# Patient Record
Sex: Male | Born: 1950 | Race: White | Hispanic: No | Smoking: Former smoker
Health system: Southern US, Community
[De-identification: ages and names within clinical notes are randomized; demographics above are authoritative.]

## PROBLEM LIST (undated history)

## (undated) DIAGNOSIS — K219 Gastro-esophageal reflux disease without esophagitis: Secondary | ICD-10-CM

## (undated) DIAGNOSIS — I700 Atherosclerosis of aorta: Secondary | ICD-10-CM

## (undated) DIAGNOSIS — K449 Diaphragmatic hernia without obstruction or gangrene: Secondary | ICD-10-CM

## (undated) DIAGNOSIS — K259 Gastric ulcer, unspecified as acute or chronic, without hemorrhage or perforation: Secondary | ICD-10-CM

## (undated) DIAGNOSIS — J90 Pleural effusion, not elsewhere classified: Secondary | ICD-10-CM

## (undated) DIAGNOSIS — J439 Emphysema, unspecified: Secondary | ICD-10-CM

## (undated) DIAGNOSIS — J45909 Unspecified asthma, uncomplicated: Secondary | ICD-10-CM

## (undated) DIAGNOSIS — K269 Duodenal ulcer, unspecified as acute or chronic, without hemorrhage or perforation: Secondary | ICD-10-CM

## (undated) DIAGNOSIS — K220 Achalasia of cardia: Secondary | ICD-10-CM

## (undated) DIAGNOSIS — K635 Polyp of colon: Secondary | ICD-10-CM

## (undated) HISTORY — DX: Pleural effusion, not elsewhere classified: J90

## (undated) HISTORY — DX: Diaphragmatic hernia without obstruction or gangrene: K44.9

## (undated) HISTORY — DX: Duodenal ulcer, unspecified as acute or chronic, without hemorrhage or perforation: K26.9

## (undated) HISTORY — DX: Polyp of colon: K63.5

## (undated) HISTORY — DX: Emphysema, unspecified: J43.9

## (undated) HISTORY — DX: Achalasia of cardia: K22.0

## (undated) HISTORY — DX: Atherosclerosis of aorta: I70.0

---

## 2012-07-13 ENCOUNTER — Emergency Department (HOSPITAL_COMMUNITY): Payer: PRIVATE HEALTH INSURANCE

## 2012-07-13 ENCOUNTER — Emergency Department (HOSPITAL_COMMUNITY)
Admission: EM | Admit: 2012-07-13 | Discharge: 2012-07-13 | Disposition: A | Discharge status: Home / Self Care | Payer: PRIVATE HEALTH INSURANCE | Attending: Emergency Medicine | Admitting: Emergency Medicine

## 2012-07-13 ENCOUNTER — Encounter (HOSPITAL_COMMUNITY): Payer: Self-pay

## 2012-07-13 ENCOUNTER — Emergency Department (INDEPENDENT_AMBULATORY_CARE_PROVIDER_SITE_OTHER)
Admission: EM | Admit: 2012-07-13 | Discharge: 2012-07-13 | Disposition: A | Discharge status: Nursing Home (Includes ICF) | Payer: PRIVATE HEALTH INSURANCE | Source: Home / Self Care | Attending: Emergency Medicine | Admitting: Emergency Medicine

## 2012-07-13 ENCOUNTER — Encounter (HOSPITAL_COMMUNITY): Payer: Self-pay | Admitting: Emergency Medicine

## 2012-07-13 DIAGNOSIS — K625 Hemorrhage of anus and rectum: Secondary | ICD-10-CM

## 2012-07-13 DIAGNOSIS — K220 Achalasia of cardia: Secondary | ICD-10-CM | POA: Insufficient documentation

## 2012-07-13 DIAGNOSIS — K2970 Gastritis, unspecified, without bleeding: Secondary | ICD-10-CM | POA: Insufficient documentation

## 2012-07-13 DIAGNOSIS — R109 Unspecified abdominal pain: Secondary | ICD-10-CM

## 2012-07-13 DIAGNOSIS — R160 Hepatomegaly, not elsewhere classified: Secondary | ICD-10-CM

## 2012-07-13 DIAGNOSIS — K6289 Other specified diseases of anus and rectum: Secondary | ICD-10-CM

## 2012-07-13 HISTORY — DX: Gastric ulcer, unspecified as acute or chronic, without hemorrhage or perforation: K25.9

## 2012-07-13 LAB — APTT: aPTT: 33 seconds (ref 24–37)

## 2012-07-13 LAB — URINALYSIS, ROUTINE W REFLEX MICROSCOPIC
Hgb urine dipstick: NEGATIVE
Ketones, ur: 40 mg/dL — AB
Protein, ur: 30 mg/dL — AB
Urobilinogen, UA: 1 mg/dL (ref 0.0–1.0)

## 2012-07-13 LAB — URINE MICROSCOPIC-ADD ON

## 2012-07-13 LAB — COMPREHENSIVE METABOLIC PANEL
Alkaline Phosphatase: 59 U/L (ref 39–117)
BUN: 17 mg/dL (ref 6–23)
Chloride: 88 mEq/L — ABNORMAL LOW (ref 96–112)
GFR calc Af Amer: >90 mL/min (ref >90)
Glucose, Bld: 105 mg/dL — ABNORMAL HIGH (ref 70–99)
Potassium: 3.6 mEq/L (ref 3.5–5.1)
Total Bilirubin: 0.9 mg/dL (ref 0.3–1.2)

## 2012-07-13 LAB — CBC WITH DIFFERENTIAL/PLATELET
Eosinophils Absolute: 0.2 10*3/uL (ref 0.0–0.7)
HCT: 38.5 % — ABNORMAL LOW (ref 39.0–52.0)
Hemoglobin: 13.9 g/dL (ref 13.0–17.0)
Lymphs Abs: 2.2 10*3/uL (ref 0.7–4.0)
MCH: 29.7 pg (ref 26.0–34.0)
Monocytes Absolute: 1.6 10*3/uL — ABNORMAL HIGH (ref 0.1–1.0)
Monocytes Relative: 9 % (ref 3–12)
Neutro Abs: 15.1 10*3/uL — ABNORMAL HIGH (ref 1.7–7.7)
Neutrophils Relative %: 79 % — ABNORMAL HIGH (ref 43–77)
RBC: 4.68 MIL/uL (ref 4.22–5.81)

## 2012-07-13 LAB — LIPASE, BLOOD: Lipase: 15 U/L (ref 11–59)

## 2012-07-13 MED ORDER — FAMOTIDINE 20 MG PO TABS: 20.0000 mg | ORAL_TABLET | Freq: Two times a day (BID) | ORAL | Status: DC

## 2012-07-13 MED ORDER — OMEPRAZOLE 20 MG PO CPDR: 20.0000 mg | DELAYED_RELEASE_CAPSULE | Freq: Every day | ORAL | Status: DC

## 2012-07-13 MED ORDER — ONDANSETRON HCL 4 MG/2ML IJ SOLN
4.0000 mg | Freq: Once | INTRAMUSCULAR | Status: AC
  Administered 2012-07-13: 4 mg via INTRAVENOUS
  Filled 2012-07-13: qty 2

## 2012-07-13 MED ORDER — SODIUM CHLORIDE 0.9 % IV BOLUS (SEPSIS)
1000.0000 mL | Freq: Once | INTRAVENOUS | Status: AC
  Administered 2012-07-13: 1000 mL via INTRAVENOUS

## 2012-07-13 MED ORDER — HYDROCODONE-ACETAMINOPHEN 5-500 MG PO TABS: 1.0000 | ORAL_TABLET | Freq: Four times a day (QID) | ORAL | Status: DC | PRN

## 2012-07-13 MED ORDER — MORPHINE SULFATE 4 MG/ML IJ SOLN
4.0000 mg | Freq: Once | INTRAMUSCULAR | Status: DC
  Filled 2012-07-13: qty 1

## 2012-07-13 MED ORDER — PANTOPRAZOLE SODIUM 40 MG IV SOLR
40.0000 mg | Freq: Once | INTRAVENOUS | Status: AC
  Administered 2012-07-13: 40 mg via INTRAVENOUS
  Filled 2012-07-13: qty 40

## 2012-07-13 NOTE — ED Provider Notes (Addendum)
A will and is a 1 and a is an old or a History     CSN: [id number]  Arrival date & time 07/13/12  1219   First MD Initiated Contact with Patient 07/13/12 1240      Chief Complaint  Patient presents with  . Abdominal Pain    (Consider location/radiation/quality/duration/timing/severity/associated sxs/prior treatment) HPI Comments: Patient presents urgent care today complaining of moderate to severe abdominal pain for about 8 days. He has been noticing blood in his stools when he finishes for several weeks is not all the time comes and goes. Patient describes he smokes daily and drinks vodka and beer daily. Have been trying to tolerate this discomfort present within the last 2-3 days this has become worse and now has a constant dull pain on his upper abdomen (points to right upper quadrant and epigastric region). Denies any fevers but does feel nauseous. Have not been eating for several days. 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 Was attributing the pain to his standing position. Patient does relate that in the 70s he was diagnosed with a large gastric ulcer through an endoscopic study.  Patient describes that he feels tired fatigued no appetite and had lost about 5 pounds within the last week.  The history is provided by the patient.    Past Medical History  Diagnosis Date  . Stomach ulcer     History reviewed. No pertinent past surgical history.  No family history on file.  History  Substance Use Topics  . Smoking status: Current Every Day Smoker -- 1.0 packs/day    Types: Cigarettes  . Smokeless tobacco: Not on file  . Alcohol Use: Yes      Review of Systems  Constitutional: Positive for activity change, appetite change, fatigue and unexpected weight change. Negative for fever and diaphoresis.  Respiratory: Negative for shortness of breath.   Gastrointestinal: Positive for nausea, abdominal pain, blood in stool and anal bleeding.  Negative for vomiting, diarrhea, constipation and rectal pain.  Neurological: Negative for dizziness.    Allergies  Review of patient's allergies indicates no known allergies.  Home Medications   Current Outpatient Rx  Name Route Sig Dispense Refill  . IBUPROFEN PO Oral Take by mouth.      BP 130/79  Pulse 64  Temp 98.1 F (36.7 C) (Oral)  Resp 18  SpO2 100%  Physical Exam  Nursing note and vitals reviewed. Constitutional: Vital signs are normal. He appears well-developed and well-nourished.  Non-toxic appearance. He does not have a sickly appearance. He does not appear ill. No distress.  HENT:  Head: Normocephalic.  Eyes: Right eye exhibits discharge. Left eye exhibits no discharge.  Neck: Neck supple.  Cardiovascular: Normal rate.  Exam reveals no gallop and no friction rub.   No murmur heard. Pulmonary/Chest: Effort normal and breath sounds normal.  Abdominal: Normal appearance. He exhibits no shifting dullness, no pulsatile liver, no abdominal bruit and no ascites. There is hepatomegaly. There is tenderness in the right upper quadrant and epigastric area. There is no rigidity, no rebound, no guarding and negative Murphy's sign. No hernia. Hernia confirmed negative in the ventral area.    Neurological: He is alert.  Skin: Skin is warm.    ED Course  Procedures (including critical care time)  Labs Reviewed - No data to display No results found.   No diagnosis found. Patient had a positive Hemoccult test at [facility] Patient had an EKG showing  ventricular rate of 55 beats per minute sinus bradycardia with a right axis deviation. No signs of acute or chronic ischemia and no with normal PR interval or QT.   MDM  Patient has been transfer stable to the emergency department possibly alcohol induced hepatobiliary disorder to be further evaluated in emergency department. To be considered for imaging studies and comprehensive labs. Patient does not have a PCP and has concerning  hepatomegaly and tenderness. Differential includes cirrhosis, pancreatitis, alcohol induced hepatitis, etc.        Jimmie Molly, MD 07/13/12 1403  Jimmie Molly, MD 07/13/12 1407

## 2012-07-13 NOTE — ED Notes (Signed)
Pt transferred from and Report received from [facility].  Pt having intermittent abdominal pain and tenderness. Pt has Hx. of peptic ulcer.

## 2012-07-13 NOTE — ED Notes (Signed)
Patient drinks one beer in the am three shots of vodka day and one beer at night for a long time.

## 2012-07-13 NOTE — ED Notes (Signed)
Patient states past week has not ate normally due to lack of appetite and pain. Lost approximately 5-10 pounds this week.

## 2012-07-13 NOTE — ED Provider Notes (Signed)
History     CSN: [id number]  Arrival date & time 07/13/12  1422   First MD Initiated Contact with Patient 07/13/12 1702      Chief Complaint  Patient presents with  . Chest Pain    (Consider location/radiation/quality/duration/timing/severity/associated sxs/prior treatment) HPI Comments: Otho Rodela is a 61 y.o. Male who presents with complaint of upper abdominal pain for about a week now. Stats pain comes and goes but most recently constant. Associated with nausea, loss of appetite. Pt denies emesis, no blood in stool, stools are brown in color. States has history of ulcers years ago. States he is an alcohol drinker, drinks every evening and one drink every morning. States has been doing that "for years." Denies any other medical problems. Does not have a PCP. Denies fever, chills, cough, chest pain, swellng in legs.   The history is provided by the patient.    Past Medical History  Diagnosis Date  . Stomach ulcer     No past surgical history on file.  No family history on file.  History  Substance Use Topics  . Smoking status: Current Every Day Smoker -- 1.0 packs/day    Types: Cigarettes  . Smokeless tobacco: Not on file  . Alcohol Use: Yes      Review of Systems  Constitutional: Negative for fever and chills.  HENT: Negative for neck pain and neck stiffness.   Respiratory: Negative for cough, chest tightness, shortness of breath and wheezing.   Cardiovascular: Positive for chest pain. Negative for palpitations and leg swelling.  Gastrointestinal: Positive for nausea and abdominal pain. Negative for vomiting, diarrhea, constipation and blood in stool.  Genitourinary: Negative for dysuria, urgency, frequency and flank pain.  Musculoskeletal: Negative.   Skin: Negative.   Neurological: Positive for weakness. Negative for dizziness and headaches.  Hematological: Negative for adenopathy. Does not bruise/bleed easily.    Allergies  Review of patient's allergies  indicates no known allergies.  Home Medications   Current Outpatient Rx  Name Route Sig Dispense Refill  . VITAMIN B 12 PO Oral Take 1 tablet by mouth daily.    . IBUPROFEN 200 MG PO TABS Oral Take 600 mg by mouth 2 (two) times daily.    Marland Kitchen FISH OIL PO Oral Take 1 capsule by mouth daily.      BP 130/82  Pulse 75  Temp 98 F (36.7 C) (Oral)  Resp 16  SpO2 100%  Physical Exam  Nursing note and vitals reviewed. Constitutional: He is oriented to person, place, and time. He appears well-developed.       Thin appearing  HENT:  Head: Normocephalic and atraumatic.  Eyes: Conjunctivae normal are normal.  Neck: Normal range of motion. Neck supple.  Cardiovascular: Normal rate, regular rhythm and normal heart sounds.   Pulmonary/Chest: Effort normal. No respiratory distress. He has no wheezes. He has no rales.  Abdominal: Soft. Bowel sounds are normal. He exhibits no distension. There is tenderness. There is no rebound and no guarding.       Epigastric tenderness.   Genitourinary:       Normal rectal exam except for an internal and non thrombosed not bleeding external hemorrhoid. Stool is brown in color  Musculoskeletal: He exhibits no edema.  Neurological: He is alert and oriented to person, place, and time.  Skin: Skin is warm and dry.  Psychiatric: He has a normal mood and affect.    ED Course  Procedures (including critical care time)      Results  for orders placed during the hospital encounter of 07/13/12  CBC WITH DIFFERENTIAL      Component Value Range   WBC 19.1 (*) 4.0 - 10.5 K/uL   RBC 4.68  4.22 - 5.81 MIL/uL   Hemoglobin 13.9  13.0 - 17.0 g/dL   HCT 16.1 (*) 09.6 - 04.5 %   MCV 82.3  78.0 - 100.0 fL   MCH 29.7  26.0 - 34.0 pg   MCHC 36.1 (*) 30.0 - 36.0 g/dL   RDW 40.9  81.1 - 91.4 %   Platelets 346  150 - 400 K/uL   Neutrophils Relative 79 (*) 43 - 77 %   Neutro Abs 15.1 (*) 1.7 - 7.7 K/uL   Lymphocytes Relative 11 (*) 12 - 46 %   Lymphs Abs 2.2  0.7 - 4.0  K/uL   Monocytes Relative 9  3 - 12 %   Monocytes Absolute 1.6 (*) 0.1 - 1.0 K/uL   Eosinophils Relative 1  0 - 5 %   Eosinophils Absolute 0.2  0.0 - 0.7 K/uL   Basophils Relative 0  0 - 1 %   Basophils Absolute 0.1  0.0 - 0.1 K/uL  COMPREHENSIVE METABOLIC PANEL      Component Value Range   Sodium 126 (*) 135 - 145 mEq/L   Potassium 3.6  3.5 - 5.1 mEq/L   Chloride 88 (*) 96 - 112 mEq/L   CO2 25  19 - 32 mEq/L   Glucose, Bld 105 (*) 70 - 99 mg/dL   BUN 17  6 - 23 mg/dL   Creatinine, Ser 7.82  0.50 - 1.35 mg/dL   Calcium 9.8  8.4 - 95.6 mg/dL   Total Protein 7.4  6.0 - 8.3 g/dL   Albumin 3.8  3.5 - 5.2 g/dL   AST 18  0 - 37 U/L   ALT 15  0 - 53 U/L   Alkaline Phosphatase 59  39 - 117 U/L   Total Bilirubin 0.9  0.3 - 1.2 mg/dL   GFR calc non Af Amer >90  >90 mL/min   GFR calc Af Amer >90  >90 mL/min  LIPASE, BLOOD      Component Value Range   Lipase 15  11 - 59 U/L  TROPONIN I      Component Value Range   Troponin I <0.30  <0.30 ng/mL  URINALYSIS, ROUTINE W REFLEX MICROSCOPIC      Component Value Range   Color, Urine YELLOW  YELLOW   APPearance CLEAR  CLEAR   Specific Gravity, Urine 1.031 (*) 1.005 - 1.030   pH 6.0  5.0 - 8.0   Glucose, UA NEGATIVE  NEGATIVE mg/dL   Hgb urine dipstick NEGATIVE  NEGATIVE   Bilirubin Urine SMALL (*) NEGATIVE   Ketones, ur 40 (*) NEGATIVE mg/dL   Protein, ur 30 (*) NEGATIVE mg/dL   Urobilinogen, UA 1.0  0.0 - 1.0 mg/dL   Nitrite NEGATIVE  NEGATIVE   Leukocytes, UA NEGATIVE  NEGATIVE  OCCULT BLOOD, POC DEVICE      Component Value Range   Fecal Occult Bld POSITIVE    URINE MICROSCOPIC-ADD ON      Component Value Range   WBC, UA 0-2  <3 WBC/hpf   RBC / HPF 0-2  <3 RBC/hpf   Casts HYALINE CASTS (*) NEGATIVE   Urine-Other MUCOUS PRESENT     Dg Chest 2 View  07/13/2012  *RADIOLOGY REPORT*  Clinical Data: Chest pain.  Upper abdominal pain.  CHEST -  2 VIEW  Comparison: 07/13/2012 abdomen radiograph  Findings: Markedly dilated esophagus with  air-fluid level noted.  I do not observe clips in the chest to suggest that the patient has alternatively had a gastric pull-through procedure.  Cardiac and mediastinal contours appear unremarkable.  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  IMPRESSION:  1. Large middle mediastinal structure with air fluid level likely representing a prominently dilated esophagus.  Appearance suspicious for achalasia. 2.  Suspected emphysema.   Original Report Authenticated By: Dellia Cloud, M.D.    Dg Abd 2 Views  07/13/2012  *RADIOLOGY REPORT*  Clinical Data: Upper abdominal pain.  ABDOMEN - 2 VIEW  Comparison: None.  Findings: Large gas and fluid filled structure in the middle mediastinum is partially included, suspicious for markedly dilated esophagus in the setting of achalasia.  Mild dextroconvex lumbar scoliosis is observed.  Equivocal hepatomegaly.  Unremarkable bowel gas pattern.  IMPRESSION:  1.  Dilated esophagus filled with gas and fluid, suspicious for achalasia. 2.  Dextroconvex lumbar scoliosis. 3.  Probable mild hepatomegaly.   Original Report Authenticated By: Dellia Cloud, M.D.      1. Gastritis   2. Achalasia       MDM  Pt with possible achalasia on x-rays, and i suspect some aspect of alcoholic gastritis. His LFTs and lipase are negative. Stool normal in color. Hemoglobin normal, hemoccult positive stool, however, Just a tiny blue spec on the development paper. Pt in NAD. VS normal. He is non toxic. Abdomen soft, no acute abdomen. No signs of perforated ulcer on acute abdomen. Will start on PPI, pepcid, follow up with GI for further evaluation. Instructed to return if worsening.         Lottie Mussel, PA 07/14/12 0104

## 2012-07-13 NOTE — ED Notes (Signed)
Pt sent here from [facility] for rib pain and abd pain, pt denies hx of pancreatitis but sts has been consuming etoh.

## 2012-07-13 NOTE — ED Notes (Signed)
Pt c/o of abd pain x8 days.... Believes it may be due to his stomach ulcers... Pain starts at the stomach radiating towards ribs and back... Not eating well or sleeping well due to the pain... Only able to keep fluids down and milk helps to relief some discomfort... Feeling nauseas and vomits when he does manage to eat... Denies: fever and diarrhea... 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

## 2012-07-14 NOTE — ED Provider Notes (Signed)
Medical screening examination/treatment/procedure(s) were performed by non-physician practitioner and as supervising physician I was immediately available for consultation/collaboration.  Flint Melter, MD 07/14/12 1312

## 2012-07-20 ENCOUNTER — Encounter: Payer: Self-pay | Admitting: Internal Medicine

## 2012-08-13 ENCOUNTER — Encounter: Payer: Self-pay | Admitting: Internal Medicine

## 2012-08-15 ENCOUNTER — Ambulatory Visit (INDEPENDENT_AMBULATORY_CARE_PROVIDER_SITE_OTHER): Payer: PRIVATE HEALTH INSURANCE | Admitting: Internal Medicine

## 2012-08-15 ENCOUNTER — Other Ambulatory Visit: Payer: Self-pay | Admitting: Gastroenterology

## 2012-08-15 ENCOUNTER — Encounter: Payer: Self-pay | Admitting: Internal Medicine

## 2012-08-15 VITALS — BP 104/66 | HR 84 | Ht 64.0 in | Wt 120.2 lb

## 2012-08-15 DIAGNOSIS — R933 Abnormal findings on diagnostic imaging of other parts of digestive tract: Secondary | ICD-10-CM

## 2012-08-15 DIAGNOSIS — R1013 Epigastric pain: Secondary | ICD-10-CM

## 2012-08-15 DIAGNOSIS — K279 Peptic ulcer, site unspecified, unspecified as acute or chronic, without hemorrhage or perforation: Secondary | ICD-10-CM

## 2012-08-15 DIAGNOSIS — Z1211 Encounter for screening for malignant neoplasm of colon: Secondary | ICD-10-CM

## 2012-08-15 DIAGNOSIS — R1319 Other dysphagia: Secondary | ICD-10-CM

## 2012-08-15 DIAGNOSIS — R1314 Dysphagia, pharyngoesophageal phase: Secondary | ICD-10-CM

## 2012-08-15 DIAGNOSIS — R1310 Dysphagia, unspecified: Secondary | ICD-10-CM

## 2012-08-15 MED ORDER — PEG-KCL-NACL-NASULF-NA ASC-C 100 G PO SOLR: 1.0000 | Freq: Once | ORAL | Status: DC

## 2012-08-15 MED ORDER — FAMOTIDINE 20 MG PO TABS: 20.0000 mg | ORAL_TABLET | Freq: Two times a day (BID) | ORAL | Status: DC

## 2012-08-15 NOTE — Patient Instructions (Addendum)
You have been given a separate informational sheet regarding your tobacco use, the importance of quitting and local resources to help you quit. You have been scheduled for a Endoscopy/colonoscopy with propofol. Please follow written instructions given to you at your visit today.  Please pick up your prep kit at the pharmacy within the next 1-3 days. If you use inhalers (even only as needed), please bring them with you on the day of your procedure. We have sent the following medications to your pharmacy for you to pick up at your convenience: moviprep, pepcid

## 2012-08-15 NOTE — Progress Notes (Signed)
Patient ID: Raymond Jacobs, male   DOB: [birth year], 61 y.o.   MRN: [id number]  SUBJECTIVE: HPI Raymond Jacobs is a 61 yo male with PMH of PUD who is seen in consultation at the request of Dr. Effie Shy (Urgent Care MD) for evaluation of epigastric abdominal pain, reflux and abnormal imaging.  The patient reports being seen in urgent care around 07/13/2012 for evaluation of bilateral rib and epigastric abdominal pain. This had been going on for several weeks but worsened and he sought treatment. At that time he was having some trouble swallowing both solid and liquid, but perhaps worse for liquids. Issues with dysphagia had been present on and off for a few "years".  He also is having some nausea but no vomiting. 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  He was given a prescription for famotidine 20 mg twice daily at that appointment and he has continue this as prescribed. He has significantly improved his epigastric abdominal pain as well as his rib pain. His nausea has resolved and he feels that his swallowing has improved. He still occasionally has epigastric pain but it is not as severe. He also is cut back on coffee and caffeine consumption which he thinks has helped. His bowel movements have been regular without diarrhea or constipation. He has seen scant red blood on the toilet tissue but he associated this with ibuprofen use, discontinued ibuprofen, and it resolved.  He's never had an endoscopic procedure, but did have what sounds like a barium swallow when he was 61 years old a diagnosed ulcer disease.  His brother has a history of colorectal cancer and had a partial colectomy.  Review of Systems  As per history of present illness, otherwise negative   Past Medical History  Diagnosis Date  . Stomach ulcer     Current Outpatient Prescriptions  Medication Sig Dispense Refill  . acetaminophen (TYLENOL) 500 MG tablet Take 500 mg by mouth every 6 (six) hours  as needed.      . Cyanocobalamin (VITAMIN B 12 PO) Take 1 tablet by mouth daily as needed.       . famotidine (PEPCID) 20 MG tablet Take 1 tablet (20 mg total) by mouth 2 (two) times daily.  30 tablet  0  . Omega-3 Fatty Acids (FISH OIL PO) Take 1 capsule by mouth daily as needed.       Marland Kitchen DISCONTD: famotidine (PEPCID) 20 MG tablet Take 1 tablet (20 mg total) by mouth 2 (two) times daily.  30 tablet  0  . peg 3350 powder (MOVIPREP) 100 G SOLR Take 1 kit (100 g total) by mouth once.  1 kit  0    No Known Allergies  Family History  Problem Relation Age of Onset  . Colon cancer Brother   . Diabetes Brother   . Diabetes Maternal Aunt     History  Substance Use Topics  . Smoking status: Current Every Day Smoker -- 1.0 packs/day    Types: Cigarettes  . Smokeless tobacco: Never Used   Comment: form given 08-15-12  . Alcohol Use: Yes     1 beer and 3 shots vodka daily    OBJECTIVE: BP 104/66  Pulse 84  Ht 5\' 4"  (1.626 m)  Wt 120 lb 3.2 oz (54.522 kg)  BMI 20.63 kg/m2 Constitutional: Well-developed and well-nourished. No distress. HEENT: Normocephalic and atraumatic. Oropharynx is clear and moist. No oropharyngeal exudate. Conjunctivae are normal. No scleral icterus. Neck: Neck  supple. Trachea midline. Cardiovascular: Normal rate, regular rhythm and intact distal pulses. No M/R/G Pulmonary/chest: Effort normal and breath sounds somewhat distant. No wheezing, rales or rhonchi. Abdominal: Soft, nontender, nondistended. Bowel sounds active throughout. There are no masses palpable. No hepatosplenomegaly. Extremities: no clubbing, cyanosis, or edema Lymphadenopathy: No cervical adenopathy noted. Neurological: Alert and oriented to person place and time. Skin: Skin is warm and dry. No rashes noted. Psychiatric: Normal mood and affect. Behavior is normal.  Labs and Imaging -- CBC    Component Value Date/Time   WBC 19.1* 07/13/2012 1502   RBC 4.68 07/13/2012 1502   HGB 13.9 07/13/2012  1502   HCT 38.5* 07/13/2012 1502   PLT 346 07/13/2012 1502   MCV 82.3 07/13/2012 1502   MCH 29.7 07/13/2012 1502   MCHC 36.1* 07/13/2012 1502   RDW 13.9 07/13/2012 1502   LYMPHSABS 2.2 07/13/2012 1502   MONOABS 1.6* 07/13/2012 1502   EOSABS 0.2 07/13/2012 1502   BASOSABS 0.1 07/13/2012 1502    CMP     Component Value Date/Time   NA 126* 07/13/2012 1502   K 3.6 07/13/2012 1502   CL 88* 07/13/2012 1502   CO2 25 07/13/2012 1502   GLUCOSE 105* 07/13/2012 1502   BUN 17 07/13/2012 1502   CREATININE 0.60 07/13/2012 1502   CALCIUM 9.8 07/13/2012 1502   PROT 7.4 07/13/2012 1502   ALBUMIN 3.8 07/13/2012 1502   AST 18 07/13/2012 1502   ALT 15 07/13/2012 1502   ALKPHOS 59 07/13/2012 1502   BILITOT 0.9 07/13/2012 1502   GFRNONAA >90 07/13/2012 1502   GFRAA >90 07/13/2012 1502   Imaging from 07/13/2012 --  Clinical Data: Chest pain.  Upper abdominal pain.   CHEST - 2 VIEW   Comparison: 07/13/2012 abdomen radiograph   Findings: Markedly dilated esophagus with air-fluid level noted.  I do not observe clips in the chest to suggest that the patient has alternatively had a gastric pull-through procedure.   Cardiac and mediastinal contours appear unremarkable.   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   IMPRESSION:   1. Large middle mediastinal structure with air fluid level likely representing a prominently dilated esophagus.  Appearance suspicious for achalasia. 2.  Suspected emphysema. Clinical Data: Upper abdominal pain.   ABDOMEN - 2 VIEW   Comparison: None.   Findings: Large gas and fluid filled structure in the middle mediastinum is partially included, suspicious for markedly dilated esophagus in the setting of achalasia.   Mild dextroconvex lumbar scoliosis is observed.  Equivocal hepatomegaly.  Unremarkable bowel gas pattern.   IMPRESSION:   1.  Dilated esophagus filled with gas and fluid, suspicious for achalasia. 2.  Dextroconvex lumbar  scoliosis. 3.  Probable mild hepatomegaly.  ASSESSMENT AND PLAN: 61 yo male with PMH of PUD who is seen in consultation at the request of Dr. Effie Shy (Urgent Care MD) for evaluation of epigastric abdominal pain, reflux and abnormal imaging  1.  GERD/epigastric pain/abnl imaging -- patient has had a favorable response to the famotidine 20 mg twice a day. However given his long-term symptoms of dysphagia and abnormal imaging, I recommended direct visualization with upper endoscopy. The chest x-ray showed a dilated esophagus which is suspicious for achalasia.  I will determine the need for manometry after her EGD. Her now he'll continue famotidine 20 mg twice daily. I've asked that he continue to avoid NSAIDs.  2.  CRC screening/scant rectal bleeding (resolved) -- given the patient's age, the fact that he has never had  colonoscopy, and his brothers history recommended screening colonoscopy today. We discussed the risk and benefits of both procedures and he is agreeable to proceed. We will be performed on the same day.  Further recommendations after procedure. He did have a hyponatremia and leukocytosis on September 20 at his urgent care visit and we will recheck these labs.

## 2012-08-21 ENCOUNTER — Ambulatory Visit (AMBULATORY_SURGERY_CENTER): Payer: PRIVATE HEALTH INSURANCE | Admitting: Internal Medicine

## 2012-08-21 ENCOUNTER — Encounter: Payer: Self-pay | Admitting: Internal Medicine

## 2012-08-21 VITALS — BP 122/83 | HR 68 | Temp 97.9°F | Resp 12 | Ht 64.0 in | Wt 120.0 lb

## 2012-08-21 DIAGNOSIS — K209 Esophagitis, unspecified: Secondary | ICD-10-CM

## 2012-08-21 DIAGNOSIS — D126 Benign neoplasm of colon, unspecified: Secondary | ICD-10-CM

## 2012-08-21 DIAGNOSIS — R1314 Dysphagia, pharyngoesophageal phase: Secondary | ICD-10-CM

## 2012-08-21 DIAGNOSIS — K2980 Duodenitis without bleeding: Secondary | ICD-10-CM

## 2012-08-21 DIAGNOSIS — K2970 Gastritis, unspecified, without bleeding: Secondary | ICD-10-CM

## 2012-08-21 DIAGNOSIS — R933 Abnormal findings on diagnostic imaging of other parts of digestive tract: Secondary | ICD-10-CM

## 2012-08-21 DIAGNOSIS — K635 Polyp of colon: Secondary | ICD-10-CM

## 2012-08-21 DIAGNOSIS — Z1211 Encounter for screening for malignant neoplasm of colon: Secondary | ICD-10-CM

## 2012-08-21 DIAGNOSIS — R1310 Dysphagia, unspecified: Secondary | ICD-10-CM

## 2012-08-21 MED ORDER — ESOMEPRAZOLE MAGNESIUM 40 MG PO PACK: 40.0000 mg | PACK | Freq: Two times a day (BID) | ORAL | Status: DC

## 2012-08-21 MED ORDER — SODIUM CHLORIDE 0.9 % IV SOLN: 500.0000 mL | INTRAVENOUS | Status: DC

## 2012-08-21 NOTE — Op Note (Signed)
[facility] [address] [place], [address]   ENDOSCOPY PROCEDURE REPORT  PATIENT: Raymond Jacobs, Raymond Jacobs  MR#: [id number] BIRTHDATE: [birth year] , 61  yrs. old GENDER: Male ENDOSCOPIST: Beverley Fiedler, MD REFERRED BY: PROCEDURE DATE:  08/21/2012 PROCEDURE:  EGD w/ biopsy ASA CLASS:     Class II INDICATIONS:  epigastric pain.   heartburn.   dysphagia.   abnormal chest X-ray. MEDICATIONS: MAC sedation, administered by CRNA and Propofol (Diprivan) 260 mg IV TOPICAL ANESTHETIC: Cetacaine Spray  DESCRIPTION OF PROCEDURE: After the risks benefits and alternatives of the procedure were thoroughly explained, informed consent was obtained.  The LB-GIF Q180 [id number] endoscope was introduced through the mouth and advanced to the second portion of the duodenum. Without limitations.  The instrument was slowly withdrawn as the mucosa was fully examined.    ESOPHAGUS:   Moderate to severe dilation throughout the entire course of the esophagus. The esophagus was fluid-filled and time was taken to aspirate all fluid.  The esophagus had mild and inflammatory changes and mucosal sloughing , likely as a consequence of stasis. Biopsies were performed with cold forceps. The  Z line was regular and located 40 cm from the incisors. Moderate resistance was encountered with passage of the scope across the GE junction/LES.  STOMACH: Moderate gastritis (inflammation) with hemorrhage was found in the gastric body and gastric antrum.  Biopsies were taken in the gastric body, antrum and angularis.  DUODENUM: Moderate duodenal inflammation was found in the duodenal bulb.   The duodenal mucosa showed no abnormalities in the 2nd part of the duodenum.  Retroflexed views revealed no abnormalities. The scope was then withdrawn from the patient and the procedure completed.  COMPLICATIONS: There were no complications.  ENDOSCOPIC IMPRESSION:  1.    Dilated and fluid-filled esophagus with  increased tone at the GE junction consistent with achalasia 2.   Gastritis (inflammation) with hemorrhage was found in the gastric body and gastric antrum; biopsies were taken in the antrum and angularis 2.   Duodenal inflammation was found in the duodenal bulb 3.   The duodenal mucosa showed no abnormalities in the 2nd part of the duodenum  RECOMMENDATIONS: 1.  Await pathology results 2.  Begin Nexium packets 40 mg twice daily for acid suppression 3.  Referral to [facility], Dr.  Abbey Chatters for evaluation of achalasia and consideration of Heller myotomy  eSigned:  Beverley Fiedler, MD 08/21/2012 3:58 PM   CC:  PATIENT NAME:  Raymond Jacobs, Raymond Jacobs MR#: [id number]

## 2012-08-21 NOTE — Progress Notes (Signed)
Patient did not experience any of the following events: a burn prior to discharge; a fall within the facility; wrong site/side/patient/procedure/implant event; or a hospital transfer or hospital admission upon discharge from the facility. (G8907) Patient did not have preoperative order for IV antibiotic SSI prophylaxis. (G8918)  

## 2012-08-21 NOTE — Patient Instructions (Addendum)

## 2012-08-21 NOTE — Op Note (Signed)
[facility] [address] [place], [address]   COLONOSCOPY PROCEDURE REPORT  PATIENT: Raymond, Jacobs  MR#: [id number] BIRTHDATE: 1951-03-20 , 61  yrs. old GENDER: Male ENDOSCOPIST: Beverley Fiedler, MD PROCEDURE DATE:  08/21/2012 PROCEDURE:   Colonoscopy with snare polypectomy and Colonoscopy with cold biopsy polypectomy ASA CLASS:   Class II INDICATIONS:elevated risk screening and patient's immediate family history of colon cancer. MEDICATIONS: MAC sedation, administered by CRNA, Propofol (Diprivan), and Propofol (Diprivan) 120 mg IV  DESCRIPTION OF PROCEDURE:   After the risks benefits and alternatives of the procedure were thoroughly explained, informed consent was obtained.  A digital rectal exam revealed no rectal mass.   The LB CF-[id number]  endoscope was introduced through the anus and advanced to the cecum, which was identified by both the appendix and ileocecal valve. No adverse events experienced. The quality of the prep was good, using MoviPrep  The instrument was then slowly withdrawn as the colon was fully examined.    COLON FINDINGS: Two sessile polyps ranging between 3-81mm in size were found in the sigmoid colon.  Polypectomy was performed using cold snare and with cold forceps.  All resections were complete and all polyp tissue was completely retrieved.   Mild diverticulosis was noted in the sigmoid colon.  Retroflexed views revealed no abnormalities. The time to cecum=6 minutes 56 seconds.  Withdrawal time=10 minutes 33 seconds.  The scope was withdrawn and the procedure completed. COMPLICATIONS: There were no complications.  ENDOSCOPIC IMPRESSION: 1.   Two sessile polyps ranging between 3-42mm in size were found in the sigmoid colon; Polypectomy was performed using cold snare and with cold forceps 2.   Mild diverticulosis was noted in the sigmoid colon  RECOMMENDATIONS: 1.  Await pathology results 2.  High fiber diet 3.  If the  polyps removed today are proven to be adenomatous (pre-cancerous) polyps, you will need a repeat colonoscopy in 5 years.  Otherwise you should continue to follow colorectal cancer screening guidelines for "routine risk" patients with colonoscopy in 10 years.  You will receive a letter within 1-2 weeks with the results of your biopsy as well as final recommendations.  Please call my office if you have not received a letter after 3 weeks.   eSigned:  Beverley Fiedler, MD 08/21/2012 4:00 PM  cc: The Patient   PATIENT NAME:  Raymond, Jacobs MR#: [id number]

## 2012-08-21 NOTE — Progress Notes (Signed)
VSS, A&O, report to RN 

## 2012-08-22 ENCOUNTER — Telehealth: Payer: Self-pay | Admitting: *Deleted

## 2012-08-22 NOTE — Telephone Encounter (Signed)
  Follow up Call-  Call back number 08/21/2012  Post procedure Call Back phone  # [phone]  Permission to leave phone message No    Voicemail not set up yet; not able to leave message

## 2012-08-28 ENCOUNTER — Encounter: Payer: Self-pay | Admitting: Internal Medicine

## 2012-08-28 ENCOUNTER — Telehealth: Payer: Self-pay | Admitting: *Deleted

## 2012-08-28 DIAGNOSIS — K220 Achalasia of cardia: Secondary | ICD-10-CM

## 2012-08-28 NOTE — Telephone Encounter (Signed)
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 Sent note to [place] at [facility] for an appt.

## 2012-08-29 NOTE — Telephone Encounter (Signed)
Patient scheduled to see Dr. Avel Peace on 09/26/12 @ 9:20am, arrive @ 8:50am. If you have any questions please call [phone].   Spoke with pt to inform him I have his appt with a surgeon and he stated he wanted to wait on the surgeon's consultation and the surgery. Pt states he was starting to feel when he came to see Dr Rhea Belton, but he thought he would continue with the evaluation. He states he's improving every day; he's moving his ladders,working and riding his bicycle. He also reports he did not purchase Nexium because it's too expensive and he feels he doesn't need it. He uses Pepcid or Prilosec about QOD.  Cancelled appt with Dr Abbey Chatters.

## 2012-08-30 DIAGNOSIS — K220 Achalasia of cardia: Secondary | ICD-10-CM | POA: Insufficient documentation

## 2012-08-30 NOTE — Telephone Encounter (Signed)
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 I'm not surprised that acid suppression helps his symptoms. While I feel that he needs to see surgery about this issue, we cannot force him to do so. He can followup with [place] if he has symptoms of dysphagia, odynophagia, or dyspeptic symptoms

## 2012-09-26 ENCOUNTER — Ambulatory Visit (INDEPENDENT_AMBULATORY_CARE_PROVIDER_SITE_OTHER): Payer: PRIVATE HEALTH INSURANCE | Admitting: General Surgery

## 2012-10-22 ENCOUNTER — Ambulatory Visit (INDEPENDENT_AMBULATORY_CARE_PROVIDER_SITE_OTHER): Payer: Self-pay | Admitting: General Surgery

## 2013-05-16 ENCOUNTER — Emergency Department (INDEPENDENT_AMBULATORY_CARE_PROVIDER_SITE_OTHER)
Admission: EM | Admit: 2013-05-16 | Discharge: 2013-05-16 | Disposition: A | Discharge status: Home / Self Care | Payer: PRIVATE HEALTH INSURANCE | Source: Home / Self Care

## 2013-05-16 ENCOUNTER — Encounter (HOSPITAL_COMMUNITY): Payer: Self-pay | Admitting: Emergency Medicine

## 2013-05-16 DIAGNOSIS — L253 Unspecified contact dermatitis due to other chemical products: Secondary | ICD-10-CM

## 2013-05-16 MED ORDER — CLOBETASOL PROPIONATE 0.05 % EX OINT: TOPICAL_OINTMENT | Freq: Two times a day (BID) | CUTANEOUS | Status: DC

## 2013-05-16 MED ORDER — LANOLIN EX OINT: TOPICAL_OINTMENT | CUTANEOUS | Status: DC | PRN

## 2013-05-16 NOTE — ED Notes (Addendum)
Pt's feet are soaking in sterile water at this time, to aid in the removal of his bandages.

## 2013-05-16 NOTE — ED Notes (Signed)
Patient reports he was pressure washing a house with clorox solution and feet and hands were not protected.  Since then he has had issues.  Hands /wrists are red.  Bilateral feet are swollen, red, and splotchy white tissue.  Bilateral feet weeping.  Odor noted.  Patient denies pain.  Patient has been using neosporin on wounds.  Bilateral pedal pulses present.

## 2013-05-16 NOTE — ED Provider Notes (Signed)
Medical screening examination/treatment/procedure(s) were performed by resident physician or non-physician practitioner and as supervising physician I was immediately available for consultation/collaboration.   Barkley Bruns MD.   Linna Hoff, MD 05/16/13 [phone]

## 2013-05-16 NOTE — ED Provider Notes (Signed)
CSN: [id number]     Arrival date & time 05/16/13  1511 History     None    Chief Complaint  Patient presents with  . 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 He has since developed erythema and pitting edema to see as well as a dermatitis. Hands are also affected.   Past Medical History  Diagnosis Date  . Stomach ulcer    History reviewed. No pertinent past surgical history. Family History  Problem Relation Age of Onset  . Colon cancer Brother   . Diabetes Brother   . Diabetes Maternal Aunt    History  Substance Use Topics  . Smoking status: Current Every Day Smoker -- 1.00 packs/day    Types: Cigarettes  . Smokeless tobacco: Never Used     Comment: form given 08-15-12  . Alcohol Use: Yes     Comment: 1 beer and 3 shots vodka daily    Review of Systems  Skin: Positive for rash.       As per history of present illness  All other systems reviewed and are negative.    Allergies  Review of patient's allergies indicates no known allergies.  Home Medications   Current Outpatient Rx  Name  Route  Sig  Dispense  Refill  . ibuprofen (ADVIL,MOTRIN) 200 MG tablet   Oral   Take 200 mg by mouth every 6 (six) hours as needed for pain.         Marland Kitchen acetaminophen (TYLENOL) 500 MG tablet   Oral   Take 500 mg by mouth every 6 (six) hours as needed.         . clobetasol ointment (TEMOVATE) 0.05 %   Topical   Apply topically 2 (two) times daily.   30 g   1   . Cyanocobalamin (VITAMIN B 12 PO)   Oral   Take 1 tablet by mouth daily as needed.          Marland Kitchen esomeprazole (NEXIUM) 40 MG packet   Oral   Take 40 mg by mouth 2 (two) times daily.   30 each   2   . famotidine (PEPCID) 20 MG tablet   Oral   Take 1 tablet (20  mg total) by mouth 2 (two) times daily.   30 tablet   0   . lanolin ointment   Topical   Apply topically as needed for dry skin.   454 g   0   . Omega-3 Fatty Acids (FISH OIL PO)   Oral   Take 1 capsule by mouth daily as needed.           BP 102/66  Pulse 82  Temp(Src) 97.5 F (36.4 C) (Oral)  Resp 18  SpO2 98% Physical Exam  Nursing note and vitals reviewed. Constitutional: He is oriented to person, place, and time. He appears well-developed and well-nourished. No distress.  Pulmonary/Chest: Effort normal.  Neurological: He is alert and oriented to person, place, and time.  Skin:  Erythema, lichenification, skin fissures and mild swelling to both hands palmar and dorsal aspect.  There is mild erythema to the dorsum of the feet with 1+ pitting edema to just above the ankles. The  affected area stops at the level of the socks. the toes and plantar aspect of the feet are edematous and erythematous.  There is also lichenification and fissures in these areas as well. Does not appear to have areas of infection. No lymphangitis. Erythema appears to be due to the dermatitis affect.   Psychiatric: He has a normal mood and affect.    ED Course   Procedures (including critical care time)  Labs Reviewed - No data to display No results found. 1. Dermatitis, chemical     MDM  Wash feet in running water daily to clear old tissue Apply the clobetasol oint Apply lanolin oint daily RTO 1 week Prior to going home with a wrap both feet and clean and he is to take going off when he gets home to help remove some of the tissue bandage and any desquamating skin as well. Initial treat as above.  Hayden Rasmussen, NP 05/16/13 [phone]

## 2018-03-30 ENCOUNTER — Emergency Department (HOSPITAL_COMMUNITY): Payer: Medicare Other

## 2018-03-30 ENCOUNTER — Other Ambulatory Visit: Payer: Self-pay

## 2018-03-30 ENCOUNTER — Encounter (HOSPITAL_COMMUNITY): Payer: Self-pay | Admitting: *Deleted

## 2018-03-30 ENCOUNTER — Inpatient Hospital Stay (HOSPITAL_COMMUNITY)
Admission: EM | Admit: 2018-03-30 | Discharge: 2018-04-13 | DRG: 380 | Disposition: A | Discharge status: Home Health | Payer: Medicare Other | Attending: Surgery | Admitting: Surgery

## 2018-03-30 DIAGNOSIS — R0603 Acute respiratory distress: Secondary | ICD-10-CM | POA: Diagnosis not present

## 2018-03-30 DIAGNOSIS — R443 Hallucinations, unspecified: Secondary | ICD-10-CM | POA: Diagnosis not present

## 2018-03-30 DIAGNOSIS — Z8711 Personal history of peptic ulcer disease: Secondary | ICD-10-CM | POA: Diagnosis not present

## 2018-03-30 DIAGNOSIS — E43 Unspecified severe protein-calorie malnutrition: Secondary | ICD-10-CM | POA: Diagnosis present

## 2018-03-30 DIAGNOSIS — K265 Chronic or unspecified duodenal ulcer with perforation: Secondary | ICD-10-CM | POA: Diagnosis present

## 2018-03-30 DIAGNOSIS — K0889 Other specified disorders of teeth and supporting structures: Secondary | ICD-10-CM | POA: Diagnosis present

## 2018-03-30 DIAGNOSIS — J939 Pneumothorax, unspecified: Secondary | ICD-10-CM | POA: Diagnosis not present

## 2018-03-30 DIAGNOSIS — R000 Tachycardia, unspecified: Secondary | ICD-10-CM | POA: Diagnosis present

## 2018-03-30 DIAGNOSIS — J9809 Other diseases of bronchus, not elsewhere classified: Secondary | ICD-10-CM | POA: Diagnosis present

## 2018-03-30 DIAGNOSIS — J80 Acute respiratory distress syndrome: Secondary | ICD-10-CM

## 2018-03-30 DIAGNOSIS — E876 Hypokalemia: Secondary | ICD-10-CM | POA: Diagnosis not present

## 2018-03-30 DIAGNOSIS — Z791 Long term (current) use of non-steroidal anti-inflammatories (NSAID): Secondary | ICD-10-CM

## 2018-03-30 DIAGNOSIS — J9601 Acute respiratory failure with hypoxia: Secondary | ICD-10-CM | POA: Diagnosis present

## 2018-03-30 DIAGNOSIS — F17210 Nicotine dependence, cigarettes, uncomplicated: Secondary | ICD-10-CM | POA: Diagnosis present

## 2018-03-30 DIAGNOSIS — E871 Hypo-osmolality and hyponatremia: Secondary | ICD-10-CM | POA: Diagnosis not present

## 2018-03-30 DIAGNOSIS — J9600 Acute respiratory failure, unspecified whether with hypoxia or hypercapnia: Secondary | ICD-10-CM

## 2018-03-30 DIAGNOSIS — K2970 Gastritis, unspecified, without bleeding: Secondary | ICD-10-CM | POA: Diagnosis present

## 2018-03-30 DIAGNOSIS — J181 Lobar pneumonia, unspecified organism: Secondary | ICD-10-CM | POA: Diagnosis present

## 2018-03-30 DIAGNOSIS — J432 Centrilobular emphysema: Secondary | ICD-10-CM | POA: Diagnosis present

## 2018-03-30 DIAGNOSIS — J189 Pneumonia, unspecified organism: Secondary | ICD-10-CM

## 2018-03-30 DIAGNOSIS — J90 Pleural effusion, not elsewhere classified: Secondary | ICD-10-CM | POA: Diagnosis present

## 2018-03-30 DIAGNOSIS — R1084 Generalized abdominal pain: Secondary | ICD-10-CM | POA: Diagnosis present

## 2018-03-30 DIAGNOSIS — J449 Chronic obstructive pulmonary disease, unspecified: Secondary | ICD-10-CM | POA: Diagnosis not present

## 2018-03-30 DIAGNOSIS — Z6822 Body mass index (BMI) 22.0-22.9, adult: Secondary | ICD-10-CM

## 2018-03-30 DIAGNOSIS — J869 Pyothorax without fistula: Secondary | ICD-10-CM | POA: Diagnosis present

## 2018-03-30 DIAGNOSIS — Z800 Family history of malignant neoplasm of digestive organs: Secondary | ICD-10-CM

## 2018-03-30 DIAGNOSIS — J9811 Atelectasis: Secondary | ICD-10-CM | POA: Diagnosis present

## 2018-03-30 DIAGNOSIS — J811 Chronic pulmonary edema: Secondary | ICD-10-CM | POA: Diagnosis present

## 2018-03-30 DIAGNOSIS — Z886 Allergy status to analgesic agent status: Secondary | ICD-10-CM | POA: Diagnosis not present

## 2018-03-30 DIAGNOSIS — R188 Other ascites: Secondary | ICD-10-CM | POA: Diagnosis present

## 2018-03-30 DIAGNOSIS — K651 Peritoneal abscess: Secondary | ICD-10-CM | POA: Diagnosis present

## 2018-03-30 DIAGNOSIS — Z833 Family history of diabetes mellitus: Secondary | ICD-10-CM

## 2018-03-30 DIAGNOSIS — K220 Achalasia of cardia: Secondary | ICD-10-CM | POA: Diagnosis not present

## 2018-03-30 DIAGNOSIS — Z7289 Other problems related to lifestyle: Secondary | ICD-10-CM

## 2018-03-30 DIAGNOSIS — Z79899 Other long term (current) drug therapy: Secondary | ICD-10-CM

## 2018-03-30 DIAGNOSIS — Z888 Allergy status to other drugs, medicaments and biological substances status: Secondary | ICD-10-CM

## 2018-03-30 DIAGNOSIS — R079 Chest pain, unspecified: Secondary | ICD-10-CM | POA: Diagnosis not present

## 2018-03-30 DIAGNOSIS — R109 Unspecified abdominal pain: Secondary | ICD-10-CM

## 2018-03-30 DIAGNOSIS — J9690 Respiratory failure, unspecified, unspecified whether with hypoxia or hypercapnia: Secondary | ICD-10-CM

## 2018-03-30 DIAGNOSIS — E878 Other disorders of electrolyte and fluid balance, not elsewhere classified: Secondary | ICD-10-CM | POA: Diagnosis not present

## 2018-03-30 LAB — COMPREHENSIVE METABOLIC PANEL
ALK PHOS: 59 U/L (ref 38–126)
ALT: 17 U/L (ref 17–63)
AST: 24 U/L (ref 15–41)
Albumin: 4 g/dL (ref 3.5–5.0)
Anion gap: 11 (ref 5–15)
BILIRUBIN TOTAL: 1.7 mg/dL — AB (ref 0.3–1.2)
BUN: 10 mg/dL (ref 6–20)
CALCIUM: 10 mg/dL (ref 8.9–10.3)
CO2: 25 mmol/L (ref 22–32)
Chloride: 95 mmol/L — ABNORMAL LOW (ref 101–111)
Creatinine, Ser: 1.09 mg/dL (ref 0.61–1.24)
GFR calc Af Amer: >60 mL/min (ref >60)
GLUCOSE: 168 mg/dL — AB (ref 65–99)
POTASSIUM: 4.5 mmol/L (ref 3.5–5.1)
Sodium: 131 mmol/L — ABNORMAL LOW (ref 135–145)
TOTAL PROTEIN: 7.2 g/dL (ref 6.5–8.1)

## 2018-03-30 LAB — URINALYSIS, ROUTINE W REFLEX MICROSCOPIC
GLUCOSE, UA: NEGATIVE mg/dL
HGB URINE DIPSTICK: NEGATIVE
Ketones, ur: 5 mg/dL — AB
LEUKOCYTES UA: NEGATIVE
NITRITE: NEGATIVE
PH: 6 (ref 5.0–8.0)
Protein, ur: 30 mg/dL — AB
SPECIFIC GRAVITY, URINE: 1.018 (ref 1.005–1.030)

## 2018-03-30 LAB — LACTIC ACID, PLASMA
Lactic Acid, Venous: 2.3 mmol/L (ref 0.5–1.9)
Lactic Acid, Venous: 1.6 mmol/L (ref 0.5–1.9)

## 2018-03-30 LAB — CBC
HEMATOCRIT: 47.3 % (ref 39.0–52.0)
Hemoglobin: 16.2 g/dL (ref 13.0–17.0)
MCH: 29.3 pg (ref 26.0–34.0)
MCHC: 34.2 g/dL (ref 30.0–36.0)
MCV: 85.5 fL (ref 78.0–100.0)
Platelets: 442 10*3/uL — ABNORMAL HIGH (ref 150–400)
RBC: 5.53 MIL/uL (ref 4.22–5.81)
RDW: 13.4 % (ref 11.5–15.5)
WBC: 28.8 10*3/uL — AB (ref 4.0–10.5)

## 2018-03-30 LAB — LIPASE, BLOOD: Lipase: 35 U/L (ref 11–51)

## 2018-03-30 LAB — PROTIME-INR
INR: 1.07
Prothrombin Time: 13.9 seconds (ref 11.4–15.2)

## 2018-03-30 LAB — I-STAT CG4 LACTIC ACID, ED: LACTIC ACID, VENOUS: 1.9 mmol/L (ref 0.5–1.9)

## 2018-03-30 MED ORDER — METRONIDAZOLE IN NACL 5-0.79 MG/ML-% IV SOLN
500.0000 mg | Freq: Once | INTRAVENOUS | Status: AC
  Administered 2018-03-30: 500 mg via INTRAVENOUS
  Filled 2018-03-30: qty 100

## 2018-03-30 MED ORDER — SODIUM CHLORIDE 0.9 % IV BOLUS
1000.0000 mL | Freq: Once | INTRAVENOUS | Status: AC
  Administered 2018-03-30: 1000 mL via INTRAVENOUS

## 2018-03-30 MED ORDER — SODIUM CHLORIDE 0.9 % IV BOLUS
500.0000 mL | Freq: Once | INTRAVENOUS | Status: AC
  Administered 2018-03-30: 500 mL via INTRAVENOUS

## 2018-03-30 MED ORDER — GI COCKTAIL ~~LOC~~
30.0000 mL | Freq: Once | ORAL | Status: AC
  Administered 2018-03-30: 30 mL via ORAL
  Filled 2018-03-30: qty 30

## 2018-03-30 MED ORDER — FAMOTIDINE IN NACL 20-0.9 MG/50ML-% IV SOLN
20.0000 mg | Freq: Once | INTRAVENOUS | Status: AC
  Administered 2018-03-30: 20 mg via INTRAVENOUS
  Filled 2018-03-30: qty 50

## 2018-03-30 MED ORDER — ONDANSETRON HCL 4 MG/2ML IJ SOLN
4.0000 mg | Freq: Once | INTRAMUSCULAR | Status: AC
  Administered 2018-03-30: 4 mg via INTRAVENOUS
  Filled 2018-03-30: qty 2

## 2018-03-30 MED ORDER — FENTANYL CITRATE (PF) 100 MCG/2ML IJ SOLN
50.0000 ug | Freq: Once | INTRAMUSCULAR | Status: AC
  Administered 2018-03-30: 50 ug via INTRAVENOUS
  Filled 2018-03-30: qty 2

## 2018-03-30 MED ORDER — ONDANSETRON 4 MG PO TBDP
8.0000 mg | ORAL_TABLET | Freq: Once | ORAL | Status: AC
  Administered 2018-03-30: 8 mg via ORAL
  Filled 2018-03-30: qty 2

## 2018-03-30 MED ORDER — SODIUM CHLORIDE 0.9 % IV SOLN
2.0000 g | INTRAVENOUS | Status: DC
  Administered 2018-03-30: 2 g via INTRAVENOUS
  Filled 2018-03-30: qty 20

## 2018-03-30 MED ORDER — SODIUM CHLORIDE 0.9 % IV SOLN
500.0000 mg | INTRAVENOUS | Status: DC
  Administered 2018-03-30: 500 mg via INTRAVENOUS
  Filled 2018-03-30: qty 500

## 2018-03-30 MED ORDER — MORPHINE SULFATE (PF) 4 MG/ML IV SOLN
4.0000 mg | INTRAVENOUS | Status: DC | PRN
Start: 2018-03-30 — End: 2018-03-31
  Administered 2018-03-30: 4 mg via INTRAVENOUS
  Filled 2018-03-30: qty 1

## 2018-03-30 MED ORDER — IOHEXOL 300 MG/ML  SOLN
100.0000 mL | Freq: Once | INTRAMUSCULAR | Status: AC | PRN
  Administered 2018-03-30: 100 mL via INTRAVENOUS

## 2018-03-30 NOTE — ED Provider Notes (Signed)
[facility] EMERGENCY DEPARTMENT Provider Note   CSN: [id number] Arrival date & time: 03/30/18  1300  History   Chief Complaint Chief Complaint  Patient presents with  . Abdominal Pain    HPI Raymond Jacobs is a 67 y.o. male.  H/o daily EtOH use, tobacco abuse, daily ibuprofen use with abd pain x 3 days. Also with dyspnea, nonproductive cough x 1 day.   The history is provided by the patient.  Abdominal Pain   This is a recurrent (similar pain 3 years ago) problem. The problem occurs constantly. The problem has been gradually worsening. The pain is associated with an unknown factor. The pain is located in the RUQ, epigastric region and LUQ. The quality of the pain is sharp. The pain is severe. Associated symptoms include anorexia and nausea. Pertinent negatives include fever, diarrhea, vomiting, dysuria, hematuria and arthralgias. Nothing aggravates the symptoms. Nothing relieves the symptoms. Past workup includes GI consult.    Past Medical History:  Diagnosis Date  . Stomach ulcer     Patient Active Problem List   Diagnosis Date Noted  . Abdominal pain 03/30/2018  . 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 PUD (peptic ulcer disease) 08/15/2012    History reviewed. No pertinent surgical history.      Home Medications    Prior to Admission medications   Medication Sig Start Date End Date Taking? Authorizing Provider  acetaminophen (TYLENOL) 500 MG tablet Take 500 mg by mouth every 6 (six) hours as needed (for pain).    Yes [provider]  B Complex-C-Folic Acid (SUPER B-COMPLEX/VIT C/FA) TABS Take 1 tablet by mouth 2 (two) times a week.   Yes [provider]  Cyanocobalamin (VITAMIN B 12 PO) Take 1 tablet by mouth 2 (two) times a week.    Yes [provider]  ECHINACEA PO Take 2 capsules by mouth 2 (two) times a week.   Yes [provider]  FAMOTIDINE PO Take 1 tablet by mouth See admin instructions. Take 1 tablet by mouth 5 times a  week   Yes [provider]  ibuprofen (ADVIL,MOTRIN) 200 MG tablet Take 400 mg by mouth See admin instructions. Take 400 mg by mouth two to three times a day as needed for pain   Yes [provider]  Omega-3 Fatty Acids (FISH OIL PO) Take 1 capsule by mouth 2 (two) times a week.    Yes [provider]  VITAMIN E PO Take 1 capsule by mouth 2 (two) times a week.   Yes [provider]  clobetasol ointment (TEMOVATE) 0.05 % Apply topically 2 (two) times daily. Patient not taking: Reported on 03/30/2018 05/16/13   Janne Napoleon, NP  esomeprazole (NEXIUM) 40 MG packet Take 40 mg by mouth 2 (two) times daily. Patient not taking: Reported on 03/30/2018 08/21/12   Jerene Bears, MD  famotidine (PEPCID) 20 MG tablet Take 1 tablet (20 mg total) by mouth 2 (two) times daily. Patient not taking: Reported on 03/30/2018 08/15/12   Jerene Bears, MD  lanolin ointment Apply topically as needed for dry skin. Patient not taking: Reported on 03/30/2018 05/16/13   Janne Napoleon, NP    Family History Family History  Problem Relation Age of Onset  . Colon cancer Brother   . Diabetes Brother   . Diabetes Maternal Aunt     Social History Social History   Tobacco Use  . Smoking status: Current Every Day Smoker    Packs/day: 1.00    Types:  Cigarettes  . Smokeless tobacco: Never Used  . Tobacco comment: form given 08-15-12  Substance Use Topics  . Alcohol use: Yes    Comment: 1 beer and 3 shots vodka daily  . Drug use: Yes    Types: Marijuana     Allergies   Asa [aspirin] and Other   Review of Systems Review of Systems  Constitutional: Positive for appetite change and chills. Negative for fever.  HENT: Negative for ear pain and sore throat.   Eyes: Negative for pain and visual disturbance.  Respiratory: Positive for cough and shortness of breath.   Cardiovascular: Negative for chest pain and palpitations.  Gastrointestinal: Positive for abdominal pain, anorexia and nausea.  Negative for diarrhea and vomiting.  Genitourinary: Negative for dysuria and hematuria.  Musculoskeletal: Negative for arthralgias and back pain.  Skin: Negative for color change and rash.  Neurological: Negative for seizures and syncope.  All other systems reviewed and are negative.    Physical Exam Updated Vital Signs BP 120/79   Pulse 94   Temp 98 F (36.7 C) (Oral)   Resp (!) 26   SpO2 92%   Physical Exam  Constitutional: He is oriented to person, place, and time. He appears well-developed.  Thin, appears uncomfortable  HENT:  Head: Normocephalic and atraumatic.  Mouth/Throat: Mucous membranes are dry.  Eyes: Conjunctivae and EOM are normal.  Neck: Neck supple.  Cardiovascular: Regular rhythm and intact distal pulses. Tachycardia present.  No murmur heard. Pulmonary/Chest: Effort normal. No stridor. Tachypnea noted. No respiratory distress. He has decreased breath sounds (R base).  Abdominal: Soft. He exhibits no distension. There is hepatomegaly. There is tenderness in the right upper quadrant, epigastric area, periumbilical area and left upper quadrant. There is no rebound and no guarding.  Musculoskeletal: He exhibits no edema.  Neurological: He is alert and oriented to person, place, and time.  Skin: Skin is warm and dry.  Psychiatric: He has a normal mood and affect.  Nursing note and vitals reviewed.    ED Treatments / Results  Labs (all labs ordered are listed, but only abnormal results are displayed) Labs Reviewed  COMPREHENSIVE METABOLIC PANEL - Abnormal; Notable for the following components:      Result Value   Sodium 131 (*)    Chloride 95 (*)    Glucose, Bld 168 (*)    Total Bilirubin 1.7 (*)    All other components within normal limits  CBC - Abnormal; Notable for the following components:   WBC 28.8 (*)    Platelets 442 (*)    All other components within normal limits  URINALYSIS, ROUTINE W REFLEX MICROSCOPIC - Abnormal; Notable for the following  components:   Color, Urine AMBER (*)    Bilirubin Urine SMALL (*)    Ketones, ur 5 (*)    Protein, ur 30 (*)    Bacteria, UA RARE (*)    All other components within normal limits  LACTIC ACID, PLASMA - Abnormal; Notable for the following components:   Lactic Acid, Venous 2.3 (*)    All other components within normal limits  CULTURE, BLOOD (ROUTINE X 2)  CULTURE, BLOOD (ROUTINE X 2)  URINE CULTURE  LIPASE, BLOOD  LACTIC ACID, PLASMA  PROTIME-INR  LACTIC ACID, PLASMA  LACTIC ACID, PLASMA  I-STAT CG4 LACTIC ACID, ED  I-STAT CG4 LACTIC ACID, ED    EKG None  EKG At 13:58 with sinus tachycardia, RAD, nl intervals, no STE/D  Radiology Dg Chest 1 View  Result Date:  03/30/2018 CLINICAL DATA:  Cough. EXAM: CHEST  1 VIEW COMPARISON:  Abdominal series with frontal chest x-ray from same day. Chest x-ray dated July 13, 2012. FINDINGS: Single lateral view demonstrates bibasilar atelectasis/scarring with patchy airspace disease in the right lower lobe. No pleural effusion. IMPRESSION: Bibasilar atelectasis/scarring with superimposed right lower lobe pneumonia. Electronically Signed   By: Titus Dubin M.D.   On: 03/30/2018 17:18   Ct Abdomen Pelvis W Contrast  Addendum Date: 03/30/2018   ADDENDUM REPORT: 03/30/2018 22:03 ADDENDUM: Critical Value/emergent results were called by telephone at the time of interpretation on 03/30/2018 at 10:00 pm to Dr. Nanda Quinton , who verbally acknowledged these results. Electronically Signed   By: Elon Alas M.D.   On: 03/30/2018 22:03   Result Date: 03/30/2018 CLINICAL DATA:  Generalized abdominal pain for 2 days. History of stomach ulcer. EXAM: CT ABDOMEN AND PELVIS WITH CONTRAST TECHNIQUE: Multidetector CT imaging of the abdomen and pelvis was performed using the standard protocol following bolus administration of intravenous contrast. CONTRAST:  144mL OMNIPAQUE IOHEXOL 300 MG/ML  SOLN COMPARISON:  None. FINDINGS: LOWER CHEST: Partially imaged 8.1 x 5.1  cm rim enhancing fluid collection RIGHT lung base with adjacent consolidation with air bronchograms. Mild RIGHT middle lobe and LEFT lower lobe atelectasis versus pneumonia. HEPATOBILIARY: Liver and gallbladder are normal. PANCREAS: Normal. SPLEEN: Normal. ADRENALS/URINARY TRACT: Kidneys are orthotopic, demonstrating symmetric enhancement. No nephrolithiasis, hydronephrosis or solid renal masses. The unopacified ureters are normal in course and caliber. Delayed imaging through the kidneys demonstrates symmetric prompt contrast excretion within the proximal urinary collecting system. Urinary bladder is partially distended with layering densities on delayed phase. Normal adrenal glands. STOMACH/BOWEL: Apparent dehiscent proximal duodenum concerning for ulcer tracking to through the serosal surface with potential punctate pneumoperitoneum (coronal image 26 and 27. Mild hyperemic small-bowel wall thickening. Mild colonic wall thickening, bowel is overall normal in course and caliber. Mildly edematous appendix similar to degree of breast:. VASCULAR/LYMPHATIC: Aortoiliac vessels are normal in course and caliber. Moderate calcific atherosclerosis. No lymphadenopathy by CT size criteria. REPRODUCTIVE: Normal. OTHER: Moderate amount of low-density free fluid without rim enhancing fluid collections. Mild mesenteric edema. MUSCULOSKELETAL: Nonacute.  Severe L4-5 and L5-S1 degenerative disc. IMPRESSION: 1. 8.1 x 5.1 cm RIGHT lung base fluid collection, this is likely pleural-based seen with empyema, atypical appearing hiatal hernia or patulous esophagus/achalasia. RIGHT potentially LEFT lung base pneumonia. Recommend CT chest with contrast and concurrent CT esophagram. 2. Probable perforated duodenal ulcer the punctate pneumoperitoneum. Moderate volume ascites. 3. Enterocolitis, presumably reactive. 4. Layering density in the urinary bladder concerning for calcification, less likely contrast. Electronically Signed: By: Elon Alas M.D. On: 03/30/2018 22:00   Dg Abd Acute W/chest  Result Date: 03/30/2018 CLINICAL DATA:  Shortness of breath and abdominal pain EXAM: DG ABDOMEN ACUTE W/ 1V CHEST COMPARISON:  Chest radiograph and abdominal radiograph July 13, 2012 FINDINGS: PA chest: There is atelectatic change in lung bases with patchy consolidation in the right base. The lungs elsewhere are clear. Heart size and pulmonary vascularity are normal. No adenopathy. There is aortic atherosclerosis. Supine and upright abdomen: There is moderate stool throughout the colon. There is no bowel dilatation or air-fluid level to suggest bowel obstruction. No free air. Stomach appears filled with fluid. There is degenerative change in the lumbar spine. IMPRESSION: Bibasilar atelectasis with probable early pneumonia right base. No bowel obstruction or free air.  Fluid-filled stomach evident. Electronically Signed   By: Lowella Grip III M.D.   On: 03/30/2018 13:56  Procedures Procedures (including critical care time)  Medications Ordered in ED Medications  morphine 4 MG/ML injection 4 mg (4 mg Intravenous Given 03/30/18 1820)  gi cocktail (Maalox,Lidocaine,Donnatal) (30 mLs Oral Given 03/30/18 1357)  ondansetron (ZOFRAN-ODT) disintegrating tablet 8 mg (8 mg Oral Given 03/30/18 1357)  sodium chloride 0.9 % bolus 1,000 mL (0 mLs Intravenous Stopped 03/30/18 1755)  ondansetron (ZOFRAN) injection 4 mg (4 mg Intravenous Given 03/30/18 1737)  famotidine (PEPCID) IVPB 20 mg premix (0 mg Intravenous Stopped 03/30/18 1806)  fentaNYL (SUBLIMAZE) injection 50 mcg (50 mcg Intravenous Given 03/30/18 1737)  metroNIDAZOLE (FLAGYL) IVPB 500 mg (0 mg Intravenous Stopped 03/30/18 1902)  sodium chloride 0.9 % bolus 500 mL (0 mLs Intravenous Stopped 03/30/18 2256)  iohexol (OMNIPAQUE) 300 MG/ML solution 100 mL (100 mLs Intravenous Contrast Given 03/30/18 2123)  sodium chloride 0.9 % bolus 1,000 mL (1,000 mLs Intravenous New Bag/Given 03/30/18 2345)      Initial Impression / Assessment and Plan / ED Course  I have reviewed the triage vital signs and the nursing notes.  Pertinent labs & imaging results that were available during my care of the patient were reviewed by me and considered in my medical decision making (see chart for details).     Raymond Jacobs is a 67 y.o. male with PMHx of daily EtOH use, alcoholic gastritis, tobacco abuse, daily NSAID use who p/w abd pain x 3 days, also with cough and dyspnea x 1 day. Reviewed and confirmed nursing documentation for past medical history, family history, social history. VS afebrile, HR 107, BP wnl. Exam remarkable for tachypnea with decreased breath sounds to R base, RUQ/epigoastric tenderness. Ddx includes pancreatitis, gastritis, cholelithiasis/cholecystitis. Less likely ACS. Also concern for possible PNA.   Minimal improvement with GI cocktail and PO zofran, improvement after IV pepcid, narcotics. CMP with hyponatremia 131, hyperbilirubinemia 1.7, nl AG. CBC with leukocytosis 28.8, thrombocytosis 442. UA with ketones, otherwise wnl. Lipase wnl. Lactic acid 2.3 ->1.6 after 1.5L IVF.INR wnl.   CXR with RLL PNA. IV ceftriazone, metronidazole given to cover for potential aspiration source given h/o EtOH use. CT abd/pelvis with R lung base fluid collection likely pleural-based possibily secondary to empyema. Probable perforated duodenal ulcer with punctate pneumoperitoneum. Blood cultures pending. Consulted surgery, who evaluated patient and will admit.   Old records reviewed. Labs reviewed by me and used in the medical decision making.  Imaging viewed and interpreted by me and used in the medical decision making (formal interpretation from radiologist). EKG reviewed by me and used in the medical decision making.   Final Clinical Impressions(s) / ED Diagnoses   Final diagnoses:  Generalized abdominal pain  Abdominal pain  Pneumonia of right lower lobe due to infectious organism [facility])   Perforated duodenal ulcer [facility])    ED Discharge Orders    None       Norm Salt, MD 03/31/18 5427    Margette Fast, MD 03/31/18 [phone]

## 2018-03-30 NOTE — ED Notes (Signed)
Patient transported to CT 

## 2018-03-30 NOTE — ED Triage Notes (Signed)
Pt in via EMS to triage, c/o abdominal pain that started a few days ago, also mild n/v, history of ulcers and is taking ibuprofen 4-6 tablets per day

## 2018-03-30 NOTE — ED Notes (Signed)
Patient transported to X-ray 
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
## 2018-03-30 NOTE — ED Provider Notes (Addendum)
Patient placed in Quick Look pathway, seen and evaluated   Chief Complaint: Abdominal pain  HPI:   67 year old male presents with severe generalized abdominal pain for the past couple days. He reports a similar episode several years ago when he came to the ED and was diagnosed with an "inflammed stomach". Pain is constant, severe, generalized. No prior abdominal surgeries. He has had a colonoscopy. He tried Famotidine, Ibuprofen, Tylenol without relief. He reports taking Ibuprofen regularly. He's had N/V. No hematemesis, hematochezia/melena.  He does drink quite a bit of alcohol.    ROS: +abdominal pain  Physical Exam:   Gen: Mild distres  Neuro: Awake and Alert  Skin: Warm    Focused Exam: Heart: Mild tachycardia    Lungs: Mildly tachypneic    Abdomen: Voluntary guarding with generalized tenderness   Initiation of care has begun. The patient has been counseled on the process, plan, and necessity for staying for the completion/evaluation, and the remainder of the medical screening examination      Recardo Evangelist, PA-C 03/30/18 [place], Kevin, MD 03/30/18 [phone]

## 2018-03-31 ENCOUNTER — Inpatient Hospital Stay (HOSPITAL_COMMUNITY): Payer: Medicare Other

## 2018-03-31 ENCOUNTER — Encounter (HOSPITAL_COMMUNITY): Payer: Self-pay | Admitting: Radiology

## 2018-03-31 DIAGNOSIS — J939 Pneumothorax, unspecified: Secondary | ICD-10-CM

## 2018-03-31 DIAGNOSIS — E43 Unspecified severe protein-calorie malnutrition: Secondary | ICD-10-CM

## 2018-03-31 LAB — COMPREHENSIVE METABOLIC PANEL
ALBUMIN: 2.9 g/dL — AB (ref 3.5–5.0)
ALT: 12 U/L — AB (ref 17–63)
AST: 18 U/L (ref 15–41)
Alkaline Phosphatase: 37 U/L — ABNORMAL LOW (ref 38–126)
Anion gap: 9 (ref 5–15)
BILIRUBIN TOTAL: 1.1 mg/dL (ref 0.3–1.2)
BUN: 14 mg/dL (ref 6–20)
CO2: 19 mmol/L — ABNORMAL LOW (ref 22–32)
Calcium: 8.3 mg/dL — ABNORMAL LOW (ref 8.9–10.3)
Chloride: 104 mmol/L (ref 101–111)
Creatinine, Ser: 0.89 mg/dL (ref 0.61–1.24)
GFR calc Af Amer: >60 mL/min (ref >60)
GLUCOSE: 117 mg/dL — AB (ref 65–99)
POTASSIUM: 4.4 mmol/L (ref 3.5–5.1)
Sodium: 132 mmol/L — ABNORMAL LOW (ref 135–145)
TOTAL PROTEIN: 5.3 g/dL — AB (ref 6.5–8.1)

## 2018-03-31 LAB — CBC
HEMATOCRIT: 41.9 % (ref 39.0–52.0)
Hemoglobin: 14.2 g/dL (ref 13.0–17.0)
MCH: 28.5 pg (ref 26.0–34.0)
MCHC: 33.9 g/dL (ref 30.0–36.0)
MCV: 84 fL (ref 78.0–100.0)
PLATELETS: 385 10*3/uL (ref 150–400)
RBC: 4.99 MIL/uL (ref 4.22–5.81)
RDW: 13.4 % (ref 11.5–15.5)
WBC: 17.3 10*3/uL — ABNORMAL HIGH (ref 4.0–10.5)

## 2018-03-31 LAB — LACTIC ACID, PLASMA: Lactic Acid, Venous: 2 mmol/L (ref 0.5–1.9)

## 2018-03-31 LAB — PROTIME-INR
INR: 1.39
PROTHROMBIN TIME: 17 s — AB (ref 11.4–15.2)

## 2018-03-31 LAB — APTT: aPTT: 32 seconds (ref 24–36)

## 2018-03-31 LAB — I-STAT CG4 LACTIC ACID, ED: Lactic Acid, Venous: 2.08 mmol/L (ref 0.5–1.9)

## 2018-03-31 LAB — MRSA PCR SCREENING: MRSA by PCR: NEGATIVE

## 2018-03-31 LAB — HIV ANTIBODY (ROUTINE TESTING W REFLEX): HIV Screen 4th Generation wRfx: NONREACTIVE

## 2018-03-31 LAB — PROCALCITONIN: Procalcitonin: 10.32 ng/mL

## 2018-03-31 MED ORDER — PANTOPRAZOLE SODIUM 40 MG IV SOLR
40.0000 mg | Freq: Two times a day (BID) | INTRAVENOUS | Status: DC
  Administered 2018-04-03 – 2018-04-09 (×14): 40 mg via INTRAVENOUS
  Filled 2018-03-31 (×17): qty 40

## 2018-03-31 MED ORDER — HYDROMORPHONE HCL 1 MG/ML IJ SOLN
1.0000 mg | INTRAMUSCULAR | Status: DC | PRN
  Administered 2018-04-01 – 2018-04-02 (×7): 1 mg via INTRAVENOUS
  Filled 2018-03-31 (×8): qty 1

## 2018-03-31 MED ORDER — SODIUM CHLORIDE 0.9 % IV SOLN
80.0000 mg | Freq: Once | INTRAVENOUS | Status: AC
  Administered 2018-03-31: 80 mg via INTRAVENOUS
  Filled 2018-03-31: qty 80

## 2018-03-31 MED ORDER — IOHEXOL 300 MG/ML  SOLN
100.0000 mL | Freq: Once | INTRAMUSCULAR | Status: AC | PRN
  Administered 2018-03-31: 100 mL via INTRAVENOUS

## 2018-03-31 MED ORDER — ENOXAPARIN SODIUM 40 MG/0.4ML ~~LOC~~ SOLN
40.0000 mg | Freq: Every day | SUBCUTANEOUS | Status: AC
  Administered 2018-03-31 – 2018-04-08 (×9): 40 mg via SUBCUTANEOUS
  Filled 2018-03-31 (×10): qty 0.4

## 2018-03-31 MED ORDER — ONDANSETRON HCL 4 MG/2ML IJ SOLN: 4.0000 mg | Freq: Four times a day (QID) | INTRAMUSCULAR | Status: DC | PRN

## 2018-03-31 MED ORDER — MORPHINE SULFATE (PF) 4 MG/ML IV SOLN
1.0000 mg | INTRAVENOUS | Status: DC | PRN
  Filled 2018-03-31: qty 1

## 2018-03-31 MED ORDER — ONDANSETRON 4 MG PO TBDP
4.0000 mg | ORAL_TABLET | Freq: Four times a day (QID) | ORAL | Status: DC | PRN
  Filled 2018-03-31: qty 1

## 2018-03-31 MED ORDER — HYDROMORPHONE HCL 2 MG/ML IJ SOLN
1.0000 mg | INTRAMUSCULAR | Status: DC | PRN
  Administered 2018-03-31: 1 mg via INTRAVENOUS
  Filled 2018-03-31: qty 1

## 2018-03-31 MED ORDER — POTASSIUM CHLORIDE IN NACL 20-0.9 MEQ/L-% IV SOLN
INTRAVENOUS | Status: DC
  Administered 2018-03-31 – 2018-04-03 (×7): via INTRAVENOUS
  Filled 2018-03-31 (×9): qty 1000

## 2018-03-31 MED ORDER — SODIUM CHLORIDE 0.9 % IV SOLN
8.0000 mg/h | INTRAVENOUS | Status: AC
  Administered 2018-03-31 – 2018-04-02 (×7): 8 mg/h via INTRAVENOUS
  Filled 2018-03-31 (×10): qty 80

## 2018-03-31 MED ORDER — PIPERACILLIN-TAZOBACTAM 3.375 G IVPB
3.3750 g | Freq: Three times a day (TID) | INTRAVENOUS | Status: AC
  Administered 2018-03-31 – 2018-04-04 (×15): 3.375 g via INTRAVENOUS
  Filled 2018-03-31 (×17): qty 50

## 2018-03-31 NOTE — Progress Notes (Addendum)
Initial Nutrition Assessment  DOCUMENTATION CODES:   Severe malnutrition in context of chronic illness  INTERVENTION:   RD will monitor for diet advancement vs the need for nutrition support  Recommend thiamine 110m IV daily  Recommend folic acid 149mIV daily   NUTRITION DIAGNOSIS:   Severe Malnutrition related to chronic illness(etoh abuse, GI ulcers) as evidenced by severe fat depletion, severe muscle depletion.  GOAL:   Patient will meet greater than or equal to 90% of their needs  MONITOR:   Diet advancement, Labs, Weight trends, Skin, I & O's  REASON FOR ASSESSMENT:   Malnutrition Screening Tool    ASSESSMENT:   [age]/o male with h/o etoh abuse, peptic ulcers, chronic NSAID use who was admitted on 6/7 for abdominal pain. CT scan of the abdomen showed a small amount of ascites and was suggestive of a duodenal ulcer but showed no free air.  He was suspected of having a sealed duodenal perforation and was admitted for antibiotics and observation.  The same CT scan suggested a fluid collection in the chest. Subsequently a CT of the chest was obtained which showed a hugely dilated esophagus and also suggested obstruction of the bronchus intermedius with collapse of the right middle and right lower lobes.   Met with pt in room today. Pt reports intermittent poor appetite and oral intake at baseline r/t etoh use. Pt reports having top and bottom dentures that sometimes move around and make it difficult for him to chew. Pt also reports difficulty swallowing meats and food getting stuck in his chest. Pt tries to avoid meats because of this. Recommend dysphagia 3 diet with diet advancement. Pt reports he does drink equate brand supplements at home, chocolate flavors. Pt also reports taking several different vitamins at home including B12, B6, vitamin E and centrum MVI. Pt reports his UBW ~119-120lbs. Per chart, pt with wt gain. Pt would like to have chocolate Ensure and Magic Cups when  diet advanced. Recommend daily MVI also. 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 Pt currently NPO. RD will monitor for diet advancement vs the need for nutrition support.    Medications reviewed and include: lovenox, protonix, NaCl w/ KCl @125ml /hr, zosyn, hydromorphone   Labs reviewed: Na 132(L), K 4.4 wnl, Ca 8.3(L) adj. 9.18 wnl, alb 2.9(L) Wbc- 17.3(H)  NUTRITION - FOCUSED PHYSICAL EXAM:    Most Recent Value  Orbital Region  Moderate depletion  Upper Arm Region  Severe depletion  Thoracic and Lumbar Region  Severe depletion  Buccal Region  Moderate depletion  Temple Region  Moderate depletion  Clavicle Bone Region  Severe depletion  Clavicle and Acromion Bone Region  Severe depletion  Scapular Bone Region  Severe depletion  Dorsal Hand  Severe depletion  Patellar Region  Severe depletion  Anterior Thigh Region  Severe depletion  Posterior Calf Region  Severe depletion  Edema (RD Assessment)  None  Hair  Reviewed  Eyes  Reviewed  Mouth  Reviewed  Skin  Reviewed  Nails  Reviewed     Diet Order:   Diet Order           Diet NPO time specified  Diet effective now         EDUCATION NEEDS:   Education needs have been addressed  Skin:  Skin Assessment: Reviewed RN Assessment  Last BM:  pta  Height:   Ht Readings from Last 1 Encounters:  03/31/18 5' 4"  (1.626 m)  Weight:   Wt Readings from Last 1 Encounters:  03/31/18 130 lb 8.2 oz (59.2 kg)    Ideal Body Weight:  59 kg  BMI:  Body mass index is 22.4 kg/m.  Estimated Nutritional Needs:   Kcal:  1600-1900kcal/day   Protein:  88-100g/day   Fluid:  >1.5L/day   Koleen Distance MS, RD, LDN Pager #- [phone] Office#- [phone] After Hours Pager: [phone]

## 2018-03-31 NOTE — Progress Notes (Signed)
Subjective/Chief Complaint: Still with abdominal pain, denies chest pain.    Objective: Vital signs in last 24 hours: Temp:  [98 F (36.7 C)-99 F (37.2 C)] 98.4 F (36.9 C) (06/08 0752) Pulse Rate:  [87-113] 98 (06/08 0752) Resp:  [15-57] 25 (06/08 0752) BP: (99-149)/(65-88) 110/72 (06/08 0752) SpO2:  [87 %-100 %] 91 % (06/08 0752) Weight:  [59.2 kg (130 lb 8.2 oz)] 59.2 kg (130 lb 8.2 oz) (06/08 0600)    Intake/Output from previous day: 06/07 0701 - 06/08 0700 In: 3572.9 [I.V.:422.9; IV Piggyback:3150] Out: -  Intake/Output this shift: Total I/O In: -  Out: 200 [Urine:200]  General appearance: alert and cooperative Resp: clear to auscultation bilaterally GI: soft, mildly distended, tender in epigastrium with voluntary guarding Neurologic: Grossly normal  Lab Results:  Recent Labs    03/30/18 1308 03/31/18 0331  WBC 28.8* 17.3*  HGB 16.2 14.2  HCT 47.3 41.9  PLT 442* 385   BMET Recent Labs    03/30/18 1308 03/31/18 0331  NA 131* 132*  K 4.5 4.4  CL 95* 104  CO2 25 19*  GLUCOSE 168* 117*  BUN 10 14  CREATININE 1.09 0.89  CALCIUM 10.0 8.3*   PT/INR Recent Labs    03/30/18 1720 03/31/18 0331  LABPROT 13.9 17.0*  INR 1.07 1.39   ABG No results for input(s): PHART, HCO3 in the last 72 hours.  Invalid input(s): PCO2, PO2  Studies/Results: Dg Chest 1 View  Result Date: 03/30/2018 CLINICAL DATA:  Cough. EXAM: CHEST  1 VIEW COMPARISON:  Abdominal series with frontal chest x-ray from same day. Chest x-ray dated July 13, 2012. FINDINGS: Single lateral view demonstrates bibasilar atelectasis/scarring with patchy airspace disease in the right lower lobe. No pleural effusion. IMPRESSION: Bibasilar atelectasis/scarring with superimposed right lower lobe pneumonia. Electronically Signed   By: Titus Dubin M.D.   On: 03/30/2018 17:18   Ct Abdomen Pelvis W Contrast  Addendum Date: 03/30/2018   ADDENDUM REPORT: 03/30/2018 22:03 ADDENDUM: Critical  Value/emergent results were called by telephone at the time of interpretation on 03/30/2018 at 10:00 pm to Dr. Nanda Quinton , who verbally acknowledged these results. Electronically Signed   By: Elon Alas M.D.   On: 03/30/2018 22:03   Result Date: 03/30/2018 CLINICAL DATA:  Generalized abdominal pain for 2 days. History of stomach ulcer. EXAM: CT ABDOMEN AND PELVIS WITH CONTRAST TECHNIQUE: Multidetector CT imaging of the abdomen and pelvis was performed using the standard protocol following bolus administration of intravenous contrast. CONTRAST:  121mL OMNIPAQUE IOHEXOL 300 MG/ML  SOLN COMPARISON:  None. FINDINGS: LOWER CHEST: Partially imaged 8.1 x 5.1 cm rim enhancing fluid collection RIGHT lung base with adjacent consolidation with air bronchograms. Mild RIGHT middle lobe and LEFT lower lobe atelectasis versus pneumonia. HEPATOBILIARY: Liver and gallbladder are normal. PANCREAS: Normal. SPLEEN: Normal. ADRENALS/URINARY TRACT: Kidneys are orthotopic, demonstrating symmetric enhancement. No nephrolithiasis, hydronephrosis or solid renal masses. The unopacified ureters are normal in course and caliber. Delayed imaging through the kidneys demonstrates symmetric prompt contrast excretion within the proximal urinary collecting system. Urinary bladder is partially distended with layering densities on delayed phase. Normal adrenal glands. STOMACH/BOWEL: Apparent dehiscent proximal duodenum concerning for ulcer tracking to through the serosal surface with potential punctate pneumoperitoneum (coronal image 26 and 27. Mild hyperemic small-bowel wall thickening. Mild colonic wall thickening, bowel is overall normal in course and caliber. Mildly edematous appendix similar to degree of breast:. VASCULAR/LYMPHATIC: Aortoiliac vessels are normal in course and caliber. Moderate calcific atherosclerosis. No lymphadenopathy  by CT size criteria. REPRODUCTIVE: Normal. OTHER: Moderate amount of low-density free fluid without rim  enhancing fluid collections. Mild mesenteric edema. MUSCULOSKELETAL: Nonacute.  Severe L4-5 and L5-S1 degenerative disc. IMPRESSION: 1. 8.1 x 5.1 cm RIGHT lung base fluid collection, this is likely pleural-based seen with empyema, atypical appearing hiatal hernia or patulous esophagus/achalasia. RIGHT potentially LEFT lung base pneumonia. Recommend CT chest with contrast and concurrent CT esophagram. 2. Probable perforated duodenal ulcer the punctate pneumoperitoneum. Moderate volume ascites. 3. Enterocolitis, presumably reactive. 4. Layering density in the urinary bladder concerning for calcification, less likely contrast. Electronically Signed: By: Elon Alas M.D. On: 03/30/2018 22:00   Dg Abd Acute W/chest  Result Date: 03/30/2018 CLINICAL DATA:  Shortness of breath and abdominal pain EXAM: DG ABDOMEN ACUTE W/ 1V CHEST COMPARISON:  Chest radiograph and abdominal radiograph July 13, 2012 FINDINGS: PA chest: There is atelectatic change in lung bases with patchy consolidation in the right base. The lungs elsewhere are clear. Heart size and pulmonary vascularity are normal. No adenopathy. There is aortic atherosclerosis. Supine and upright abdomen: There is moderate stool throughout the colon. There is no bowel dilatation or air-fluid level to suggest bowel obstruction. No free air. Stomach appears filled with fluid. There is degenerative change in the lumbar spine. IMPRESSION: Bibasilar atelectasis with probable early pneumonia right base. No bowel obstruction or free air.  Fluid-filled stomach evident. Electronically Signed   By: Lowella Grip III M.D.   On: 03/30/2018 13:56    Anti-infectives: Anti-infectives (From admission, onward)   Start     Dose/Rate Route Frequency Ordered Stop   03/31/18 0115  piperacillin-tazobactam (ZOSYN) IVPB 3.375 g     3.375 g 12.5 mL/hr over 240 Minutes Intravenous Every 8 hours 03/31/18 0106     03/30/18 1615  cefTRIAXone (ROCEPHIN) 2 g in sodium chloride  0.9 % 100 mL IVPB  Status:  Discontinued     2 g 200 mL/hr over 30 Minutes Intravenous Every 24 hours 03/30/18 1613 03/30/18 2321   03/30/18 1615  azithromycin (ZITHROMAX) 500 mg in sodium chloride 0.9 % 250 mL IVPB  Status:  Discontinued     500 mg 250 mL/hr over 60 Minutes Intravenous Every 24 hours 03/30/18 1613 03/30/18 2321   03/30/18 1615  metroNIDAZOLE (FLAGYL) IVPB 500 mg     500 mg 100 mL/hr over 60 Minutes Intravenous  Once 03/30/18 1613 03/30/18 1902      Assessment/Plan: s/p * No surgery found * Follow up Repeat Ct with oral contrast- read pending. 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  Will keep NPO IV antibiotics IV PPI   LOS: 1 day    Clovis Riley 03/31/2018
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
## 2018-04-01 ENCOUNTER — Inpatient Hospital Stay (HOSPITAL_COMMUNITY): Payer: Medicare Other

## 2018-04-01 LAB — COMPREHENSIVE METABOLIC PANEL
ALBUMIN: 2.3 g/dL — AB (ref 3.5–5.0)
ALT: 13 U/L — AB (ref 17–63)
ANION GAP: 8 (ref 5–15)
AST: 18 U/L (ref 15–41)
Alkaline Phosphatase: 45 U/L (ref 38–126)
BILIRUBIN TOTAL: 0.9 mg/dL (ref 0.3–1.2)
BUN: 14 mg/dL (ref 6–20)
CO2: 20 mmol/L — AB (ref 22–32)
Calcium: 7.9 mg/dL — ABNORMAL LOW (ref 8.9–10.3)
Chloride: 107 mmol/L (ref 101–111)
Creatinine, Ser: 0.91 mg/dL (ref 0.61–1.24)
GFR calc Af Amer: >60 mL/min (ref >60)
GFR calc non Af Amer: >60 mL/min (ref >60)
GLUCOSE: 89 mg/dL (ref 65–99)
Potassium: 4 mmol/L (ref 3.5–5.1)
SODIUM: 135 mmol/L (ref 135–145)
Total Protein: 5.5 g/dL — ABNORMAL LOW (ref 6.5–8.1)

## 2018-04-01 LAB — MAGNESIUM: Magnesium: 1.9 mg/dL (ref 1.7–2.4)

## 2018-04-01 LAB — URINE CULTURE: Culture: <10000 — AB

## 2018-04-01 LAB — CBC
HEMATOCRIT: 39.7 % (ref 39.0–52.0)
HEMOGLOBIN: 13.5 g/dL (ref 13.0–17.0)
MCH: 29.2 pg (ref 26.0–34.0)
MCHC: 34 g/dL (ref 30.0–36.0)
MCV: 85.9 fL (ref 78.0–100.0)
Platelets: 330 10*3/uL (ref 150–400)
RBC: 4.62 MIL/uL (ref 4.22–5.81)
RDW: 14.4 % (ref 11.5–15.5)
WBC: 21.4 10*3/uL — ABNORMAL HIGH (ref 4.0–10.5)

## 2018-04-01 LAB — PROCALCITONIN: Procalcitonin: 11.64 ng/mL

## 2018-04-01 MED ORDER — IOPAMIDOL (ISOVUE-300) INJECTION 61%
INTRAVENOUS | Status: AC
  Filled 2018-04-01: qty 50

## 2018-04-01 MED ORDER — LIDOCAINE VISCOUS HCL 2 % MT SOLN
OROMUCOSAL | Status: AC
  Filled 2018-04-01: qty 15

## 2018-04-01 NOTE — Progress Notes (Signed)
Subjective/Chief Complaint: Short of breath, stomach still hurts, right now more comfortable   Objective: Vital signs in last 24 hours: Temp:  [97.8 F (36.6 C)-100 F (37.8 C)] 97.8 F (36.6 C) (06/09 0913) Pulse Rate:  [96-104] 101 (06/09 0400) Resp:  [20-24] 20 (06/09 0400) BP: (112-129)/(70-78) 129/70 (06/09 0400) SpO2:  [91 %-95 %] 91 % (06/09 0400) Last BM Date: 03/30/18  Intake/Output from previous day: 06/08 0701 - 06/09 0700 In: 3025 [I.V.:2875; IV Piggyback:150] Out: 975 [Urine:975] Intake/Output this shift: No intake/output data recorded.   General appearance: alert and cooperative Resp: decreased breath sounds right base GI: soft, mildly distended, tender in epigastrium with voluntary guarding   Lab Results:  Recent Labs    03/31/18 0331 04/01/18 0332  WBC 17.3* 21.4*  HGB 14.2 13.5  HCT 41.9 39.7  PLT 385 330   BMET Recent Labs    03/31/18 0331 04/01/18 0332  NA 132* 135  K 4.4 4.0  CL 104 107  CO2 19* 20*  GLUCOSE 117* 89  BUN 14 14  CREATININE 0.89 0.91  CALCIUM 8.3* 7.9*   PT/INR Recent Labs    03/30/18 1720 03/31/18 0331  LABPROT 13.9 17.0*  INR 1.07 1.39   ABG No results for input(s): PHART, HCO3 in the last 72 hours.  Invalid input(s): PCO2, PO2  Studies/Results: Dg Chest 1 View  Result Date: 03/30/2018 CLINICAL DATA:  Cough. EXAM: CHEST  1 VIEW COMPARISON:  Abdominal series with frontal chest x-ray from same day. Chest x-ray dated July 13, 2012. FINDINGS: Single lateral view demonstrates bibasilar atelectasis/scarring with patchy airspace disease in the right lower lobe. No pleural effusion. IMPRESSION: Bibasilar atelectasis/scarring with superimposed right lower lobe pneumonia. Electronically Signed   By: Titus Dubin M.D.   On: 03/30/2018 17:18   Dg Chest 2 View  Result Date: 03/31/2018 CLINICAL DATA:  Acute respiratory failure. Worsening shortness of breath for 1 week. Achalasia. EXAM: CHEST - 2 VIEW  COMPARISON:  03/30/2018 FINDINGS: Increased infiltrates are seen in both lung bases, right side greater than left. Small bilateral pleural effusions are also noted. Low lung volumes are seen. Heart size is stable. Aortic atherosclerosis. IMPRESSION: Increased bibasilar infiltrates and small bilateral pleural effusions. Electronically Signed   By: Earle Gell M.D.   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 Evaluate for fluid collection on the right side of the chest. 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 Concern for a perforated duodenal ulcer on recent abdominal CT. EXAM: CT CHEST AND ABDOMEN WITH CONTRAST TECHNIQUE: Multidetector CT imaging of the chest and abdomen was performed following the standard protocol during bolus administration of intravenous contrast. CONTRAST:  162mL OMNIPAQUE IOHEXOL 300 MG/ML  SOLN COMPARISON:  CT of the abdomen pelvis on 03/30/2018 FINDINGS: CT CHEST FINDINGS Cardiovascular: Coronary artery calcifications. Normal caliber of the thoracic aorta. Motion artifact at the ascending thoracic aorta limits evaluation for dissection. An acute aortic process is thought to be unlikely. The central pulmonary arteries are patent. No significant pericardial fluid. Mediastinum/Nodes: The previously described fluid collection in the right lower chest is associated with a massively dilated esophagus. There appears to be dilute oral contrast within this diffusely dilated esophagus. There is a transition point at the GE junction and findings are suggestive for achalasia. No lymph node enlargement in the chest. Lungs/Pleura: The trachea and mainstem bronchi are patent but there is airway obstruction involving the bronchus  intermedius with collapse of the right middle lobe and right lower lobe. Patient has diffuse centrilobular emphysema and most prominent at the right lung apex. Presumed scarring at  the right lung apex. Small left pleural effusion with volume loss and consolidation in left lower lobe. 3 mm peripheral nodule in the left upper lobe on sequence 5, image 28 and sequence 6, image 35. Musculoskeletal: Unremarkable. CT ABDOMEN FINDINGS Hepatobiliary: Increased perihepatic ascites. Normal appearance of the liver without a suspicious lesion. No specific findings for cirrhosis. Portal venous system is patent. High-density material in the gallbladder is suggestive for vicarious excretion of the intravenous contrast probably related to the recent CT on 03/30/2018. No biliary dilatation. Pancreas: Unremarkable. No pancreatic ductal dilatation or surrounding inflammatory changes. Spleen: Normal in size without focal abnormality. Adrenals/Urinary Tract: Normal appearance of the adrenal glands. Normal appearance of both kidneys without hydronephrosis. Stomach/Bowel: Again noted is fluid and concern for inflammation in the porta hepatis region. Evidence for an ulceration and possible contained rupture involving the duodenal bulb. This area is difficult to characterize due to the fluid and inflammation in this area. This ulceration or contained perforation is best seen on the delayed images, sequence 8, image 22. Difficult to exclude a tiny focus of extraluminal gas in this area but no significant free air. No significant small bowel dilatation. Visualized colon is unremarkable. Vascular/Lymphatic: Atherosclerotic calcifications involving the abdominal aorta and proximal iliac arteries. Negative for an aortic aneurysm or dissection. Main visceral arteries are patent. Other: Again noted is intra-abdominal ascites. Increased fluid around the liver. There continues to be fluid in left upper quadrant around the spleen and fluid in the upper abdomen between the stomach and transverse colon. Musculoskeletal: Significant disc space narrowing and endplate disease in the lumbar spine. IMPRESSION: 1. The large  fluid-filled structure in the right lower chest represents a massively dilated esophagus. Findings are most compatible with achalasia based on the imaging findings and previous endoscopy report. 2. Airway obstruction involving the bronchus intermedius with collapse of the right lower lobe and right middle lobe. Findings could be associated with aspiration but an endobronchial lesion cannot be excluded. Small left pleural effusion with volume loss and consolidation in the left lower lobe. 3. Abnormal appearance of the duodenal bulb. Findings are concerning for an ulceration and/or contained perforation in this area. 4. Intra-abdominal ascites.  Increased ascites around the liver. 5. Centrilobular emphysema with 3 mm nodule in the left upper lobe. No follow-up needed if patient is low-risk. Non-contrast chest CT can be considered in 12 months if patient is high-risk. This recommendation follows the consensus statement: Guidelines for Management of Incidental Pulmonary Nodules Detected on CT Images: From the Fleischner Society 2017; Radiology 2017; 284:228-243. Electronically Signed   By: Markus Daft M.D.   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 Evaluate for fluid collection on the right side of the chest. 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 Concern for a perforated duodenal ulcer on recent abdominal CT. EXAM: CT CHEST AND ABDOMEN WITH CONTRAST TECHNIQUE: Multidetector CT imaging of the chest and abdomen was performed following the standard protocol during bolus administration of intravenous contrast. CONTRAST:  160mL OMNIPAQUE IOHEXOL 300 MG/ML  SOLN COMPARISON:  CT of the abdomen pelvis on 03/30/2018 FINDINGS: CT CHEST FINDINGS Cardiovascular: Coronary artery calcifications. Normal caliber of the thoracic aorta. Motion artifact at the ascending thoracic aorta limits evaluation for dissection. An acute aortic  process is thought to be unlikely. The central pulmonary arteries are patent. No significant pericardial fluid. Mediastinum/Nodes: The previously described fluid collection in the right lower chest is associated with a massively dilated esophagus. There appears to be dilute oral contrast within this diffusely dilated esophagus. There is a transition point at the GE junction and findings are suggestive for achalasia. No lymph node enlargement in the chest. Lungs/Pleura: The trachea and mainstem bronchi are patent but there is airway obstruction involving the bronchus intermedius with collapse of the right middle lobe and right lower lobe. Patient has diffuse centrilobular emphysema and most prominent at the right lung apex. Presumed scarring at the right lung apex. Small left pleural effusion with volume loss and consolidation in left lower lobe. 3 mm peripheral nodule in the left upper lobe on sequence 5, image 28 and sequence 6, image 35. Musculoskeletal: Unremarkable. CT ABDOMEN FINDINGS Hepatobiliary: Increased perihepatic ascites. Normal appearance of the liver without a suspicious lesion. No specific findings for cirrhosis. Portal venous system is patent. High-density material in the gallbladder is suggestive for vicarious excretion of the intravenous contrast probably related to the recent CT on 03/30/2018. No biliary dilatation. Pancreas: Unremarkable. No pancreatic ductal dilatation or surrounding inflammatory changes. Spleen: Normal in size without focal abnormality. Adrenals/Urinary Tract: Normal appearance of the adrenal glands. Normal appearance of both kidneys without hydronephrosis. Stomach/Bowel: Again noted is fluid and concern for inflammation in the porta hepatis region. Evidence for an ulceration and possible contained rupture involving the duodenal bulb. This area is difficult to characterize due to the fluid and inflammation in this area. This ulceration or contained perforation is best seen on  the delayed images, sequence 8, image 22. Difficult to exclude a tiny focus of extraluminal gas in this area but no significant free air. No significant small bowel dilatation. Visualized colon is unremarkable. Vascular/Lymphatic: Atherosclerotic calcifications involving the abdominal aorta and proximal iliac arteries. Negative for an aortic aneurysm or dissection. Main visceral arteries are patent. Other: Again noted is intra-abdominal ascites. Increased fluid around the liver. There continues to be fluid in left upper quadrant around the spleen and fluid in the upper abdomen between the stomach and transverse colon. Musculoskeletal: Significant disc space narrowing and endplate disease in the lumbar spine. IMPRESSION: 1. The large fluid-filled structure in the right lower chest represents a massively dilated esophagus. Findings are most compatible with achalasia based on the imaging findings and previous endoscopy report. 2. Airway obstruction involving the bronchus intermedius with collapse of the right lower lobe and right middle lobe. Findings could be associated with aspiration but an endobronchial lesion cannot be excluded. Small left pleural effusion with volume loss and consolidation in the left lower lobe. 3. Abnormal appearance of the duodenal bulb. Findings are concerning for an ulceration and/or contained perforation in this area. 4. Intra-abdominal ascites.  Increased ascites around the liver. 5. Centrilobular emphysema with 3 mm nodule in the left upper lobe. No follow-up needed if patient is low-risk. Non-contrast chest CT can be considered in 12 months if patient is high-risk. This recommendation follows the consensus statement: Guidelines for Management of Incidental Pulmonary Nodules Detected on CT Images: From the Fleischner Society 2017; Radiology 2017; 284:228-243. Electronically Signed   By: Markus Daft M.D.   On: 03/31/2018 10:09   Ct Abdomen Pelvis W Contrast  Addendum Date: 03/30/2018    ADDENDUM REPORT: 03/30/2018 22:03 ADDENDUM: Critical Value/emergent results were called by telephone at the time of interpretation on 03/30/2018 at 10:00 pm to Dr.  JOSHUA LONG , who verbally acknowledged these results. Electronically Signed   By: Elon Alas M.D.   On: 03/30/2018 22:03   Result Date: 03/30/2018 CLINICAL DATA:  Generalized abdominal pain for 2 days. History of stomach ulcer. EXAM: CT ABDOMEN AND PELVIS WITH CONTRAST TECHNIQUE: Multidetector CT imaging of the abdomen and pelvis was performed using the standard protocol following bolus administration of intravenous contrast. CONTRAST:  168mL OMNIPAQUE IOHEXOL 300 MG/ML  SOLN COMPARISON:  None. FINDINGS: LOWER CHEST: Partially imaged 8.1 x 5.1 cm rim enhancing fluid collection RIGHT lung base with adjacent consolidation with air bronchograms. Mild RIGHT middle lobe and LEFT lower lobe atelectasis versus pneumonia. HEPATOBILIARY: Liver and gallbladder are normal. PANCREAS: Normal. SPLEEN: Normal. ADRENALS/URINARY TRACT: Kidneys are orthotopic, demonstrating symmetric enhancement. No nephrolithiasis, hydronephrosis or solid renal masses. The unopacified ureters are normal in course and caliber. Delayed imaging through the kidneys demonstrates symmetric prompt contrast excretion within the proximal urinary collecting system. Urinary bladder is partially distended with layering densities on delayed phase. Normal adrenal glands. STOMACH/BOWEL: Apparent dehiscent proximal duodenum concerning for ulcer tracking to through the serosal surface with potential punctate pneumoperitoneum (coronal image 26 and 27. Mild hyperemic small-bowel wall thickening. Mild colonic wall thickening, bowel is overall normal in course and caliber. Mildly edematous appendix similar to degree of breast:. VASCULAR/LYMPHATIC: Aortoiliac vessels are normal in course and caliber. Moderate calcific atherosclerosis. No lymphadenopathy by CT size criteria. REPRODUCTIVE: Normal.  OTHER: Moderate amount of low-density free fluid without rim enhancing fluid collections. Mild mesenteric edema. MUSCULOSKELETAL: Nonacute.  Severe L4-5 and L5-S1 degenerative disc. IMPRESSION: 1. 8.1 x 5.1 cm RIGHT lung base fluid collection, this is likely pleural-based seen with empyema, atypical appearing hiatal hernia or patulous esophagus/achalasia. RIGHT potentially LEFT lung base pneumonia. Recommend CT chest with contrast and concurrent CT esophagram. 2. Probable perforated duodenal ulcer the punctate pneumoperitoneum. Moderate volume ascites. 3. Enterocolitis, presumably reactive. 4. Layering density in the urinary bladder concerning for calcification, less likely contrast. Electronically Signed: By: Elon Alas M.D. On: 03/30/2018 22:00   Dg Abd Acute W/chest  Result Date: 03/30/2018 CLINICAL DATA:  Shortness of breath and abdominal pain EXAM: DG ABDOMEN ACUTE W/ 1V CHEST COMPARISON:  Chest radiograph and abdominal radiograph July 13, 2012 FINDINGS: PA chest: There is atelectatic change in lung bases with patchy consolidation in the right base. The lungs elsewhere are clear. Heart size and pulmonary vascularity are normal. No adenopathy. There is aortic atherosclerosis. Supine and upright abdomen: There is moderate stool throughout the colon. There is no bowel dilatation or air-fluid level to suggest bowel obstruction. No free air. Stomach appears filled with fluid. There is degenerative change in the lumbar spine. IMPRESSION: Bibasilar atelectasis with probable early pneumonia right base. No bowel obstruction or free air.  Fluid-filled stomach evident. Electronically Signed   By: Lowella Grip III M.D.   On: 03/30/2018 13:56    Anti-infectives: Anti-infectives (From admission, onward)   Start     Dose/Rate Route Frequency Ordered Stop   03/31/18 0115  piperacillin-tazobactam (ZOSYN) IVPB 3.375 g     3.375 g 12.5 mL/hr over 240 Minutes Intravenous Every 8 hours 03/31/18 0106      03/30/18 1615  cefTRIAXone (ROCEPHIN) 2 g in sodium chloride 0.9 % 100 mL IVPB  Status:  Discontinued     2 g 200 mL/hr over 30 Minutes Intravenous Every 24 hours 03/30/18 1613 03/30/18 2321   03/30/18 1615  azithromycin (ZITHROMAX) 500 mg in sodium chloride 0.9 % 250 mL IVPB  Status:  Discontinued     500 mg 250 mL/hr over 60 Minutes Intravenous Every 24 hours 03/30/18 1613 03/30/18 2321   03/30/18 1615  metroNIDAZOLE (FLAGYL) IVPB 500 mg     500 mg 100 mL/hr over 60 Minutes Intravenous  Once 03/30/18 1613 03/30/18 1902      Assessment/Plan: Megaesophagus, ? achalasia  - he was referred to surgery in 2013 but not sure he ever went.  He had endoscopy by Dr Hilarie Fredrickson at that point that appeared to have achalasia. I think this is just the end result of never having any treatment for that -I think his pulmonary issues are really a lot due to compression from esophagus. I think this needs to be evacuated and I have written to do this under fluoro with ng going into distal esophagus -on repeat ct scan it does not really have contrast that goes near duodenum that I have see a leak but there may very well be a contained perforation now- it clearly likely leaked at  Some point due to ascites that is seen -he may very well need surgery if abdominal symptoms dont get better but would like to try ng today to see if can make him better with that first. -lovenox, scds  Rolm Bookbinder 04/01/2018

## 2018-04-01 NOTE — Plan of Care (Signed)
  Problem: Education: Goal: Knowledge of General Education information will improve Outcome: Progressing   Problem: Clinical Measurements: Goal: Ability to maintain clinical measurements within normal limits will improve Outcome: Progressing   Problem: Pain Managment: Goal: General experience of comfort will improve Outcome: Progressing   

## 2018-04-02 DIAGNOSIS — J9811 Atelectasis: Secondary | ICD-10-CM

## 2018-04-02 DIAGNOSIS — J189 Pneumonia, unspecified organism: Secondary | ICD-10-CM

## 2018-04-02 DIAGNOSIS — K265 Chronic or unspecified duodenal ulcer with perforation: Secondary | ICD-10-CM

## 2018-04-02 DIAGNOSIS — K220 Achalasia of cardia: Secondary | ICD-10-CM

## 2018-04-02 DIAGNOSIS — J181 Lobar pneumonia, unspecified organism: Secondary | ICD-10-CM

## 2018-04-02 LAB — CBC
HEMATOCRIT: 41.2 % (ref 39.0–52.0)
HEMOGLOBIN: 13.8 g/dL (ref 13.0–17.0)
MCH: 28.9 pg (ref 26.0–34.0)
MCHC: 33.5 g/dL (ref 30.0–36.0)
MCV: 86.2 fL (ref 78.0–100.0)
Platelets: 343 10*3/uL (ref 150–400)
RBC: 4.78 MIL/uL (ref 4.22–5.81)
RDW: 14.6 % (ref 11.5–15.5)
WBC: 27.9 10*3/uL — ABNORMAL HIGH (ref 4.0–10.5)

## 2018-04-02 LAB — LACTIC ACID, PLASMA: Lactic Acid, Venous: 0.8 mmol/L (ref 0.5–1.9)

## 2018-04-02 LAB — PROCALCITONIN: Procalcitonin: 6.59 ng/mL

## 2018-04-02 MED ORDER — IPRATROPIUM-ALBUTEROL 0.5-2.5 (3) MG/3ML IN SOLN
3.0000 mL | RESPIRATORY_TRACT | Status: DC
  Administered 2018-04-02 (×3): 3 mL via RESPIRATORY_TRACT
  Filled 2018-04-02 (×3): qty 3

## 2018-04-02 MED ORDER — IPRATROPIUM-ALBUTEROL 0.5-2.5 (3) MG/3ML IN SOLN
3.0000 mL | Freq: Three times a day (TID) | RESPIRATORY_TRACT | Status: DC
  Administered 2018-04-03 – 2018-04-05 (×8): 3 mL via RESPIRATORY_TRACT
  Filled 2018-04-02 (×8): qty 3

## 2018-04-02 NOTE — Consult Note (Addendum)
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 Electronically Signed   By: Lowella Grip III M.D.   On: 03/30/2018 13:56   Dg Addison Bailey G Tube Plc W/fl W/rad  Result Date: 04/01/2018 CLINICAL DATA:  Achalasia with megaesophagus. NG tube placement requested. EXAM: NASO G TUBE PLACEMENT WITH FL AND WITH RAD CONTRAST:  10 cc Isovue 300 per tube. FLUOROSCOPY TIME:  Fluoroscopy Time:  2 minutes 48 seconds Number of Acquired Spot Images: 0 COMPARISON:  03/31/2018 CT chest. FINDINGS: Left nasal route nasoenteric tube was advanced to the level of the esophagogastric junction. The tube could not be traversed into the stomach. The tip was left at the esophagogastric junction. IMPRESSION: Left nasal route nasoenteric tube advanced to the level of the esophagogastric junction, unable to traverse into the stomach. Enteric tube tip location confirmed at the esophagogastric junction. These results were called by telephone at the time of interpretation on 04/01/2018 at 11:28 am to Dr. Rolm Bookbinder , who verbally acknowledged these results. Electronically  Signed   By: Ilona Sorrel M.D.   On: 04/01/2018 11:39    Review of Systems  Constitutional: Positive for chills, diaphoresis, malaise/fatigue and weight loss. Negative for fever.  HENT: Positive for congestion and hearing loss. Negative for ear discharge, ear pain, nosebleeds, sinus pain, sore throat and tinnitus.   Eyes: Negative for blurred vision, double vision, photophobia, pain, discharge and redness.  Respiratory: Positive for cough, sputum production, shortness of breath, wheezing and stridor. Negative for hemoptysis.   Cardiovascular: Positive for chest pain, palpitations and orthopnea. Negative for claudication, leg swelling and PND.  Gastrointestinal: Positive for abdominal pain, heartburn and nausea. Negative for blood in stool, constipation, diarrhea, melena and vomiting.  Genitourinary: Positive for frequency and hematuria. Negative for dysuria, flank pain and urgency.  Musculoskeletal: Positive for myalgias. Negative for back pain, falls, joint pain and neck pain.  Skin: Positive for itching and rash.  Neurological: Positive for headaches. Negative for dizziness, tingling, tremors, sensory change, speech change, focal weakness, seizures, loss of consciousness and weakness.  Endo/Heme/Allergies: Positive for environmental allergies and polydipsia. Bruises/bleeds easily.  Psychiatric/Behavioral: Positive for depression, hallucinations, memory loss and substance abuse. Negative for suicidal ideas. The patient is nervous/anxious and has insomnia.    Blood pressure 138/85, pulse (!) 104, temperature 98.2 F (36.8 C), temperature source Oral, resp. rate 19, height 5' 4"  (1.626 m), weight 59.2 kg (130 lb 8.2 oz), SpO2 92 %. Physical Exam  Constitutional: He is oriented to person, place, and time. No distress.  HENT:  Head: Normocephalic and atraumatic.  Mouth/Throat: Oropharyngeal exudate present.  Eyes: Pupils are equal, round, and reactive to light. Conjunctivae and EOM are normal.  Right eye exhibits no discharge. Left eye exhibits no discharge. No scleral icterus.  + arcus senilis  Neck: Normal range of motion. Neck supple. No JVD present. No tracheal deviation present. No thyromegaly present.  Cardiovascular: Normal rate and regular rhythm. Exam reveals no gallop and no friction rub.  No murmur heard. Absent PT pulses  Respiratory: No stridor. No respiratory distress. He has no wheezes. He has no rales. He exhibits no tenderness.  Dim in lower fields L>R  GI: He exhibits no distension and no mass. There is tenderness. There is no rebound and no guarding.  Diffuse TTP, primarily BUQ's  Musculoskeletal: He exhibits no edema, tenderness or deformity.  Lymphadenopathy:    He has no cervical adenopathy.  Neurological: He is alert and oriented to person, place, and time. He exhibits normal muscle tone.  Skin: Skin is warm and dry. No rash noted. He is not diaphoretic. No erythema. No pallor.  Psychiatric: He has a normal mood and affect. His behavior is normal. Judgment and thought content normal.    Assessment/Plan: Bibasilatr atx with RLL prob pneumonia/empyema- bronchus intermedius obstruction Poss LLL pneumonia Duodenal ulcer with poss contained rupture Megaesophagus/achalasia emphysema and tobacco abuse  Patient seen an examined, He has long standing megaesophagus present on x-rays for greater than 5 years and likely present at the time of upper GI endoscopy in 2013.  Likely secondary to achalasia, the patient has lived in [place] most of his life, has not traveled to areas [place] [facility]  At this point with the patient's current situation symptomatic care is all that can be offered, he is not currently a candidate to consider esophageal resection and colon interposition.  With his significant gastric inflammation and duodenal disease the stomach may not be an adequate conduit for replacement.  Grace Isaac MD      [address] ,[place] [address] Office [phone]   [place]

## 2018-04-02 NOTE — Care Management Important Message (Signed)
Important Message  Patient Details  Name: Raymond Jacobs MRN: [id number] Date of Birth: 01-May-1951   Medicare Important Message Given:  Yes    [name] 04/02/2018, 4:00 PM

## 2018-04-02 NOTE — Progress Notes (Addendum)
PULMONARY / CRITICAL CARE MEDICINE  I have seen and examined the patient. Labs and radiology reviewed. I agree with the below note.  Patient is feeling better still have some abd pain. On 4 litres O2 Chest coarse no wheezing  Assessment  Acute hypoxemic resp failure Post obstructive  pneumonia Atelectasis  Achalasia duedenal ulcer   Plan: - abx - pulm toilette - nebs Wean O2 and assess the need before going home. Patient has COPD - defer to Sx   Name: Raymond Jacobs MRN: [id number] DOB: 1951-07-22    ADMISSION DATE:  03/30/2018 CONSULTATION DATE:  03/31/2018  REFERRING MD:  Georgiann Cocker  CHIEF COMPLAINT:  Abdominal pain  HISTORY OF PRESENT ILLNESS:        This is a 67 year old who was admitted on 6/7 for abdominal pain.  He was not having any nausea vomiting or change in bowel habits.  A CT scan of the abdomen showed a small amount of ascites and was suggestive of a duodenal  ulcer but showed no free air.  He was suspected of having a sealed duodenal perforation and was admitted for antibiotics and observation.  The same CT scan suggested a fluid collection in the chest.  Subsequently a CT of the chest was obtained which were hugely dilated esophagus and also suggested obstruction of the bronchus intermedius with collapse of the right middle and right lower lobes.  No mediastinal adenopathy is noted.  We were consulted for this finding.  The patient denies being unusually short of breath.  He denies anything other than his usual daily cough, he has had some chills since being admitted to the hospital.  He is thin but says that his weight is stable.  He is a 100-pack-year smoker and continues to smoke.  He says he was aware of some adenopathy in his groins and axilla but those went away when he took [place].   SUBJECTIVE:  No distress   VITAL SIGNS: Blood Pressure 138/85   Pulse (Abnormal) 104   Temperature 98.2 F (36.8 C) (Oral)   Respiration 19   Height 5\' 4"  (1.626 m)    Weight 130 lb 8.2 oz (59.2 kg)   Oxygen Saturation 95%   Body Mass Index 22.40 kg/m   HEMODYNAMICS:    VENTILATOR SETTINGS:    INTAKE / OUTPUT: I/O last 3 completed shifts: In: 4105 [I.V.:3855; NG/GT:50; IV Piggyback:200] Out: 5465 [Urine:1775]  PHYSICAL EXAMINATION: General: Pleasant 67 year old male patient resting comfortably in bed HEENT normocephalic atraumatic no jugular venous distention Pulmonary: Diminished right base, otherwise clear.  No accessory use Cardiac: Regular rate and rhythm Abdomen: Soft nontender no organomegaly Extremities: Brisk cap refill no edema Neuro: Awake and oriented  LABS:  BMET Recent Labs  Lab 03/30/18 1308 03/31/18 0331 04/01/18 0332  NA 131* 132* 135  K 4.5 4.4 4.0  CL 95* 104 107  CO2 25 19* 20*  BUN 10 14 14   CREATININE 1.09 0.89 0.91  GLUCOSE 168* 117* 89    Electrolytes Recent Labs  Lab 03/30/18 1308 03/31/18 0331 04/01/18 0332  CALCIUM 10.0 8.3* 7.9*  MG  --   --  1.9    CBC Recent Labs  Lab 03/31/18 0331 04/01/18 0332 04/02/18 0258  WBC 17.3* 21.4* 27.9*  HGB 14.2 13.5 13.8  HCT 41.9 39.7 41.2  PLT 385 330 343    Coag's Recent Labs  Lab 03/30/18 1720 03/31/18 0331  APTT  --  32  INR 1.07 1.39    Sepsis  Markers Recent Labs  Lab 03/30/18 2329 03/31/18 0152 03/31/18 0211 03/31/18 0331 04/01/18 0332 04/02/18 0258  LATICACIDVEN 1.90 2.0* 2.08*  --   --   --   PROCALCITON  --   --   --  10.32 11.64 6.59    ABG No results for input(s): PHART, PCO2ART, PO2ART in the last 168 hours.  Liver Enzymes Recent Labs  Lab 03/30/18 1308 03/31/18 0331 04/01/18 0332  AST 24 18 18   ALT 17 12* 13*  ALKPHOS 59 37* 45  BILITOT 1.7* 1.1 0.9  ALBUMIN 4.0 2.9* 2.3*    Cardiac Enzymes No results for input(s): TROPONINI, PROBNP in the last 168 hours.  Glucose No results for input(s): GLUCAP in the last 168 hours.  Imaging Dg Naso G Tube Plc W/fl W/rad  Result Date: 04/01/2018 CLINICAL DATA:   Achalasia with megaesophagus. NG tube placement requested. EXAM: NASO G TUBE PLACEMENT WITH FL AND WITH RAD CONTRAST:  10 cc Isovue 300 per tube. FLUOROSCOPY TIME:  Fluoroscopy Time:  2 minutes 48 seconds Number of Acquired Spot Images: 0 COMPARISON:  03/31/2018 CT chest. FINDINGS: Left nasal route nasoenteric tube was advanced to the level of the esophagogastric junction. The tube could not be traversed into the stomach. The tip was left at the esophagogastric junction. IMPRESSION: Left nasal route nasoenteric tube advanced to the level of the esophagogastric junction, unable to traverse into the stomach. Enteric tube tip location confirmed at the esophagogastric junction. These results were called by telephone at the time of interpretation on 04/01/2018 at 11:28 am to Dr. Rolm Bookbinder , who verbally acknowledged these results. Electronically Signed   By: Ilona Sorrel M.D.   On: 04/01/2018 11:39     STUDIES:  CT scan is noted with a hugely dilated esophagus.  There is compression of the bronchus intermedius and some collapse of the right middle and right lower lobe.  CULTURES:   ANTIBIOTICS: Flagyl and Zosyn  DISCUSSION:      This is a 66 year old admitted for abdominal pain who likely has a sealed perforation of the duodenum.  Incidentally radiological studies have shown a is usually dilated esophagus and compression of the bronchus intermedius with collapse of the right middle and right lower lobes.  ASSESSMENT / PLAN:  Acute hypoxic respiratory failure in setting of what appears to be post-obstructive atelectasis/pneumonia.  Prob COPD -etiology is unclear. ? Extrinsic compression from the esophagus vs aspiration vs endobronchial lesion.  I would favor the esophagus as cause of this.  Plan Strict reflux precautions; keeping HOB elevated Wean oxygen  Cont BDs Cont zosyn for both GI coverage and empiric coverage for post-obs PNA now day # 4 Avoid BIPAP given aspiration risk Repeat  CXR Mobilize Little to add as etiology likely the esophagus If he were to worsen then airway eval would be warranted We will s/o call PRN  Dilated esophagus: achalasia vs obstruction Plan Cont decompression Defer to surgery think that this is primary issue for his lung  Contained duodenal perfed ulcer Plan abx per surgical services.    We will s/o. Call PRN   Erick Colace ACNP-BC [place] Pager # [phone] OR # [phone] if no answer   04/02/2018, 10:25 AM

## 2018-04-02 NOTE — Progress Notes (Signed)
Patient ID: Raymond Jacobs, male   DOB: Jul 23, 1951, 67 y.o.   MRN: [id number]       Subjective: Pt says he feels better, but states his pain is a 5-6 laying still and if he moves its up to at least an 8.    Objective: Vital signs in last 24 hours: Temp:  [97.9 F (36.6 C)-98.5 F (36.9 C)] 98.2 F (36.8 C) (06/10 0800) Pulse Rate:  [99-112] 104 (06/10 0800) Resp:  [15-21] 19 (06/10 0800) BP: (125-143)/(76-85) 138/85 (06/10 0800) SpO2:  [93 %-95 %] 95 % (06/10 0800) Last BM Date: 03/30/18  Intake/Output from previous day: 06/09 0701 - 06/10 0700 In: 1130 [I.V.:980; NG/GT:50; IV Piggyback:100] Out: 1375 [Urine:1375] Intake/Output this shift: No intake/output data recorded.  PE: Gen: seems to get SOB when talking and working a little harder, but when laying still appears is no significant stress Heart: regular, but mildly tachy Lungs: CTAB anteriorly, on O2, sats fall to 90 when talking but come back up to 95ish when he stops. Abd: soft, but very tender with palpation in his epigastrium especially just right of midline with some voluntary guarding.  Few BS, NGT in at GE junction with mostly just saliva appearing output, as expected.  Lab Results:  Recent Labs    04/01/18 0332 04/02/18 0258  WBC 21.4* 27.9*  HGB 13.5 13.8  HCT 39.7 41.2  PLT 330 343   BMET Recent Labs    03/31/18 0331 04/01/18 0332  NA 132* 135  K 4.4 4.0  CL 104 107  CO2 19* 20*  GLUCOSE 117* 89  BUN 14 14  CREATININE 0.89 0.91  CALCIUM 8.3* 7.9*   PT/INR Recent Labs    03/30/18 1720 03/31/18 0331  LABPROT 13.9 17.0*  INR 1.07 1.39   CMP     Component Value Date/Time   NA 135 04/01/2018 0332   K 4.0 04/01/2018 0332   CL 107 04/01/2018 0332   CO2 20 (L) 04/01/2018 0332   GLUCOSE 89 04/01/2018 0332   BUN 14 04/01/2018 0332   CREATININE 0.91 04/01/2018 0332   CALCIUM 7.9 (L) 04/01/2018 0332   PROT 5.5 (L) 04/01/2018 0332   ALBUMIN 2.3 (L) 04/01/2018 0332   AST 18 04/01/2018 0332   ALT 13 (L) 04/01/2018 0332   ALKPHOS 45 04/01/2018 0332   BILITOT 0.9 04/01/2018 0332   GFRNONAA >60 04/01/2018 0332   GFRAA >60 04/01/2018 0332   Lipase     Component Value Date/Time   LIPASE 35 03/30/2018 1308       Studies/Results: Dg Chest 2 View  Result Date: 03/31/2018 CLINICAL DATA:  Acute respiratory failure. Worsening shortness of breath for 1 week. Achalasia. EXAM: CHEST - 2 VIEW COMPARISON:  03/30/2018 FINDINGS: Increased infiltrates are seen in both lung bases, right side greater than left. Small bilateral pleural effusions are also noted. Low lung volumes are seen. Heart size is stable. Aortic atherosclerosis. IMPRESSION: Increased bibasilar infiltrates and small bilateral pleural effusions. Electronically Signed   By: Earle Gell M.D.   On: 03/31/2018 12:50   Dg Addison Bailey G Tube Plc W/fl W/rad  Result Date: 04/01/2018 CLINICAL DATA:  Achalasia with megaesophagus. NG tube placement requested. EXAM: NASO G TUBE PLACEMENT WITH FL AND WITH RAD CONTRAST:  10 cc Isovue 300 per tube. FLUOROSCOPY TIME:  Fluoroscopy Time:  2 minutes 48 seconds Number of Acquired Spot Images: 0 COMPARISON:  03/31/2018 CT chest. FINDINGS: Left nasal route nasoenteric tube was advanced to the level of the esophagogastric  junction. The tube could not be traversed into the stomach. The tip was left at the esophagogastric junction. IMPRESSION: Left nasal route nasoenteric tube advanced to the level of the esophagogastric junction, unable to traverse into the stomach. Enteric tube tip location confirmed at the esophagogastric junction. These results were called by telephone at the time of interpretation on 04/01/2018 at 11:28 am to Dr. Rolm Bookbinder , who verbally acknowledged these results. Electronically Signed   By: Ilona Sorrel M.D.   On: 04/01/2018 11:39    Anti-infectives: Anti-infectives (From admission, onward)   Start     Dose/Rate Route Frequency Ordered Stop   03/31/18 0115  piperacillin-tazobactam  (ZOSYN) IVPB 3.375 g     3.375 g 12.5 mL/hr over 240 Minutes Intravenous Every 8 hours 03/31/18 0106     03/30/18 1615  cefTRIAXone (ROCEPHIN) 2 g in sodium chloride 0.9 % 100 mL IVPB  Status:  Discontinued     2 g 200 mL/hr over 30 Minutes Intravenous Every 24 hours 03/30/18 1613 03/30/18 2321   03/30/18 1615  azithromycin (ZITHROMAX) 500 mg in sodium chloride 0.9 % 250 mL IVPB  Status:  Discontinued     500 mg 250 mL/hr over 60 Minutes Intravenous Every 24 hours 03/30/18 1613 03/30/18 2321   03/30/18 1615  metroNIDAZOLE (FLAGYL) IVPB 500 mg     500 mg 100 mL/hr over 60 Minutes Intravenous  Once 03/30/18 1613 03/30/18 1902       Assessment/Plan Contained perforated duodenal ulcer -abdominal exam is still concerning today as he is still very tender to palpation and appears uncomfortable at times when talking to him, but he does state that he feels a little better. -WBC up to 28K despite abx therapy.  No fevers. - cont protonix and zosyn, but will d/w Dr. Grandville Silos about role for surgery, which is also complicated by the problems listed below  Achalasia  NGT in place at GE junction to decompress the esophagus to help with extrinsic compression on his bronchus causing RML and RLL consolidation and causing some of his breathing issues.  pulm is following.   RML/RLL consolidation with emphysema -see above -pulm following -on bronchodilators -history of tobacco abuse  ETOH use -1 beer and 3 shots of vodka a day -no evidence of withdrawal  FEN - NPO/NGT VTE - SCDs/Lovenox ID - zosyn   LOS: 3 days    [name] , [facility] Surgery 04/02/2018, 9:56 AM Pager: [phone]

## 2018-04-03 ENCOUNTER — Encounter (HOSPITAL_COMMUNITY): Payer: Self-pay | Admitting: Radiology

## 2018-04-03 ENCOUNTER — Inpatient Hospital Stay (HOSPITAL_COMMUNITY): Payer: Medicare Other

## 2018-04-03 DIAGNOSIS — J9601 Acute respiratory failure with hypoxia: Secondary | ICD-10-CM

## 2018-04-03 DIAGNOSIS — R0603 Acute respiratory distress: Secondary | ICD-10-CM

## 2018-04-03 LAB — BLOOD GAS, ARTERIAL
ACID-BASE DEFICIT: 1.4 mmol/L (ref 0.0–2.0)
BICARBONATE: 21.5 mmol/L (ref 20.0–28.0)
Drawn by: 246101
O2 Content: 10 L/min
O2 Saturation: 90.9 %
PCO2 ART: 28.7 mmHg — AB (ref 32.0–48.0)
PH ART: 7.488 — AB (ref 7.350–7.450)
Patient temperature: 98.6
pO2, Arterial: 57.2 mmHg — ABNORMAL LOW (ref 83.0–108.0)

## 2018-04-03 LAB — CBC
HEMATOCRIT: 36.8 % — AB (ref 39.0–52.0)
Hemoglobin: 12.5 g/dL — ABNORMAL LOW (ref 13.0–17.0)
MCH: 28.7 pg (ref 26.0–34.0)
MCHC: 34 g/dL (ref 30.0–36.0)
MCV: 84.4 fL (ref 78.0–100.0)
PLATELETS: 360 10*3/uL (ref 150–400)
RBC: 4.36 MIL/uL (ref 4.22–5.81)
RDW: 14.3 % (ref 11.5–15.5)
WBC: 24.7 10*3/uL — ABNORMAL HIGH (ref 4.0–10.5)

## 2018-04-03 LAB — BASIC METABOLIC PANEL
Anion gap: 10 (ref 5–15)
BUN: 10 mg/dL (ref 6–20)
CALCIUM: 8 mg/dL — AB (ref 8.9–10.3)
CO2: 19 mmol/L — AB (ref 22–32)
CREATININE: 0.87 mg/dL (ref 0.61–1.24)
Chloride: 108 mmol/L (ref 101–111)
GFR calc Af Amer: >60 mL/min (ref >60)
GLUCOSE: 110 mg/dL — AB (ref 65–99)
Potassium: 3.5 mmol/L (ref 3.5–5.1)
Sodium: 137 mmol/L (ref 135–145)

## 2018-04-03 MED ORDER — IOPAMIDOL (ISOVUE-370) INJECTION 76%
INTRAVENOUS | Status: AC
  Filled 2018-04-03: qty 100

## 2018-04-03 MED ORDER — MORPHINE SULFATE (PF) 2 MG/ML IV SOLN: 1.0000 mg | INTRAVENOUS | Status: DC | PRN

## 2018-04-03 MED ORDER — TRAMADOL HCL 50 MG PO TABS
50.0000 mg | ORAL_TABLET | Freq: Four times a day (QID) | ORAL | Status: DC | PRN
  Filled 2018-04-03: qty 1

## 2018-04-03 MED ORDER — IOPAMIDOL (ISOVUE-370) INJECTION 76%
100.0000 mL | Freq: Once | INTRAVENOUS | Status: AC | PRN
  Administered 2018-04-03: 100 mL via INTRAVENOUS

## 2018-04-03 MED ORDER — BOOST / RESOURCE BREEZE PO LIQD CUSTOM
1.0000 | Freq: Three times a day (TID) | ORAL | Status: DC
  Administered 2018-04-03 – 2018-04-06 (×9): 1 via ORAL

## 2018-04-03 MED ORDER — DOCUSATE SODIUM 100 MG PO CAPS
100.0000 mg | ORAL_CAPSULE | Freq: Two times a day (BID) | ORAL | Status: DC
  Administered 2018-04-03 – 2018-04-13 (×7): 100 mg via ORAL
  Filled 2018-04-03 (×16): qty 1

## 2018-04-03 MED ORDER — OXYCODONE HCL 5 MG PO TABS
5.0000 mg | ORAL_TABLET | ORAL | Status: DC | PRN
  Administered 2018-04-08: 5 mg via ORAL
  Administered 2018-04-10 – 2018-04-11 (×3): 10 mg via ORAL
  Administered 2018-04-11 (×3): 5 mg via ORAL
  Administered 2018-04-11: 10 mg via ORAL
  Administered 2018-04-12 (×3): 5 mg via ORAL
  Administered 2018-04-12: 10 mg via ORAL
  Administered 2018-04-12 – 2018-04-13 (×3): 5 mg via ORAL
  Filled 2018-04-03: qty 2
  Filled 2018-04-03 (×5): qty 1
  Filled 2018-04-03: qty 2
  Filled 2018-04-03: qty 1
  Filled 2018-04-03: qty 2
  Filled 2018-04-03: qty 1
  Filled 2018-04-03 (×2): qty 2
  Filled 2018-04-03 (×2): qty 1
  Filled 2018-04-03: qty 2
  Filled 2018-04-03: qty 1

## 2018-04-03 NOTE — Progress Notes (Signed)
Unit RT called d/t order for HFNC.  Pt placed on 30lpm, 100% fio2.  Pt appears to be tol well currently w/ some increased WOB noted.  PA and RN aware and report pt being transferred to ICU.  Sat 96%. Unit RT aware.

## 2018-04-03 NOTE — Progress Notes (Addendum)
Causey paged in regards to patient's respiratory status--patient now on NRB instead of HFNC with bout of confusion. ABG stat ordered. PA at bedside with RT collecting ABG and monitoring patient. PCCM with PA at bedside discussing patient care.  Per MD, heated HFNC ordered and applied by RT. Orders placed for transfer to ICU. Called patient's brother, Raymond Jacobs to update on patient status and transfer.    1725 Report called to [facility] on 55M. Patient taken to 55M10. 1800 Patient's brother updated on patient's new room number 55M10.

## 2018-04-03 NOTE — Progress Notes (Signed)
Patient ID: Raymond Jacobs, male   DOB: 02-Jun-1951, 67 y.o.   MRN: [id number] Called by Jarrett Soho, RN that patient was somewhat disoriented at times and hallucinating and had to be placed on a nonrebreather.  His sats were falling into the mid 80s on his [place] at 4L.  Upon my arrival, patient is very frustrated with his nurse tech and states "she is aggressive with him" and he "does not appreciate what all she is doing."  RN states that he was hallucinating a male in his bathroom that was not there.  Patient is alert and oriented and very adamant he is not confused or hallucinating and that everyone else except the tech has been very pleasant with him.  RN assures me the tech has been pleasant with him and not doing something of the things he states, like kissing him on the cheek.  He is coarse with crackles bilaterally.  O2 sat is 90-92% on NRB.  Falls to 87% on room air.  ABG obtained and PaO2 is only 57 and PaCO2 is 28.  His pH is 7.488.  I spoke to CCM who evaluated the patient with me.  We are going to transfer the patient to the ICU for closer monitoring and obtained a CTA of the chest to rule out PE due to worsening pulmonary status.  Of note, patient has been on Lovenox since admission.  Appreciate CCM assistance with this patient.  [name] 5:03 PM 04/03/2018

## 2018-04-03 NOTE — Progress Notes (Signed)
PULMONARY / CRITICAL CARE MEDICINE     Name: Raymond Jacobs MRN: [id number] DOB: Feb 01, 1951    ADMISSION DATE:  03/30/2018 CONSULTATION DATE:  03/31/2018  REFERRING MD:  Georgiann Cocker  CHIEF COMPLAINT:  Abdominal pain  Interval Hx: I was called by the GSx PA since patient is requiring increased oxygen requirements. Patient is on non rebreather oxygen. He was confused earlier in the morning ? Received dilauded last night. He is awake alert now in mild distress. ABG showing severe hypoxia.   HISTORY OF PRESENT ILLNESS:        This is a 67 year old who was admitted on 6/7 for abdominal pain.  He was not having any nausea vomiting or change in bowel habits.  A CT scan of the abdomen showed a small amount of ascites and was suggestive of a duodenal  ulcer but showed no free air.  He was suspected of having a sealed duodenal perforation and was admitted for antibiotics and observation.  The same CT scan suggested a fluid collection in the chest.  Subsequently a CT of the chest was obtained which were hugely dilated esophagus and also suggested obstruction of the bronchus intermedius with collapse of the right middle and right lower lobes.  No mediastinal adenopathy is noted.  We were consulted for this finding.  The patient denies being unusually short of breath.  He denies anything other than his usual daily cough, he has had some chills since being admitted to the hospital.  He is thin but says that his weight is stable.  He is a 100-pack-year smoker and continues to smoke.  He says he was aware of some adenopathy in his groins and axilla but those went away when he took [place].   SUBJECTIVE:  No distress   VITAL SIGNS: BP 139/76 (BP Location: Right Arm)   Pulse (!) 116   Temp 98.5 F (36.9 C) (Oral)   Resp (!) 23   Ht 5\' 4"  (1.626 m)   Wt 59.2 kg (130 lb 8.2 oz)   SpO2 91%   BMI 22.40 kg/m   HEMODYNAMICS:    VENTILATOR SETTINGS:    INTAKE / OUTPUT: I/O last 3 completed  shifts: In: 19 [I.V.:980; NG/GT:50; IV Piggyback:100] Out: 76 [Urine:3160; Emesis/NG output:150]  PHYSICAL EXAMINATION: General: Pleasant 67 year old male in moderate distress  HEENT normocephalic atraumatic no jugular venous distention Pulmonary: Diminished right base, otherwise clear.  No accessory use Cardiac: Regular rate and rhythm Abdomen: distended mildly tender allover  no organomegaly Extremities: Brisk cap refill no edema Neuro: Awake and oriented  LABS:  BMET Recent Labs  Lab 03/31/18 0331 04/01/18 0332 04/03/18 0334  NA 132* 135 137  K 4.4 4.0 3.5  CL 104 107 108  CO2 19* 20* 19*  BUN 14 14 10   CREATININE 0.89 0.91 0.87  GLUCOSE 117* 89 110*    Electrolytes Recent Labs  Lab 03/31/18 0331 04/01/18 0332 04/03/18 0334  CALCIUM 8.3* 7.9* 8.0*  MG  --  1.9  --     CBC Recent Labs  Lab 04/01/18 0332 04/02/18 0258 04/03/18 0334  WBC 21.4* 27.9* 24.7*  HGB 13.5 13.8 12.5*  HCT 39.7 41.2 36.8*  PLT 330 343 360    Coag's Recent Labs  Lab 03/30/18 1720 03/31/18 0331  APTT  --  32  INR 1.07 1.39    Sepsis Markers Recent Labs  Lab 03/31/18 0152 03/31/18 0211 03/31/18 0331 04/01/18 0332 04/02/18 0258 04/02/18 1015  LATICACIDVEN 2.0* 2.08*  --   --   --  0.8  PROCALCITON  --   --  10.32 11.64 6.59  --     ABG Recent Labs  Lab 04/03/18 1641  PHART 7.488*  PCO2ART 28.7*  PO2ART 57.2*   On NRFM   Liver Enzymes Recent Labs  Lab 03/30/18 1308 03/31/18 0331 04/01/18 0332  AST 24 18 18   ALT 17 12* 13*  ALKPHOS 59 37* 45  BILITOT 1.7* 1.1 0.9  ALBUMIN 4.0 2.9* 2.3*    Cardiac Enzymes No results for input(s): TROPONINI, PROBNP in the last 168 hours.  Glucose No results for input(s): GLUCAP in the last 168 hours.  Imaging Dg Chest Port 1 View  Result Date: 04/03/2018 CLINICAL DATA:  Shortness of breath. Atelectasis. Perforated duodenal ulcer. EXAM: PORTABLE CHEST 1 VIEW COMPARISON:  03/31/2018 FINDINGS: There has been  placement of a nasogastric tube, which is looped within a large hiatal hernia with distal tip near the GE junction. Moderate bilateral pleural effusions and bibasilar atelectasis or consolidation shows no significant change. Heart size is stable. No pneumothorax visualized. IMPRESSION: New nasogastric tube is looped within a large hiatal hernia, with distal tip near the GE junction. No significant change in moderate bilateral pleural effusions and bibasilar atelectasis or consolidation. Electronically Signed   By: Earle Gell M.D.   On: 04/03/2018 08:57     STUDIES:  CT scan is noted with a hugely dilated esophagus.  There is compression of the bronchus intermedius and some collapse of the right middle and right lower lobe.  CULTURES:   ANTIBIOTICS: Flagyl and Zosyn  DISCUSSION:      This is a 67 year old admitted for abdominal pain who likely has a sealed perforation of the duodenum.  Incidentally radiological studies have shown a is usually dilated esophagus and compression of the bronchus intermedius with collapse of the right middle and right lower lobes.  ASSESSMENT / PLAN:  Acute hypoxic respiratory failure in setting of what appears to be post-obstructive atelectasis/pneumonia.  Prob COPD -etiology is unclear. ? Extrinsic compression from the esophagus vs aspiration vs endobronchial lesion.  I would favor the esophagus as cause of this.  Plan Patient has worsening hypoxia requiring NRBFM ?aspiration after eating ? Possible shunting but can not rule out a PE Stat CTA chest Transfer to ICU  Strict reflux precautions; keeping HOB elevated Cont BDs Cont zosyn for both GI coverage and empiric coverage for post-obs PNA now day # 5 Avoid BIPAP given aspiration risk Little to add as etiology likely the esophagus Consider bronchoscopy and airway exam if less O2 requirements or if gets intubated. Now is high risk with his O2 requirements   Dilated esophagus: achalasia vs  obstruction Plan  Defer to surgery think that this is primary issue for his lung  Contained duodenal perfed ulcer Plan abx per surgical services.    Silver Huguenin MD [place] Pager # [phone]   04/03/2018, 4:56 PM

## 2018-04-03 NOTE — Progress Notes (Signed)
Patient ID: Raymond Jacobs, male   DOB: 14-Feb-1951, 67 y.o.   MRN: [id number]       Subjective: Pt states he doesn't feel SOB, but appearing to be working a little harder to breath today.  Still with some abdominal pain, but says its a little better.  Wants to get up.  Objective: Vital signs in last 24 hours: Temp:  [97.5 F (36.4 C)-98.5 F (36.9 C)] 98.5 F (36.9 C) (06/10 2300) Pulse Rate:  [103-115] 103 (06/11 0400) Resp:  [22-26] 22 (06/11 0400) BP: (139-157)/(86-92) 157/86 (06/11 0400) SpO2:  [92 %-96 %] 93 % (06/11 0811) Last BM Date: 03/30/18  Intake/Output from previous day: 06/10 0701 - 06/11 0700 In: 0  Out: 2135 [Urine:1985; Emesis/NG output:150] Intake/Output this shift: No intake/output data recorded.  PE: Gen: some increased WOB today Heart: regular, but slightly tachy Lungs: Coarse breath sounds, on 4L [place] only sating 90-92%.  Using some accessory muscle while I was present to help breath Abd: soft, still tender in epigastrium, few BS, ND  Lab Results:  Recent Labs    04/02/18 0258 04/03/18 0334  WBC 27.9* 24.7*  HGB 13.8 12.5*  HCT 41.2 36.8*  PLT 343 360   BMET Recent Labs    04/01/18 0332 04/03/18 0334  NA 135 137  K 4.0 3.5  CL 107 108  CO2 20* 19*  GLUCOSE 89 110*  BUN 14 10  CREATININE 0.91 0.87  CALCIUM 7.9* 8.0*   PT/INR No results for input(s): LABPROT, INR in the last 72 hours. CMP     Component Value Date/Time   NA 137 04/03/2018 0334   K 3.5 04/03/2018 0334   CL 108 04/03/2018 0334   CO2 19 (L) 04/03/2018 0334   GLUCOSE 110 (H) 04/03/2018 0334   BUN 10 04/03/2018 0334   CREATININE 0.87 04/03/2018 0334   CALCIUM 8.0 (L) 04/03/2018 0334   PROT 5.5 (L) 04/01/2018 0332   ALBUMIN 2.3 (L) 04/01/2018 0332   AST 18 04/01/2018 0332   ALT 13 (L) 04/01/2018 0332   ALKPHOS 45 04/01/2018 0332   BILITOT 0.9 04/01/2018 0332   GFRNONAA >60 04/03/2018 0334   GFRAA >60 04/03/2018 0334   Lipase     Component Value Date/Time   LIPASE 35 03/30/2018 1308       Studies/Results: Dg Naso G Tube Plc W/fl W/rad  Result Date: 04/01/2018 CLINICAL DATA:  Achalasia with megaesophagus. NG tube placement requested. EXAM: NASO G TUBE PLACEMENT WITH FL AND WITH RAD CONTRAST:  10 cc Isovue 300 per tube. FLUOROSCOPY TIME:  Fluoroscopy Time:  2 minutes 48 seconds Number of Acquired Spot Images: 0 COMPARISON:  03/31/2018 CT chest. FINDINGS: Left nasal route nasoenteric tube was advanced to the level of the esophagogastric junction. The tube could not be traversed into the stomach. The tip was left at the esophagogastric junction. IMPRESSION: Left nasal route nasoenteric tube advanced to the level of the esophagogastric junction, unable to traverse into the stomach. Enteric tube tip location confirmed at the esophagogastric junction. These results were called by telephone at the time of interpretation on 04/01/2018 at 11:28 am to Dr. Rolm Bookbinder , who verbally acknowledged these results. Electronically Signed   By: Ilona Sorrel M.D.   On: 04/01/2018 11:39    Anti-infectives: Anti-infectives (From admission, onward)   Start     Dose/Rate Route Frequency Ordered Stop   03/31/18 0115  piperacillin-tazobactam (ZOSYN) IVPB 3.375 g     3.375 g 12.5 mL/hr over 240 Minutes  Intravenous Every 8 hours 03/31/18 0106     03/30/18 1615  cefTRIAXone (ROCEPHIN) 2 g in sodium chloride 0.9 % 100 mL IVPB  Status:  Discontinued     2 g 200 mL/hr over 30 Minutes Intravenous Every 24 hours 03/30/18 1613 03/30/18 2321   03/30/18 1615  azithromycin (ZITHROMAX) 500 mg in sodium chloride 0.9 % 250 mL IVPB  Status:  Discontinued     500 mg 250 mL/hr over 60 Minutes Intravenous Every 24 hours 03/30/18 1613 03/30/18 2321   03/30/18 1615  metroNIDAZOLE (FLAGYL) IVPB 500 mg     500 mg 100 mL/hr over 60 Minutes Intravenous  Once 03/30/18 1613 03/30/18 1902       Assessment/Plan Contained perforated duodenal ulcer -abdominal exam is stable.  Will DC NGT  today and try clear liquids.  No evidence of contrast extrav or free air on both CT scans.   -WBC down some today to 23K.  follow - cont protonix and zosyn. -ambulate in halls -may shower  Achalasia  -appreciate TCTS evaluation.  Feel no acute intervention is warranted at this time due to its chronicity.  RML/RLL consolidation with emphysema -seems to be working a little more today.  Only sating 90-92% on 4L [place] today. -pulm following, appreciate their assistance with this patient. 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

## 2018-04-04 ENCOUNTER — Encounter (HOSPITAL_COMMUNITY): Payer: Self-pay

## 2018-04-04 ENCOUNTER — Inpatient Hospital Stay (HOSPITAL_COMMUNITY): Payer: Medicare Other

## 2018-04-04 DIAGNOSIS — R079 Chest pain, unspecified: Secondary | ICD-10-CM

## 2018-04-04 LAB — BASIC METABOLIC PANEL
ANION GAP: 8 (ref 5–15)
BUN: 6 mg/dL (ref 6–20)
CHLORIDE: 105 mmol/L (ref 101–111)
CO2: 23 mmol/L (ref 22–32)
Calcium: 7.8 mg/dL — ABNORMAL LOW (ref 8.9–10.3)
Creatinine, Ser: 0.79 mg/dL (ref 0.61–1.24)
Glucose, Bld: 127 mg/dL — ABNORMAL HIGH (ref 65–99)
POTASSIUM: 2.7 mmol/L — AB (ref 3.5–5.1)
SODIUM: 136 mmol/L (ref 135–145)

## 2018-04-04 LAB — GLUCOSE, CAPILLARY: GLUCOSE-CAPILLARY: 153 mg/dL — AB (ref 65–99)

## 2018-04-04 LAB — CBC
HCT: 32.4 % — ABNORMAL LOW (ref 39.0–52.0)
HEMOGLOBIN: 11.4 g/dL — AB (ref 13.0–17.0)
MCH: 29 pg (ref 26.0–34.0)
MCHC: 35.2 g/dL (ref 30.0–36.0)
MCV: 82.4 fL (ref 78.0–100.0)
PLATELETS: 325 10*3/uL (ref 150–400)
RBC: 3.93 MIL/uL — AB (ref 4.22–5.81)
RDW: 13.9 % (ref 11.5–15.5)
WBC: 19.8 10*3/uL — AB (ref 4.0–10.5)

## 2018-04-04 LAB — CULTURE, BLOOD (ROUTINE X 2)
CULTURE: NO GROWTH
Culture: NO GROWTH
Special Requests: ADEQUATE

## 2018-04-04 LAB — EXPECTORATED SPUTUM ASSESSMENT W GRAM STAIN, RFLX TO RESP C

## 2018-04-04 LAB — EXPECTORATED SPUTUM ASSESSMENT W REFEX TO RESP CULTURE

## 2018-04-04 LAB — BRAIN NATRIURETIC PEPTIDE: B NATRIURETIC PEPTIDE 5: 115.4 pg/mL — AB (ref 0.0–100.0)

## 2018-04-04 LAB — ECHOCARDIOGRAM COMPLETE
Height: 64 in
Weight: 2088.2 oz

## 2018-04-04 MED ORDER — METHYLPREDNISOLONE SODIUM SUCC 125 MG IJ SOLR
40.0000 mg | Freq: Four times a day (QID) | INTRAMUSCULAR | Status: AC
  Administered 2018-04-04 – 2018-04-05 (×3): 40 mg via INTRAVENOUS
  Filled 2018-04-04 (×3): qty 2

## 2018-04-04 MED ORDER — POTASSIUM CHLORIDE 10 MEQ/100ML IV SOLN
10.0000 meq | INTRAVENOUS | Status: AC
  Administered 2018-04-04 (×6): 10 meq via INTRAVENOUS
  Filled 2018-04-04 (×6): qty 100

## 2018-04-04 MED ORDER — FUROSEMIDE 10 MG/ML IJ SOLN
20.0000 mg | Freq: Four times a day (QID) | INTRAMUSCULAR | Status: AC
  Administered 2018-04-04 – 2018-04-05 (×3): 20 mg via INTRAVENOUS
  Filled 2018-04-04 (×3): qty 2

## 2018-04-04 MED ORDER — POTASSIUM CHLORIDE 10 MEQ/100ML IV SOLN
10.0000 meq | INTRAVENOUS | Status: AC
  Administered 2018-04-04 (×4): 10 meq via INTRAVENOUS
  Filled 2018-04-04 (×4): qty 100

## 2018-04-04 NOTE — Care Management Note (Addendum)
Case Management Note  Patient Details  Name: Raymond Jacobs MRN: [id number] Date of Birth: 15-Apr-1951  Subjective/Objective:  Pt admitted with perf duodenal ulcer                  Action/Plan:  Pt is independent from home.  Pt informed CM that he does not have PCP - interested in [place] PCP.  CM made appt with [facility] for 7/9 at 2:50pm.  CM informed pt of appt and the ability to use [facility] pharmacy at discharge.  CM will continue to follow for discharge needs   Expected Discharge Date:                  Expected Discharge Plan:     In-House Referral:  Clinical Social Work  Discharge planning Services  CM Consult  Post Acute Care Choice:    Choice offered to:     DME Arranged:    DME Agency:     HH Arranged:    [place] Agency:     Status of Service:     If discussed at [employer], dates discussed:    Additional Comments:  Maryclare Labrador, RN 04/04/2018, 4:45 PM

## 2018-04-04 NOTE — Progress Notes (Signed)
CRITICAL VALUE ALERT  Critical Value:  Potassium   Date & Time Notied: 04/04/18 04:26  Provider Notified: Dr. Oletta Darter.   Orders Received/Actions taken: Replace potassium

## 2018-04-04 NOTE — Progress Notes (Addendum)
Subjective/Chief Complaint: Reports less SOB, less abd pain, wants to eat more   Objective: Vital signs in last 24 hours: Temp:  [97.4 F (36.3 C)-98.6 F (37 C)] 98.6 F (37 C) (06/12 0745) Pulse Rate:  [86-116] 92 (06/12 0700) Resp:  [16-30] 22 (06/12 0700) BP: (125-164)/(68-90) 136/79 (06/12 0700) SpO2:  [90 %-100 %] 94 % (06/12 0815) FiO2 (%):  [100 %] 100 % (06/12 0815) Last BM Date: 03/30/18  Intake/Output from previous day: 06/11 0701 - 06/12 0700 In: 5129.2 [P.O.:600; I.V.:4232.1; IV Piggyback:297.1] Out: 1800 [Urine:1800] Intake/Output this shift: Total I/O In: -  Out: 200 [Urine:200]  General appearance: cooperative Resp: few wheezes, decreased R base Cardio: regular rate and rhythm GI: soft, minimal epig tenderness  Lab Results:  Recent Labs    04/03/18 0334 04/04/18 0317  WBC 24.7* 19.8*  HGB 12.5* 11.4*  HCT 36.8* 32.4*  PLT 360 325   BMET Recent Labs    04/03/18 0334 04/04/18 0317  NA 137 136  K 3.5 2.7*  CL 108 105  CO2 19* 23  GLUCOSE 110* 127*  BUN 10 6  CREATININE 0.87 0.79  CALCIUM 8.0* 7.8*   PT/INR No results for input(s): LABPROT, INR in the last 72 hours. ABG Recent Labs    04/03/18 1641  PHART 7.488*  HCO3 21.5    Studies/Results: Ct Angio Chest Pe W Or Wo Contrast  Result Date: 04/03/2018 CLINICAL DATA:  Shortness of breath, smoker EXAM: CT ANGIOGRAPHY CHEST WITH CONTRAST TECHNIQUE: Multidetector CT imaging of the chest was performed using the standard protocol during bolus administration of intravenous contrast. Multiplanar CT image reconstructions and MIPs were obtained to evaluate the vascular anatomy. CONTRAST:  158mL ISOVUE-370 IOPAMIDOL (ISOVUE-370) INJECTION 76% IV COMPARISON:  03/31/2018 FINDINGS: Cardiovascular: Aorta normal caliber with scattered atherosclerotic calcifications. Additional atherosclerotic calcifications at proximal great vessels and coronary arteries. No aortic aneurysm or dissection. No  pericardial effusion. Pulmonary arteries well opacified and patent. No evidence of pulmonary embolism. Mediastinum/Nodes: Massive dilatation of the esophagus by air in fluid with abrupt distal tapering favoring achalasia. No thoracic adenopathy. Base of cervical region normal appearance. Lungs/Pleura: Moderate-sized BILATERAL pleural effusions. Complete atelectasis of RIGHT middle and RIGHT lower lobes. Subtotal atelectasis of remaining lobes. Bullous emphysematous changes at lung apices greater on RIGHT. Scattered upper lobe infiltrates. No pneumothorax. Upper Abdomen: Ascites.  Otherwise unremarkable. Musculoskeletal: No acute osseous findings. Review of the MIP images confirms the above findings. IMPRESSION: No evidence pulmonary embolism. BILATERAL moderate pleural effusions with complete atelectasis of RIGHT middle and RIGHT lower lobes as well as significant atelectasis of LEFT lower lobe and to lesser degrees posterior upper lobes. Upper lobe infiltrates especially on LEFT consistent with pneumonia common new since previous exam. Underlying bullous emphysematous changes. Marked dilatation of the esophagus filled with air and fluid associated with abrupt distal tapering favoring achalasia. Aortic Atherosclerosis (ICD10-I70.0) and Emphysema (ICD10-J43.9). Electronically Signed   By: Lavonia Dana M.D.   On: 04/03/2018 21:15   Dg Chest Port 1 View  Result Date: 04/03/2018 CLINICAL DATA:  Shortness of breath. Atelectasis. Perforated duodenal ulcer. EXAM: PORTABLE CHEST 1 VIEW COMPARISON:  03/31/2018 FINDINGS: There has been placement of a nasogastric tube, which is looped within a large hiatal hernia with distal tip near the GE junction. Moderate bilateral pleural effusions and bibasilar atelectasis or consolidation shows no significant change. Heart size is stable. No pneumothorax visualized. IMPRESSION: New nasogastric tube is looped within a large hiatal hernia, with distal tip near the GE junction.  No  significant change in moderate bilateral pleural effusions and bibasilar atelectasis or consolidation. Electronically Signed   By: Earle Gell M.D.   On: 04/03/2018 08:57    Anti-infectives: Anti-infectives (From admission, onward)   Start     Dose/Rate Route Frequency Ordered Stop   03/31/18 0115  piperacillin-tazobactam (ZOSYN) IVPB 3.375 g     3.375 g 12.5 mL/hr over 240 Minutes Intravenous Every 8 hours 03/31/18 0106     03/30/18 1615  cefTRIAXone (ROCEPHIN) 2 g in sodium chloride 0.9 % 100 mL IVPB  Status:  Discontinued     2 g 200 mL/hr over 30 Minutes Intravenous Every 24 hours 03/30/18 1613 03/30/18 2321   03/30/18 1615  azithromycin (ZITHROMAX) 500 mg in sodium chloride 0.9 % 250 mL IVPB  Status:  Discontinued     500 mg 250 mL/hr over 60 Minutes Intravenous Every 24 hours 03/30/18 1613 03/30/18 2321   03/30/18 1615  metroNIDAZOLE (FLAGYL) IVPB 500 mg     500 mg 100 mL/hr over 60 Minutes Intravenous  Once 03/30/18 1613 03/30/18 1902      Assessment/Plan: Contained perforated duodenal ulcer -abdominal exam is better - advance to fulls -Zosyn d 5/5 - stop tomorrow unless needed for pulm  Achalasia  -appreciate TCTS evaluation.  Feel no acute intervention is warranted at this time due to its chronicity.  RML/RLL consolidation with emphysema Acute hypoxic resp failure -Appreciate Pulmonary management - resp CX  ETOH use -1 beer and 3 shots of vodka a day -no evidence of withdrawal  Hypokalemia - replete  Dispo - ICU for pulm issues  LOS: 5 days    Zenovia Jarred 04/04/2018

## 2018-04-04 NOTE — Progress Notes (Addendum)
PULMONARY / CRITICAL CARE MEDICINE     Name: Raymond Jacobs MRN: [id number] DOB: October 05, 1951    ADMISSION DATE:  03/30/2018 CONSULTATION DATE:  03/31/2018  REFERRING MD:  Georgiann Cocker  CHIEF COMPLAINT:  Abdominal pain  Interval Hx: I was called by the GSx PA since patient is requiring increased oxygen requirements. Patient is on non rebreather oxygen. He was confused earlier in the morning ? Received dilauded last night. He is awake alert now in mild distress. ABG showing severe hypoxia.   HISTORY OF PRESENT ILLNESS:        This is a 67 year old who was admitted on 6/7 for abdominal pain.  He was not having any nausea vomiting or change in bowel habits.  A CT scan of the abdomen showed a small amount of ascites and was suggestive of a duodenal  ulcer but showed no free air.  He was suspected of having a sealed duodenal perforation and was admitted for antibiotics and observation.  The same CT scan suggested a fluid collection in the chest.  Subsequently a CT of the chest was obtained which were hugely dilated esophagus and also suggested obstruction of the bronchus intermedius with collapse of the right middle and right lower lobes.  No mediastinal adenopathy is noted.  We were consulted for this finding.  The patient denies being unusually short of breath.  He denies anything other than his usual daily cough, he has had some chills since being admitted to the hospital.  He is thin but says that his weight is stable.  He is a 100-pack-year smoker and continues to smoke.  He says he was aware of some adenopathy in his groins and axilla but those went away when he took [place].   SUBJECTIVE:  On 100% HFNC, no events and no acute distress  VITAL SIGNS: BP 138/86   Pulse 98   Temp 98.7 F (37.1 C) (Oral)   Resp (!) 25   Ht 5\' 4"  (1.626 m)   Wt 130 lb 8.2 oz (59.2 kg)   SpO2 95%   BMI 22.40 kg/m   HEMODYNAMICS:    VENTILATOR SETTINGS: FiO2 (%):  [100 %] 100 %  INTAKE /  OUTPUT: I/O last 3 completed shifts: In: 5129.2 [P.O.:600; I.V.:4232.1; IV Piggyback:297.1] Out: 3025 [Urine:2875; Emesis/NG output:150]  PHYSICAL EXAMINATION: General: Pleasant elderly male, mild respiratory distress HEENT: [place]/AT, PERRL, EOM-I and MMM Pulmonary: Decreased BS diffusely, mild end exp wheezes Cardiac: RRR, Nl S1/S2 and -M/R/G Abdomen: Soft, NT, ND and +BS Extremities: Brisk cap refill no edema Neuro: Awake and oriented, moving all ext to command  LABS:  BMET Recent Labs  Lab 04/01/18 0332 04/03/18 0334 04/04/18 0317  NA 135 137 136  K 4.0 3.5 2.7*  CL 107 108 105  CO2 20* 19* 23  BUN 14 10 6   CREATININE 0.91 0.87 0.79  GLUCOSE 89 110* 127*    Electrolytes Recent Labs  Lab 04/01/18 0332 04/03/18 0334 04/04/18 0317  CALCIUM 7.9* 8.0* 7.8*  MG 1.9  --   --     CBC Recent Labs  Lab 04/02/18 0258 04/03/18 0334 04/04/18 0317  WBC 27.9* 24.7* 19.8*  HGB 13.8 12.5* 11.4*  HCT 41.2 36.8* 32.4*  PLT 343 360 325    Coag's Recent Labs  Lab 03/30/18 1720 03/31/18 0331  APTT  --  32  INR 1.07 1.39    Sepsis Markers Recent Labs  Lab 03/31/18 0152 03/31/18 0211 03/31/18 0331 04/01/18 0332 04/02/18 0258 04/02/18 1015  LATICACIDVEN 2.0* 2.08*  --   --   --  0.8  PROCALCITON  --   --  10.32 11.64 6.59  --     ABG Recent Labs  Lab 04/03/18 1641  PHART 7.488*  PCO2ART 28.7*  PO2ART 57.2*   On NRFM   Liver Enzymes Recent Labs  Lab 03/30/18 1308 03/31/18 0331 04/01/18 0332  AST 24 18 18   ALT 17 12* 13*  ALKPHOS 59 37* 45  BILITOT 1.7* 1.1 0.9  ALBUMIN 4.0 2.9* 2.3*    Cardiac Enzymes No results for input(s): TROPONINI, PROBNP in the last 168 hours.  Glucose Recent Labs  Lab 04/04/18 1113  GLUCAP 153*    Imaging Ct Angio Chest Pe W Or Wo Contrast  Result Date: 04/03/2018 CLINICAL DATA:  Shortness of breath, smoker EXAM: CT ANGIOGRAPHY CHEST WITH CONTRAST TECHNIQUE: Multidetector CT imaging of the chest was performed  using the standard protocol during bolus administration of intravenous contrast. Multiplanar CT image reconstructions and MIPs were obtained to evaluate the vascular anatomy. CONTRAST:  119mL ISOVUE-370 IOPAMIDOL (ISOVUE-370) INJECTION 76% IV COMPARISON:  03/31/2018 FINDINGS: Cardiovascular: Aorta normal caliber with scattered atherosclerotic calcifications. Additional atherosclerotic calcifications at proximal great vessels and coronary arteries. No aortic aneurysm or dissection. No pericardial effusion. Pulmonary arteries well opacified and patent. No evidence of pulmonary embolism. Mediastinum/Nodes: Massive dilatation of the esophagus by air in fluid with abrupt distal tapering favoring achalasia. No thoracic adenopathy. Base of cervical region normal appearance. Lungs/Pleura: Moderate-sized BILATERAL pleural effusions. Complete atelectasis of RIGHT middle and RIGHT lower lobes. Subtotal atelectasis of remaining lobes. Bullous emphysematous changes at lung apices greater on RIGHT. Scattered upper lobe infiltrates. No pneumothorax. Upper Abdomen: Ascites.  Otherwise unremarkable. Musculoskeletal: No acute osseous findings. Review of the MIP images confirms the above findings. IMPRESSION: No evidence pulmonary embolism. BILATERAL moderate pleural effusions with complete atelectasis of RIGHT middle and RIGHT lower lobes as well as significant atelectasis of LEFT lower lobe and to lesser degrees posterior upper lobes. Upper lobe infiltrates especially on LEFT consistent with pneumonia common new since previous exam. Underlying bullous emphysematous changes. Marked dilatation of the esophagus filled with air and fluid associated with abrupt distal tapering favoring achalasia. Aortic Atherosclerosis (ICD10-I70.0) and Emphysema (ICD10-J43.9). Electronically Signed   By: Lavonia Dana M.D.   On: 04/03/2018 21:15     STUDIES:  CT scan is noted with a hugely dilated esophagus.  There is compression of the bronchus  intermedius and some collapse of the right middle and right lower lobe.  CULTURES:   ANTIBIOTICS: Flagyl and Zosyn  I reviewed chest CT myself, diffuse emphysema, pleural effusions bilaterally and atelectasis  DISCUSSION:      This is a 67 year old admitted for abdominal pain who likely has a sealed perforation of the duodenum.  Incidentally radiological studies have shown a is usually dilated esophagus and compression of the bronchus intermedius with collapse of the right middle and right lower lobes.  Discussed with PCCM-NP.  ASSESSMENT / PLAN:  Emphysema:   - Solumedrol as ordered  - Duonebs  - Will need PFTs when more stable  Pleural effusion:  - Diureses, lasix 20 mg IV q6 x3 doses  Hypokalemia:  - Potassium IV to insure absorbance  - BMET in AM  Pulmonary edema:  - Lasix as above  - Strict I/O  - 2D echo  - KVO IVF  - Check BNP  Dilated esophagus:  - SLP  - ?need for NPO  - ?need for EGD, will defer  to surgery  Atelectasis:  - IS per RT protocol  - Flutter valve  - No bronchoscopy as will need intubation if to be bronched  Acute respiratory failure with hypoxemia: ARDS from aspiration  - Titrate O2 for sat of 88-92%  Hold in the ICU given how tenuous his ARDS  The patient is critically ill with multiple organ systems failure and requires high complexity decision making for assessment and support, frequent evaluation and titration of therapies, application of advanced monitoring technologies and extensive interpretation of multiple databases.   Critical Care Time devoted to patient care services described in this note is  32  Minutes. This time reflects time of care of this signee Dr Jennet Maduro. This critical care time does not reflect procedure time, or teaching time or supervisory time of PA/NP/Med student/Med Resident etc but could involve care discussion time.  Rush Farmer, M.D. [facility] Pulmonary/Critical Care Medicine. Pager: [phone]. After  hours pager: [phone].  04/04/2018, 11:23 AM

## 2018-04-04 NOTE — Progress Notes (Signed)
[facility] Progress Note Patient Name: Robel Wuertz DOB: 04-08-1951 MRN: [id number]   Date of Service  04/04/2018  HPI/Events of Note  K+ = 2.7 and Creatinine = 0.79.  eICU Interventions  Will  Replace K+.     Intervention Category Major Interventions: Electrolyte abnormality - evaluation and management  Woodrow Dulski Eugene 04/04/2018, 4:23 AM

## 2018-04-04 NOTE — Progress Notes (Signed)
  Echocardiogram 2D Echocardiogram has been performed.  Raymond Jacobs Raymond Jacobs 04/04/2018, 4:11 PM

## 2018-04-05 ENCOUNTER — Inpatient Hospital Stay (HOSPITAL_COMMUNITY): Payer: Medicare Other

## 2018-04-05 LAB — BASIC METABOLIC PANEL
ANION GAP: 14 (ref 5–15)
BUN: 9 mg/dL (ref 6–20)
CALCIUM: 8.5 mg/dL — AB (ref 8.9–10.3)
CO2: 29 mmol/L (ref 22–32)
Chloride: 90 mmol/L — ABNORMAL LOW (ref 101–111)
Creatinine, Ser: 0.8 mg/dL (ref 0.61–1.24)
GFR calc Af Amer: >60 mL/min (ref >60)
GFR calc non Af Amer: >60 mL/min (ref >60)
GLUCOSE: 206 mg/dL — AB (ref 65–99)
POTASSIUM: 3.3 mmol/L — AB (ref 3.5–5.1)
Sodium: 133 mmol/L — ABNORMAL LOW (ref 135–145)

## 2018-04-05 LAB — BLOOD GAS, ARTERIAL
Acid-Base Excess: 4.9 mmol/L — ABNORMAL HIGH (ref 0.0–2.0)
Bicarbonate: 27.7 mmol/L (ref 20.0–28.0)
DRAWN BY: 441371
FIO2: 80
O2 Content: 30 L/min
O2 SAT: 87.5 %
PCO2 ART: 32 mmHg (ref 32.0–48.0)
PO2 ART: 49.6 mmHg — AB (ref 83.0–108.0)
Patient temperature: 97.6
pH, Arterial: 7.544 — ABNORMAL HIGH (ref 7.350–7.450)

## 2018-04-05 LAB — CBC
HEMATOCRIT: 37.3 % — AB (ref 39.0–52.0)
Hemoglobin: 13 g/dL (ref 13.0–17.0)
MCH: 28.6 pg (ref 26.0–34.0)
MCHC: 34.9 g/dL (ref 30.0–36.0)
MCV: 82 fL (ref 78.0–100.0)
Platelets: 385 10*3/uL (ref 150–400)
RBC: 4.55 MIL/uL (ref 4.22–5.81)
RDW: 13.7 % (ref 11.5–15.5)
WBC: 17.3 10*3/uL — AB (ref 4.0–10.5)

## 2018-04-05 LAB — PHOSPHORUS: Phosphorus: 3.6 mg/dL (ref 2.5–4.6)

## 2018-04-05 LAB — MAGNESIUM: Magnesium: 1.7 mg/dL (ref 1.7–2.4)

## 2018-04-05 MED ORDER — IPRATROPIUM-ALBUTEROL 0.5-2.5 (3) MG/3ML IN SOLN
3.0000 mL | Freq: Four times a day (QID) | RESPIRATORY_TRACT | Status: DC
  Administered 2018-04-05 – 2018-04-07 (×8): 3 mL via RESPIRATORY_TRACT
  Filled 2018-04-05 (×9): qty 3

## 2018-04-05 MED ORDER — ENSURE ENLIVE PO LIQD
237.0000 mL | Freq: Two times a day (BID) | ORAL | Status: DC
  Administered 2018-04-05 – 2018-04-13 (×12): 237 mL via ORAL

## 2018-04-05 MED ORDER — POTASSIUM CHLORIDE CRYS ER 20 MEQ PO TBCR
40.0000 meq | EXTENDED_RELEASE_TABLET | Freq: Once | ORAL | Status: AC
  Administered 2018-04-05: 40 meq via ORAL
  Filled 2018-04-05: qty 2

## 2018-04-05 MED ORDER — FUROSEMIDE 10 MG/ML IJ SOLN
40.0000 mg | Freq: Once | INTRAMUSCULAR | Status: AC
  Administered 2018-04-05: 40 mg via INTRAVENOUS
  Filled 2018-04-05: qty 4

## 2018-04-05 NOTE — Progress Notes (Addendum)
Nutrition Follow-up  DOCUMENTATION CODES:   Severe malnutrition in context of chronic illness  INTERVENTION:    Chocolate Ensure Enlive po TID, each supplement provides 350 kcal and 20 grams of protein  Magic cup TID with meals, each supplement provides 290 kcal and 9 grams of protein  NUTRITION DIAGNOSIS:   Severe Malnutrition related to chronic illness(etoh abuse, GI ulcers) as evidenced by severe fat depletion, severe muscle depletion.  Ongoing  GOAL:   Patient will meet greater than or equal to 90% of their needs  Unmet  MONITOR:   Diet advancement, Labs, Weight trends, Skin, I & O's  ASSESSMENT:   67 y/o male with h/o etoh abuse, peptic ulcers, chronic NSAID use who was admitted on 6/7 for abdominal pain. CT scan of the abdomen showed a small amount of ascites and was suggestive of a duodenal ulcer but showed no free air.  He was suspected of having a sealed duodenal perforation and was admitted for antibiotics and observation.  The same CT scan suggested a fluid collection in the chest. Subsequently a CT of the chest was obtained which showed a hugely dilated esophagus and also suggested obstruction of the bronchus intermedius with collapse of the right middle and right lower lobes.  Discussed patient with RN today. Patient with worsening respiratory failure, transferred to the ICU on 6/11. Diet has been advanced to full liquids. Patient reports poor intake of meals, but intake is improving. No abdominal pain today. He has been drinking Boost Breeze supplements between meals, but doesn't complete the whole supplement. He likes chocolate Ensure and magic cups. RD to change supplement orders.  Labs and medications reviewed.  Sodium 133 (L), potassium 3.3 (L)  Diet Order:   Diet Order           Diet full liquid Room service appropriate? Yes; Fluid consistency: Thin  Diet effective now          EDUCATION NEEDS:   Education needs have been addressed  Skin:  Skin  Assessment: Reviewed RN Assessment  Last BM:  6/13 (type 7)  Height:   Ht Readings from Last 1 Encounters:  03/31/18 5\' 4"  (1.626 m)    Weight:   Wt Readings from Last 1 Encounters:  03/31/18 130 lb 8.2 oz (59.2 kg)    Ideal Body Weight:  59 kg  BMI:  Body mass index is 22.4 kg/m.  Estimated Nutritional Needs:   Kcal:  1600-1900kcal/day   Protein:  88-100g/day   Fluid:  >1.5L/day     Molli Barrows, RD, LDN, [place] Pager [phone] After Hours Pager [phone]

## 2018-04-05 NOTE — Progress Notes (Signed)
eLink Physician-Brief Progress Note Patient Name: Raymond Jacobs DOB: Jul 14, 1951 MRN: [id number]   Date of Service  04/05/2018  HPI/Events of Note    eICU Interventions  Hypokalemia -repleted      Intervention Category Minor Interventions: Electrolytes abnormality - evaluation and management  Reyaansh Merlo V. Konnor Vondrasek 04/05/2018, 6:03 AM

## 2018-04-05 NOTE — Progress Notes (Signed)
[facility]/Trauma Progress Note      Assessment/Plan Contained perforated duodenal ulcer -abdominal exam is better - continue fulls -Zosyn d 5/5 - stopped   Achalasia  -appreciate TCTS evaluation.  Feel no acute intervention is warranted at this time due to its chronicity.  RML/RLL consolidation with emphysema Acute hypoxic resp failure -Appreciate Pulmonary management - resp CX pending  ETOH use -1 beer and 3 shots of vodka a day -no evidence of withdrawal  Hypokalemia - replete   FEN: FLD VTE: SCD's, lovenox ID: Zosyn 06/08-06/13   WBC trending down 17.3 on 06/13 Foley: none Follow up: TBD  DISPO:  ICU for pulm issues, continue FLD    LOS: 6 days    Subjective: CC: dyspnea  His breathing is worse this am. Pt states no abdominal pain at this time. No nausea, vomiting or regurgitation with the FLD. He states he did not get up to walk yesterday. He hopes to today. He is having BM's and flatus.   Objective: Vital signs in last 24 hours: Temp:  [97.6 F (36.4 C)-99.7 F (37.6 C)] 97.6 F (36.4 C) (06/13 0810) Pulse Rate:  [89-111] 107 (06/13 0600) Resp:  [15-27] 18 (06/13 0600) BP: (112-147)/(67-92) 129/73 (06/13 0600) SpO2:  [82 %-99 %] 89 % (06/13 0806) FiO2 (%):  [60 %-80 %] 80 % (06/13 0806) Last BM Date: 04/04/18  Intake/Output from previous day: 06/12 0701 - 06/13 0700 In: 926.3 [I.V.:327.9; IV Piggyback:598.3] Out: 4465 [Urine:4465] Intake/Output this shift: No intake/output data recorded.  PE: Gen:  Alert, NAD, pleasant, cooperative Card:  RRR, no M/G/R heard Pulm:  Diminished breath sounds R base, no W/R/R, rate and effort increased  Abd: Soft, NT/ND, +BS Skin: no rashes noted, warm and dry   Anti-infectives: Anti-infectives (From admission, onward)   Start     Dose/Rate Route Frequency Ordered Stop   03/31/18 0115  piperacillin-tazobactam (ZOSYN) IVPB 3.375 g     3.375 g 12.5 mL/hr over 240 Minutes Intravenous Every  8 hours 03/31/18 0106 04/05/18 0559   03/30/18 1615  cefTRIAXone (ROCEPHIN) 2 g in sodium chloride 0.9 % 100 mL IVPB  Status:  Discontinued     2 g 200 mL/hr over 30 Minutes Intravenous Every 24 hours 03/30/18 1613 03/30/18 2321   03/30/18 1615  azithromycin (ZITHROMAX) 500 mg in sodium chloride 0.9 % 250 mL IVPB  Status:  Discontinued     500 mg 250 mL/hr over 60 Minutes Intravenous Every 24 hours 03/30/18 1613 03/30/18 2321   03/30/18 1615  metroNIDAZOLE (FLAGYL) IVPB 500 mg     500 mg 100 mL/hr over 60 Minutes Intravenous  Once 03/30/18 1613 03/30/18 1902      Lab Results:  Recent Labs    04/04/18 0317 04/05/18 0358  WBC 19.8* 17.3*  HGB 11.4* 13.0  HCT 32.4* 37.3*  PLT 325 385   BMET Recent Labs    04/04/18 0317 04/05/18 0358  NA 136 133*  K 2.7* 3.3*  CL 105 90*  CO2 23 29  GLUCOSE 127* 206*  BUN 6 9  CREATININE 0.79 0.80  CALCIUM 7.8* 8.5*   PT/INR No results for input(s): LABPROT, INR in the last 72 hours. CMP     Component Value Date/Time   NA 133 (L) 04/05/2018 0358   K 3.3 (L) 04/05/2018 0358   CL 90 (L) 04/05/2018 0358   CO2 29 04/05/2018 0358   GLUCOSE 206 (H) 04/05/2018 0358   BUN 9 04/05/2018 0358   CREATININE 0.80  04/05/2018 0358   CALCIUM 8.5 (L) 04/05/2018 0358   PROT 5.5 (L) 04/01/2018 0332   ALBUMIN 2.3 (L) 04/01/2018 0332   AST 18 04/01/2018 0332   ALT 13 (L) 04/01/2018 0332   ALKPHOS 45 04/01/2018 0332   BILITOT 0.9 04/01/2018 0332   GFRNONAA >60 04/05/2018 0358   GFRAA >60 04/05/2018 0358   Lipase     Component Value Date/Time   LIPASE 35 03/30/2018 1308    Studies/Results: Ct Angio Chest Pe W Or Wo Contrast  Result Date: 04/03/2018 CLINICAL DATA:  Shortness of breath, smoker EXAM: CT ANGIOGRAPHY CHEST WITH CONTRAST TECHNIQUE: Multidetector CT imaging of the chest was performed using the standard protocol during bolus administration of intravenous contrast. Multiplanar CT image reconstructions and MIPs were obtained to evaluate  the vascular anatomy. CONTRAST:  132mL ISOVUE-370 IOPAMIDOL (ISOVUE-370) INJECTION 76% IV COMPARISON:  03/31/2018 FINDINGS: Cardiovascular: Aorta normal caliber with scattered atherosclerotic calcifications. Additional atherosclerotic calcifications at proximal great vessels and coronary arteries. No aortic aneurysm or dissection. No pericardial effusion. Pulmonary arteries well opacified and patent. No evidence of pulmonary embolism. Mediastinum/Nodes: Massive dilatation of the esophagus by air in fluid with abrupt distal tapering favoring achalasia. No thoracic adenopathy. Base of cervical region normal appearance. Lungs/Pleura: Moderate-sized BILATERAL pleural effusions. Complete atelectasis of RIGHT middle and RIGHT lower lobes. Subtotal atelectasis of remaining lobes. Bullous emphysematous changes at lung apices greater on RIGHT. Scattered upper lobe infiltrates. No pneumothorax. Upper Abdomen: Ascites.  Otherwise unremarkable. Musculoskeletal: No acute osseous findings. Review of the MIP images confirms the above findings. IMPRESSION: No evidence pulmonary embolism. BILATERAL moderate pleural effusions with complete atelectasis of RIGHT middle and RIGHT lower lobes as well as significant atelectasis of LEFT lower lobe and to lesser degrees posterior upper lobes. Upper lobe infiltrates especially on LEFT consistent with pneumonia common new since previous exam. Underlying bullous emphysematous changes. Marked dilatation of the esophagus filled with air and fluid associated with abrupt distal tapering favoring achalasia. Aortic Atherosclerosis (ICD10-I70.0) and Emphysema (ICD10-J43.9). Electronically Signed   By: Lavonia Dana M.D.   On: 04/03/2018 21:15   Dg Chest Port 1 View  Result Date: 04/05/2018 CLINICAL DATA:  Respiratory failure EXAM: PORTABLE CHEST 1 VIEW COMPARISON:  04/03/2018 FINDINGS: NG tube has been removed. Small bilateral pleural effusions. Bilateral mid and lower lung airspace opacities, not  significantly changed. Heart appears normal size. Dilated air and fluid-filled esophagus again noted. IMPRESSION: Moderate bilateral effusions with bilateral perihilar and lower lobe airspace opacities which could represent atelectasis, pneumonia or edema. No real change. Electronically Signed   By: Rolm Baptise M.D.   On: 04/05/2018 07:15      Kalman Drape , [facility] 04/05/2018, 8:18 AM  Pager: [phone] Mon-Wed, Friday 7:00am-4:30pm Thurs 7am-11:30am  Consults: [phone]

## 2018-04-05 NOTE — Progress Notes (Addendum)
PULMONARY / CRITICAL CARE MEDICINE     Name: Raymond Jacobs MRN: [id number] DOB: 01/07/1951    ADMISSION DATE:  03/30/2018 CONSULTATION DATE:  03/31/2018  REFERRING MD:  Georgiann Cocker  CHIEF COMPLAINT:  Abdominal pain   HISTORY OF PRESENT ILLNESS:        This is a 67 year old who was admitted on 6/7 for abdominal pain.  He was not having any nausea vomiting or change in bowel habits.  A CT scan of the abdomen showed a small amount of ascites and was suggestive of a duodenal  ulcer but showed no free air.  He was suspected of having a sealed duodenal perforation and was admitted for antibiotics and observation.  The same CT scan suggested a fluid collection in the chest.  Subsequently a CT of the chest was obtained which were hugely dilated esophagus and also suggested obstruction of the bronchus intermedius with collapse of the right middle and right lower lobes.  No mediastinal adenopathy is noted.  We were consulted for this finding.  The patient denies being unusually short of breath.  He denies anything other than his usual daily cough, he has had some chills since being admitted to the hospital.  He is thin but says that his weight is stable.  He is a 100-pack-year smoker and continues to smoke.  He says he was aware of some adenopathy in his groins and axilla but those went away when he took [place].   SUBJECTIVE:  On 80% HFNC Denies Shortness of breath or pain, no wheezing Coughing up clear sputum Afebrile  VITAL SIGNS: BP 129/73   Pulse (!) 107   Temp 97.6 F (36.4 C) (Axillary)   Resp 18   Ht 5\' 4"  (1.626 m)   Wt 130 lb 8.2 oz (59.2 kg)   SpO2 (!) 89%   BMI 22.40 kg/m   HEMODYNAMICS:    VENTILATOR SETTINGS: FiO2 (%):  [60 %-80 %] 80 %  INTAKE / OUTPUT: I/O last 3 completed shifts: In: 2029.6 [I.V.:1334.2; IV Piggyback:695.4] Out: 6948 [Urine:5265]  PHYSICAL EXAMINATION: General: Pleasant elderly male, laying in bed, on 80% HFNC, in no acute distress HEENT:  Atraumatic, normocephalic, MM moist / pink, No focal deficits Pulmonary: Decreased breath sounds right base, No wheezing, tachypnea, slight assessory muscle use Cardiac: RRR, s1s2 noted, No M/R/G Abdomen: soft, non-tender, non-distended, BS+ x4Soft, NT, ND and +BS Extremities: No deformities, normal bulk and tone, no edema Neuro: Awake, alert and oriented, pleasant, cooperative, No focal deficits  LABS:  BMET Recent Labs  Lab 04/03/18 0334 04/04/18 0317 04/05/18 0358  NA 137 136 133*  K 3.5 2.7* 3.3*  CL 108 105 90*  CO2 19* 23 29  BUN 10 6 9   CREATININE 0.87 0.79 0.80  GLUCOSE 110* 127* 206*    Electrolytes Recent Labs  Lab 04/01/18 0332 04/03/18 0334 04/04/18 0317 04/05/18 0358  CALCIUM 7.9* 8.0* 7.8* 8.5*  MG 1.9  --   --  1.7  PHOS  --   --   --  3.6    CBC Recent Labs  Lab 04/03/18 0334 04/04/18 0317 04/05/18 0358  WBC 24.7* 19.8* 17.3*  HGB 12.5* 11.4* 13.0  HCT 36.8* 32.4* 37.3*  PLT 360 325 385    Coag's Recent Labs  Lab 03/30/18 1720 03/31/18 0331  APTT  --  32  INR 1.07 1.39    Sepsis Markers Recent Labs  Lab 03/31/18 0152 03/31/18 0211 03/31/18 0331 04/01/18 0332 04/02/18 0258 04/02/18 1015  LATICACIDVEN 2.0* 2.08*  --   --   --  0.8  PROCALCITON  --   --  10.32 11.64 6.59  --     ABG Recent Labs  Lab 04/03/18 1641  PHART 7.488*  PCO2ART 28.7*  PO2ART 57.2*   On NRFM   Liver Enzymes Recent Labs  Lab 03/30/18 1308 03/31/18 0331 04/01/18 0332  AST 24 18 18   ALT 17 12* 13*  ALKPHOS 59 37* 45  BILITOT 1.7* 1.1 0.9  ALBUMIN 4.0 2.9* 2.3*    Cardiac Enzymes No results for input(s): TROPONINI, PROBNP in the last 168 hours.  Glucose Recent Labs  Lab 04/04/18 1113  GLUCAP 153*    Imaging Dg Chest Port 1 View  Result Date: 04/05/2018 CLINICAL DATA:  Respiratory failure EXAM: PORTABLE CHEST 1 VIEW COMPARISON:  04/03/2018 FINDINGS: NG tube has been removed. Small bilateral pleural effusions. Bilateral mid and  lower lung airspace opacities, not significantly changed. Heart appears normal size. Dilated air and fluid-filled esophagus again noted. IMPRESSION: Moderate bilateral effusions with bilateral perihilar and lower lobe airspace opacities which could represent atelectasis, pneumonia or edema. No real change. Electronically Signed   By: Rolm Baptise M.D.   On: 04/05/2018 07:15     STUDIES:  CT scan is noted with a hugely dilated esophagus.  There is compression of the bronchus intermedius and some collapse of the right middle and right lower lobe.  CULTURES: Sputum 6/12>> Blood culture x2 6/7>>negative Urine 6/7>> Insignificant growth  ANTIBIOTICS: Flagyl 6/7 >>6/7 Zosyn 6/8>>6/13   DISCUSSION:      This is a 67 year old admitted for abdominal pain who likely has a sealed perforation of the duodenum.  Incidentally radiological studies have shown a is usually dilated esophagus and compression of the bronchus intermedius with collapse of the right middle and right lower lobes. Pt with bilateral pleural effusions and severe emphysema.     ASSESSMENT / PLAN:  A: Acute Hypoxic Respiratory failure in setting of Emphysema, bilateral pleural effusions, pulmonary edema and Atelectasis -BMP 115, Echo 6/12: EF 17-51%, grade 1 diastolic dysfunction  P:  -Supplemental O2 to maintain O2 sats 88 to 92%, wean as tolerated -Received Solu-medrol 40 mg q6h (ended 6/13 at 0100);will hold off on further steroids at this time  As pt has no wheezing -Continue Duonebs TID -Needs PFT's once more stable, as he has never been officially diagnosed with COPD -Received Lasix 20 mkg IV q6 x3 doses with urine output approx. 4.5 L on 6/12>>will give 40 mg Lasix  x1 dose today 6/13 -Obtain Bedside ultrasound to assess bilateral effusions and need for possible Thoracentesis -f/u CXR in am 6/14 -Monitor I&O / urine output -Incentive spirometry q1h while awake -Flutter valve -Hold off on Bronchoscopy as pt would need  intubation in order to be performed   Pt to remain in ICU given high FiO2 requirements.  Bedside Ultrasound performed by Dr. Halford Chessman to assess bilateral pleural effusions and need for Thoracentesis.  Ultrasound results not necessitating thoracentesis at this time,  will diuresis again today with follow up CXR in am 6/14.    04/05/2018, 10:51 AM

## 2018-04-06 ENCOUNTER — Inpatient Hospital Stay (HOSPITAL_COMMUNITY): Payer: Medicare Other

## 2018-04-06 LAB — BASIC METABOLIC PANEL
Anion gap: 10 (ref 5–15)
BUN: 14 mg/dL (ref 6–20)
CHLORIDE: 93 mmol/L — AB (ref 101–111)
CO2: 30 mmol/L (ref 22–32)
Calcium: 8.4 mg/dL — ABNORMAL LOW (ref 8.9–10.3)
Creatinine, Ser: 0.68 mg/dL (ref 0.61–1.24)
GFR calc non Af Amer: >60 mL/min (ref >60)
Glucose, Bld: 137 mg/dL — ABNORMAL HIGH (ref 65–99)
Potassium: 2.3 mmol/L — CL (ref 3.5–5.1)
SODIUM: 133 mmol/L — AB (ref 135–145)

## 2018-04-06 MED ORDER — FUROSEMIDE 10 MG/ML IJ SOLN
40.0000 mg | Freq: Once | INTRAMUSCULAR | Status: AC
  Administered 2018-04-06: 40 mg via INTRAVENOUS
  Filled 2018-04-06: qty 4

## 2018-04-06 MED ORDER — POTASSIUM CHLORIDE CRYS ER 20 MEQ PO TBCR
40.0000 meq | EXTENDED_RELEASE_TABLET | Freq: Two times a day (BID) | ORAL | Status: AC
  Administered 2018-04-06 (×2): 40 meq via ORAL
  Filled 2018-04-06 (×2): qty 2

## 2018-04-06 NOTE — Progress Notes (Signed)
CRITICAL VALUE ALERT  Critical Value:  K 2.3   Date & Time Notied:  04/06/18  0900  Provider Notified: Darel Hong NP   Orders Received/Actions taken: orders written in [facility]

## 2018-04-06 NOTE — Progress Notes (Signed)
PULMONARY / CRITICAL CARE MEDICINE     Name: Raymond Jacobs MRN: [id number] DOB: 07/27/1951    ADMISSION DATE:  03/30/2018 CONSULTATION DATE:  03/31/2018  REFERRING MD:  Georgiann Cocker  CHIEF COMPLAINT:  Abdominal pain   HISTORY OF PRESENT ILLNESS:        This is a 67 year old who was admitted on 6/7 for abdominal pain.  He was not having any nausea vomiting or change in bowel habits.  A CT scan of the abdomen showed a small amount of ascites and was suggestive of a duodenal  ulcer but showed no free air.  He was suspected of having a sealed duodenal perforation and was admitted for antibiotics and observation.  The same CT scan suggested a fluid collection in the chest.  Subsequently a CT of the chest was obtained which were hugely dilated esophagus and also suggested obstruction of the bronchus intermedius with collapse of the right middle and right lower lobes.  No mediastinal adenopathy is noted.  We were consulted for this finding.  The patient denies being unusually short of breath.  He denies anything other than his usual daily cough, he has had some chills since being admitted to the hospital.  He is thin but says that his weight is stable.  He is a 100-pack-year smoker and continues to smoke.  He says he was aware of some adenopathy in his groins and axilla but those went away when he took [place].   SUBJECTIVE:  On 80% HFNC Afebrile Denies Shortness of breath or wheezing Intermittent cough productive of clear white sputum Dyspnea upon exertion  VITAL SIGNS: BP 136/76   Pulse 100   Temp 98.7 F (37.1 C) (Oral)   Resp 19   Ht 5\' 4"  (1.626 m)   Wt 130 lb 8.2 oz (59.2 kg)   SpO2 96%   BMI 22.40 kg/m   HEMODYNAMICS:    VENTILATOR SETTINGS: FiO2 (%):  [80 %-90 %] 80 %  INTAKE / OUTPUT: I/O last 3 completed shifts: In: -  Out: 3700 [Urine:3700]  PHYSICAL EXAMINATION: General: Pleasant male, sitting up in chair, on 80% HFNC, in no acute distress HEENT: Atraumatic,  normocephalic, MM pink/moist, No focal deficits  Pulmonary: Fine crackles in bilateral bases, otherwise clear throughout, No wheezing, slight tachypnea, slight assessory muscle use  Cardiac: Tachycardia, RRR, s1s2 noted, no M/R/G  Abdomen: Soft, non-tender, non-distended, BS+ x4 soft Extremities: No deformities, normal bulk and tone, no edema, active ROM all extremities Neuro: Awake, alert and oriented x4, pleasant, cooperative, no focal deficits  LABS:  BMET Recent Labs  Lab 04/04/18 0317 04/05/18 0358 04/06/18 0759  NA 136 133* 133*  K 2.7* 3.3* 2.3*  CL 105 90* 93*  CO2 23 29 30   BUN 6 9 14   CREATININE 0.79 0.80 0.68  GLUCOSE 127* 206* 137*    Electrolytes Recent Labs  Lab 04/01/18 0332  04/04/18 0317 04/05/18 0358 04/06/18 0759  CALCIUM 7.9*   < > 7.8* 8.5* 8.4*  MG 1.9  --   --  1.7  --   PHOS  --   --   --  3.6  --    < > = values in this interval not displayed.    CBC Recent Labs  Lab 04/03/18 0334 04/04/18 0317 04/05/18 0358  WBC 24.7* 19.8* 17.3*  HGB 12.5* 11.4* 13.0  HCT 36.8* 32.4* 37.3*  PLT 360 325 385    Coag's Recent Labs  Lab 03/30/18 1720 03/31/18 0331  APTT  --  32  INR 1.07 1.39    Sepsis Markers Recent Labs  Lab 03/31/18 0152 03/31/18 0211 03/31/18 0331 04/01/18 0332 04/02/18 0258 04/02/18 1015  LATICACIDVEN 2.0* 2.08*  --   --   --  0.8  PROCALCITON  --   --  10.32 11.64 6.59  --     ABG Recent Labs  Lab 04/03/18 1641 04/05/18 1042  PHART 7.488* 7.544*  PCO2ART 28.7* 32.0  PO2ART 57.2* 49.6*   On NRFM   Liver Enzymes Recent Labs  Lab 03/30/18 1308 03/31/18 0331 04/01/18 0332  AST 24 18 18   ALT 17 12* 13*  ALKPHOS 59 37* 45  BILITOT 1.7* 1.1 0.9  ALBUMIN 4.0 2.9* 2.3*    Cardiac Enzymes No results for input(s): TROPONINI, PROBNP in the last 168 hours.  Glucose Recent Labs  Lab 04/04/18 1113  GLUCAP 153*    Imaging Dg Chest Port 1 View  Result Date: 04/06/2018 CLINICAL DATA:  Acute  respiratory failure EXAM: PORTABLE CHEST 1 VIEW COMPARISON:  04/05/2018 FINDINGS: Cardiac shadow is stable. Patchy bilateral infiltrates are noted with small effusions. The overall appearance given some patient positioning differences is stable. No new focal infiltrate is seen. No bony abnormality is noted. IMPRESSION: Stable bilateral effusions and bibasilar infiltrates. Electronically Signed   By: Inez Catalina M.D.   On: 04/06/2018 06:36     STUDIES:  CT scan is noted with a hugely dilated esophagus.  There is compression of the bronchus intermedius and some collapse of the right middle and right lower lobe.  CULTURES: Sputum 6/12>>abudant WBC, few yeast Blood culture x2 6/7>>negative Urine 6/7>> Insignificant growth  ANTIBIOTICS: Flagyl 6/7 >>6/7 Zosyn 6/8>>6/13   DISCUSSION:      This is a 67 year old admitted for abdominal pain who likely has a sealed perforation of the duodenum.  Incidentally radiological studies have shown a is usually dilated esophagus and compression of the bronchus intermedius with collapse of the right middle and right lower lobes. Pt with bilateral pleural effusions and severe emphysema.     ASSESSMENT / PLAN:  A: Acute Hypoxic Respiratory failure in setting of Emphysema, bilateral pleural effusions, pulmonary edema and Atelectasis -BMP 115, Echo 6/12: EF 70-35%, grade 1 diastolic dysfunction -Bedside US on 6/13 revealed small bilateral pleural effusions not requiring Thoracentesis -Sputum culture with WBC's and few yeast, likely normal flora  P: -Supplemental O2 to maintain O2 sats 88 to 92%, wean as tolerated -Received Solu-Medrol 40 mg q6h (ended 6/13), will hold off on further steroids as this time as pt has    no wheezing -Continue Duonebs q6h -Will give Lasix 40 mg IV x1 dose again today 6/14 (diuresed 1.7L on 6/13) -F/u CXR in am 6/15 -Monitor I&O's / urine output -Follow BMP with diuresis -Incentive spirometry q1h while awake -Flutter  valve -Hold off on Bronchoscopy as pt would require intubation in order to be performed   -Needs PFT's once more stable   Pt to remain in ICU given high FiO2 requirements.  Will diuresis again today 6/14 with follow up CXR in am 6/15.    04/06/2018, 10:59 AM

## 2018-04-06 NOTE — Progress Notes (Signed)
Patient ID: Raymond Jacobs, male   DOB: April 23, 1951, 67 y.o.   MRN: [id number]       Subjective: Pt looks great today.  Up in chair.  Tolerating fulls.  No abdominal pain.  Feels like his breathing is better.   Objective: Vital signs in last 24 hours: Temp:  [98 F (36.7 C)-98.7 F (37.1 C)] 98.7 F (37.1 C) (06/14 0734) Pulse Rate:  [88-119] 93 (06/14 0500) Resp:  [13-24] 13 (06/14 0500) BP: (102-146)/(56-85) 118/75 (06/14 0500) SpO2:  [81 %-98 %] 95 % (06/14 0813) FiO2 (%):  [80 %-90 %] 80 % (06/14 0813) Last BM Date: 04/05/18  Intake/Output from previous day: 06/13 0701 - 06/14 0700 In: -  Out: 1700 [Urine:1700] Intake/Output this shift: Total I/O In: -  Out: 425 [Urine:425]  PE: Heart: regular, slightly tachy Lungs: clear, moving air much better today.  Sating 93-98% on HFNC at 30L and Fi02 of 80% Abd: soft, NT, ND, +BS  Lab Results:  Recent Labs    04/04/18 0317 04/05/18 0358  WBC 19.8* 17.3*  HGB 11.4* 13.0  HCT 32.4* 37.3*  PLT 325 385   BMET Recent Labs    04/05/18 0358 04/06/18 0759  NA 133* 133*  K 3.3* 2.3*  CL 90* 93*  CO2 29 30  GLUCOSE 206* 137*  BUN 9 14  CREATININE 0.80 0.68  CALCIUM 8.5* 8.4*   PT/INR No results for input(s): LABPROT, INR in the last 72 hours. CMP     Component Value Date/Time   NA 133 (L) 04/06/2018 0759   K 2.3 (LL) 04/06/2018 0759   CL 93 (L) 04/06/2018 0759   CO2 30 04/06/2018 0759   GLUCOSE 137 (H) 04/06/2018 0759   BUN 14 04/06/2018 0759   CREATININE 0.68 04/06/2018 0759   CALCIUM 8.4 (L) 04/06/2018 0759   PROT 5.5 (L) 04/01/2018 0332   ALBUMIN 2.3 (L) 04/01/2018 0332   AST 18 04/01/2018 0332   ALT 13 (L) 04/01/2018 0332   ALKPHOS 45 04/01/2018 0332   BILITOT 0.9 04/01/2018 0332   GFRNONAA >60 04/06/2018 0759   GFRAA >60 04/06/2018 0759   Lipase     Component Value Date/Time   LIPASE 35 03/30/2018 1308       Studies/Results: Dg Chest Port 1 View  Result Date: 04/06/2018 CLINICAL DATA:   Acute respiratory failure EXAM: PORTABLE CHEST 1 VIEW COMPARISON:  04/05/2018 FINDINGS: Cardiac shadow is stable. Patchy bilateral infiltrates are noted with small effusions. The overall appearance given some patient positioning differences is stable. No new focal infiltrate is seen. No bony abnormality is noted. IMPRESSION: Stable bilateral effusions and bibasilar infiltrates. Electronically Signed   By: Inez Catalina M.D.   On: 04/06/2018 06:36   Dg Chest Port 1 View  Result Date: 04/05/2018 CLINICAL DATA:  Respiratory failure EXAM: PORTABLE CHEST 1 VIEW COMPARISON:  04/03/2018 FINDINGS: NG tube has been removed. Small bilateral pleural effusions. Bilateral mid and lower lung airspace opacities, not significantly changed. Heart appears normal size. Dilated air and fluid-filled esophagus again noted. IMPRESSION: Moderate bilateral effusions with bilateral perihilar and lower lobe airspace opacities which could represent atelectasis, pneumonia or edema. No real change. Electronically Signed   By: Rolm Baptise M.D.   On: 04/05/2018 07:15    Anti-infectives: Anti-infectives (From admission, onward)   Start     Dose/Rate Route Frequency Ordered Stop   03/31/18 0115  piperacillin-tazobactam (ZOSYN) IVPB 3.375 g     3.375 g 12.5 mL/hr over 240 Minutes Intravenous Every  8 hours 03/31/18 0106 04/05/18 0559   03/30/18 1615  cefTRIAXone (ROCEPHIN) 2 g in sodium chloride 0.9 % 100 mL IVPB  Status:  Discontinued     2 g 200 mL/hr over 30 Minutes Intravenous Every 24 hours 03/30/18 1613 03/30/18 2321   03/30/18 1615  azithromycin (ZITHROMAX) 500 mg in sodium chloride 0.9 % 250 mL IVPB  Status:  Discontinued     500 mg 250 mL/hr over 60 Minutes Intravenous Every 24 hours 03/30/18 1613 03/30/18 2321   03/30/18 1615  metroNIDAZOLE (FLAGYL) IVPB 500 mg     500 mg 100 mL/hr over 60 Minutes Intravenous  Once 03/30/18 1613 03/30/18 1902       Assessment/Plan Contained perforated duodenal ulcer -adv to soft  diet today -Zosyn d 5/5- stopped  -WBC trending down.  AF  Achalasia  -appreciate TCTS evaluation. Feel no acute intervention is warranted at this time due to its chronicity.  RML/RLL consolidation with emphysema Acute hypoxic resp failure -Appreciate Pulmonary management - resp CX shows few yeast.  Will defer to CCM if this requires treatment. 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  4mEq of KDur BID -will check a mag and phos in am.  They were normal yesterday.     FEN: soft VTE: SCD's, lovenox ID: Zosyn 06/08-06/13   WBC trending down 17.3 on 06/13 Foley: none Follow up: TBD  DISPO:  ICU for pulm issues   LOS: 7 days    Raymond Jacobs , [facility] 04/06/2018, 9:56 AM Pager: [phone]

## 2018-04-07 ENCOUNTER — Inpatient Hospital Stay (HOSPITAL_COMMUNITY): Payer: Medicare Other

## 2018-04-07 LAB — CULTURE, RESPIRATORY

## 2018-04-07 LAB — CBC
HCT: 39.6 % (ref 39.0–52.0)
Hemoglobin: 13.3 g/dL (ref 13.0–17.0)
MCH: 28.3 pg (ref 26.0–34.0)
MCHC: 33.6 g/dL (ref 30.0–36.0)
MCV: 84.3 fL (ref 78.0–100.0)
PLATELETS: 515 10*3/uL — AB (ref 150–400)
RBC: 4.7 MIL/uL (ref 4.22–5.81)
RDW: 14.1 % (ref 11.5–15.5)
WBC: 18.4 10*3/uL — AB (ref 4.0–10.5)

## 2018-04-07 LAB — BASIC METABOLIC PANEL
ANION GAP: 11 (ref 5–15)
ANION GAP: 10 (ref 5–15)
BUN: 14 mg/dL (ref 6–20)
BUN: 17 mg/dL (ref 6–20)
CO2: 28 mmol/L (ref 22–32)
CO2: 26 mmol/L (ref 22–32)
Calcium: 8.4 mg/dL — ABNORMAL LOW (ref 8.9–10.3)
Calcium: 8.5 mg/dL — ABNORMAL LOW (ref 8.9–10.3)
Chloride: 94 mmol/L — ABNORMAL LOW (ref 101–111)
Chloride: 96 mmol/L — ABNORMAL LOW (ref 101–111)
Creatinine, Ser: 0.66 mg/dL (ref 0.61–1.24)
Creatinine, Ser: 0.72 mg/dL (ref 0.61–1.24)
GFR calc Af Amer: >60 mL/min (ref >60)
GLUCOSE: 123 mg/dL — AB (ref 65–99)
Glucose, Bld: 99 mg/dL (ref 65–99)
POTASSIUM: 2.9 mmol/L — AB (ref 3.5–5.1)
POTASSIUM: 4.9 mmol/L (ref 3.5–5.1)
SODIUM: 133 mmol/L — AB (ref 135–145)
SODIUM: 132 mmol/L — AB (ref 135–145)

## 2018-04-07 LAB — CULTURE, RESPIRATORY W GRAM STAIN

## 2018-04-07 LAB — PHOSPHORUS: PHOSPHORUS: 3.4 mg/dL (ref 2.5–4.6)

## 2018-04-07 LAB — MAGNESIUM: MAGNESIUM: 1.8 mg/dL (ref 1.7–2.4)

## 2018-04-07 MED ORDER — POTASSIUM CHLORIDE CRYS ER 20 MEQ PO TBCR
40.0000 meq | EXTENDED_RELEASE_TABLET | ORAL | Status: AC
  Administered 2018-04-07 (×2): 40 meq via ORAL
  Filled 2018-04-07 (×2): qty 2

## 2018-04-07 MED ORDER — MAGNESIUM SULFATE 2 GM/50ML IV SOLN
2.0000 g | Freq: Once | INTRAVENOUS | Status: AC
  Administered 2018-04-07: 2 g via INTRAVENOUS
  Filled 2018-04-07: qty 50

## 2018-04-07 NOTE — Progress Notes (Signed)
PULMONARY / CRITICAL CARE MEDICINE     Name: Raymond Jacobs MRN: [id number] DOB: 1951/02/21    ADMISSION DATE:  03/30/2018 CONSULTATION DATE:  03/31/2018  REFERRING MD:  Georgiann Cocker  CHIEF COMPLAINT:  Abdominal pain   HISTORY OF PRESENT ILLNESS:  67 year old who was admitted on 6/7 for abdominal pain.  He was not having any nausea vomiting or change in bowel habits.  A CT scan of the abdomen showed a small amount of ascites and was suggestive of a duodenal  ulcer but showed no free air.  He was suspected of having a sealed duodenal perforation and was admitted for antibiotics and observation.  The same CT scan suggested a fluid collection in the chest.  Subsequently a CT of the chest was obtained which were hugely dilated esophagus and also suggested obstruction of the bronchus intermedius with collapse of the right middle and right lower lobes.  No mediastinal adenopathy is noted.  We were consulted for this finding.  The patient denies being unusually short of breath.  He denies anything other than his usual daily cough, he has had some chills since being admitted to the hospital.  He is thin but says that his weight is stable.  He is a 100-pack-year smoker and continues to smoke.  He says he was aware of some adenopathy in his groins and axilla but those went away when he took [place].   SUBJECTIVE:  Net neg 1.8L in last 24 hours.  O2 weaned to 20L / 30% FiO2.     VITAL SIGNS: BP 114/68   Pulse (!) 102   Temp 98.8 F (37.1 C) (Oral)   Resp (!) 27   Ht 5\' 4"  (1.626 m)   Wt 130 lb 8.2 oz (59.2 kg)   SpO2 91%   BMI 22.40 kg/m   HEMODYNAMICS:    VENTILATOR SETTINGS: FiO2 (%):  [40 %-70 %] 40 %  INTAKE / OUTPUT: I/O last 3 completed shifts: In: -  Out: 1850 [Urine:1850]  PHYSICAL EXAMINATION: General: cachectic male in NAD, sitting up at bedside HEENT: MM pink/moist Neuro: AAOx4, speech clear, MAE  CV: s1s2 rrr, no m/r/g PULM: even/non-labored, lungs bilaterally  diminished but clear  ZT:IWPY, non-tender, bsx4 active  Extremities: warm/dry, no edema  Skin: no rashes or lesions  LABS:  BMET Recent Labs  Lab 04/05/18 0358 04/06/18 0759 04/07/18 0351  NA 133* 133* 133*  K 3.3* 2.3* 2.9*  CL 90* 93* 94*  CO2 29 30 28   BUN 9 14 14   CREATININE 0.80 0.68 0.66  GLUCOSE 206* 137* 99    Electrolytes Recent Labs  Lab 04/01/18 0332  04/05/18 0358 04/06/18 0759 04/07/18 0351  CALCIUM 7.9*   < > 8.5* 8.4* 8.4*  MG 1.9  --  1.7  --  1.8  PHOS  --   --  3.6  --  3.4   < > = values in this interval not displayed.    CBC Recent Labs  Lab 04/04/18 0317 04/05/18 0358 04/07/18 0351  WBC 19.8* 17.3* 18.4*  HGB 11.4* 13.0 13.3  HCT 32.4* 37.3* 39.6  PLT 325 385 515*    Coag's No results for input(s): APTT, INR in the last 168 hours.  Sepsis Markers Recent Labs  Lab 04/01/18 0332 04/02/18 0258 04/02/18 1015  LATICACIDVEN  --   --  0.8  PROCALCITON 11.64 6.59  --     ABG Recent Labs  Lab 04/03/18 1641 04/05/18 1042  PHART 7.488* 7.544*  PCO2ART 28.7* 32.0  PO2ART 57.2* 49.6*   On NRFM   Liver Enzymes Recent Labs  Lab 04/01/18 0332  AST 18  ALT 13*  ALKPHOS 45  BILITOT 0.9  ALBUMIN 2.3*    Cardiac Enzymes No results for input(s): TROPONINI, PROBNP in the last 168 hours.  Glucose Recent Labs  Lab 04/04/18 1113  GLUCAP 153*    Imaging Dg Chest Port 1 View  Result Date: 04/07/2018 CLINICAL DATA:  Respiratory failure. EXAM: PORTABLE CHEST 1 VIEW COMPARISON:  04/06/2018 and CT chest 04/03/2018. FINDINGS: Trachea is midline. Heart size stable. Thoracic aorta is calcified. Mild bibasilar airspace opacification persists with improvement in aeration from yesterday's exam. Small bilateral pleural effusions persist. IMPRESSION: 1. Improving bibasilar airspace opacification, which may be due to atelectasis and/or pneumonia. 2. Small bilateral pleural effusions, grossly stable. Electronically Signed   By: Lorin Picket M.D.   On: 04/07/2018 07:21    STUDIES:  CT ABD 6/8 >> large fluid filled structure in the right lower chest representing massively dilated esophagus, airway obstruction involving the bronchus intermedius CTA Chest 6/11 >> neg for PE, bilateral pleural effusions, upper lobe infiltrates, marked dilation of the esophagus filled with air/fluid associated with abrupt tapering favoring achalasia   CULTURES: Sputum 6/12 >> abudant WBC, few yeast Blood culture x2 6/7 >> negative Urine 6/7 >> Insignificant growth  ANTIBIOTICS: Flagyl 6/7 >> 6/7 Zosyn 6/8 >> 6/13  EVENTS: 6/07 Admit  6/14 80% HFNC 6/15 40% O2    DISCUSSION: 67 year old admitted for abdominal pain who likely has a sealed perforation of the duodenum.  Incidentally radiological studies have shown a is usually dilated esophagus and compression of the bronchus intermedius with collapse of the right middle and right lower lobes. Pt with bilateral pleural effusions and severe emphysema.     ASSESSMENT / PLAN:  A: Acute Hypoxic Respiratory failure - in setting of emphysema, bilateral pleural effusions, pulmonary edema and atelectasis -BMP 115, Echo 6/12: EF 99-35%, grade 1 diastolic dysfunction -Bedside US on 6/13 revealed small bilateral pleural effusions not requiring Thoracentesis -Sputum culture with WBC's and few yeast, likely normal flora  P: Wean O2 for sats > 90% Follow intermittent CXR  Continue duoneb Q6  Hold lasix 6/15, consider repeat 6/16  KCL, Mg 6/15  Monitor I/O  IS / Pulmonary hygiene  Will need PFT's once more stable / outpatient    Ok from pulmonary standpoint to transfer to SDU.   Noe Gens, NP-C [facility] Pgr: [phone] or if no answer [phone] 04/07/2018, 9:40 AM

## 2018-04-07 NOTE — Progress Notes (Signed)
[facility] ADULT ICU REPLACEMENT PROTOCOL FOR AM LAB REPLACEMENT ONLY  The patient does apply for the [facility] Adult ICU Electrolyte Replacment Protocol based on the criteria listed below:   1. Is GFR >/= 40 ml/min? Yes.    Patient's GFR today is >60 2. Is urine output >/= 0.5 ml/kg/hr for the last 6 hours? Yes.   Patient's UOP is 1.1 ml/kg/hr 3. Is BUN < 60 mg/dL? Yes.    Patient's BUN today is 14 4. Abnormal electrolyte(s): *K2.9,Mg1.8 5. Ordered repletion with:per protocol  6. If a panic level lab has been reported, has the CCM MD in charge been notified? Yes.  .   Physician:  Juleen Starr 04/07/2018 6:35 AM

## 2018-04-07 NOTE — Progress Notes (Signed)
[facility]/Trauma Progress Note      Assessment/Plan Contained perforated duodenal ulcer -adv to soft diet 06/14 -Zosyn d 5/5- stopped -WBC slightly up, monitor.  afebrile  Achalasia  -appreciate TCTS evaluation. Feel no acute intervention is warranted at this time due to its chronicity.  RML/RLL consolidation with emphysema Acute hypoxic resp failure - Appreciate Pulmonary management - resp CXshows few yeast.  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  21mEq of KDur BID, monitor, am labs   GYB:WLSL VTE: SCD's, lovenox HT:DSKAJ 06/08-06/13 WBC slightly up to 18.4 on 06/13 Foley:none Follow up:TBD  DISPO:ICU for pulm issues but improving, PO K replacement, monitor WBC    LOS: 8 days    Subjective:  Pt denies abdominal pain and he is tolerating his diet. He states he does not eat much at baseline. He believes it is better to be skinny than fat. He states his breathing is better. He is feeling better overall. Wishes he could walk around.   Objective: Vital signs in last 24 hours: Temp:  [98.1 F (36.7 C)-98.9 F (37.2 C)] 98.8 F (37.1 C) (06/15 0806) Pulse Rate:  [88-108] 102 (06/15 0700) Resp:  [15-27] 27 (06/15 0700) BP: (109-136)/(68-89) 114/68 (06/15 0700) SpO2:  [87 %-98 %] 91 % (06/15 0700) FiO2 (%):  [40 %-70 %] 40 % (06/15 0800) Last BM Date: 04/06/18  Intake/Output from previous day: 06/14 0701 - 06/15 0700 In: -  Out: 1850 [Urine:1850] Intake/Output this shift: No intake/output data recorded.  PE: Gen:  Alert, NAD, pleasant, cooperative Card:  RRR, no M/G/R heard Pulm:  Diminished breath sounds R base, no W/R/R,  rate and effort normal  Abd: Soft, NT/ND, +BS Skin: no rashes noted, warm and dry   Anti-infectives: Anti-infectives (From admission, onward)   Start     Dose/Rate Route Frequency Ordered Stop   03/31/18 0115  piperacillin-tazobactam (ZOSYN) IVPB 3.375 g     3.375 g 12.5 mL/hr over 240 Minutes Intravenous Every 8 hours 03/31/18 0106 04/05/18 0559   03/30/18 1615  cefTRIAXone (ROCEPHIN) 2 g in sodium chloride 0.9 % 100 mL IVPB  Status:  Discontinued     2 g 200 mL/hr over 30 Minutes Intravenous Every 24 hours 03/30/18 1613 03/30/18 2321   03/30/18 1615  azithromycin (ZITHROMAX) 500 mg in sodium chloride 0.9 % 250 mL IVPB  Status:  Discontinued     500 mg 250 mL/hr over 60 Minutes Intravenous Every 24 hours 03/30/18 1613 03/30/18 2321   03/30/18 1615  metroNIDAZOLE (FLAGYL) IVPB 500 mg     500 mg 100 mL/hr over 60 Minutes Intravenous  Once 03/30/18 1613 03/30/18 1902      Lab Results:  Recent Labs    04/05/18 0358 04/07/18 0351  WBC 17.3* 18.4*  HGB 13.0 13.3  HCT 37.3* 39.6  PLT 385 515*   BMET Recent Labs    04/06/18 0759 04/07/18 0351  NA 133* 133*  K 2.3* 2.9*  CL 93* 94*  CO2 30 28  GLUCOSE 137* 99  BUN 14 14  CREATININE 0.68 0.66  CALCIUM 8.4* 8.4*   PT/INR No results for input(s): LABPROT, INR in the last 72 hours. CMP  Component Value Date/Time   NA 133 (L) 04/07/2018 0351   K 2.9 (L) 04/07/2018 0351   CL 94 (L) 04/07/2018 0351   CO2 28 04/07/2018 0351   GLUCOSE 99 04/07/2018 0351   BUN 14 04/07/2018 0351   CREATININE 0.66 04/07/2018 0351   CALCIUM 8.4 (L) 04/07/2018 0351   PROT 5.5 (L) 04/01/2018 0332   ALBUMIN 2.3 (L) 04/01/2018 0332   AST 18 04/01/2018 0332   ALT 13 (L) 04/01/2018 0332   ALKPHOS 45 04/01/2018 0332   BILITOT 0.9 04/01/2018 0332   GFRNONAA >60 04/07/2018 0351   GFRAA >60 04/07/2018 0351   Lipase     Component Value Date/Time   LIPASE 35 03/30/2018 1308    Studies/Results: Dg Chest Port 1 View  Result Date:  04/07/2018 CLINICAL DATA:  Respiratory failure. EXAM: PORTABLE CHEST 1 VIEW COMPARISON:  04/06/2018 and CT chest 04/03/2018. FINDINGS: Trachea is midline. Heart size stable. Thoracic aorta is calcified. Mild bibasilar airspace opacification persists with improvement in aeration from yesterday's exam. Small bilateral pleural effusions persist. IMPRESSION: 1. Improving bibasilar airspace opacification, which may be due to atelectasis and/or pneumonia. 2. Small bilateral pleural effusions, grossly stable. Electronically Signed   By: Lorin Picket M.D.   On: 04/07/2018 07:21   Dg Chest Port 1 View  Result Date: 04/06/2018 CLINICAL DATA:  Acute respiratory failure EXAM: PORTABLE CHEST 1 VIEW COMPARISON:  04/05/2018 FINDINGS: Cardiac shadow is stable. Patchy bilateral infiltrates are noted with small effusions. The overall appearance given some patient positioning differences is stable. No new focal infiltrate is seen. No bony abnormality is noted. IMPRESSION: Stable bilateral effusions and bibasilar infiltrates. Electronically Signed   By: Inez Catalina M.D.   On: 04/06/2018 06:36      Kalman Drape , [facility] 04/07/2018, 8:33 AM  Pager: [phone] Mon-Wed, Friday 7:00am-4:30pm Thurs 7am-11:30am  Consults: [phone]

## 2018-04-08 ENCOUNTER — Inpatient Hospital Stay (HOSPITAL_COMMUNITY): Payer: Medicare Other

## 2018-04-08 ENCOUNTER — Encounter (HOSPITAL_COMMUNITY): Payer: Self-pay | Admitting: Radiology

## 2018-04-08 ENCOUNTER — Telehealth: Payer: Self-pay | Admitting: Pulmonary Disease

## 2018-04-08 DIAGNOSIS — I700 Atherosclerosis of aorta: Secondary | ICD-10-CM

## 2018-04-08 HISTORY — DX: Atherosclerosis of aorta: I70.0

## 2018-04-08 LAB — BASIC METABOLIC PANEL
Anion gap: 7 (ref 5–15)
BUN: 13 mg/dL (ref 6–20)
CO2: 25 mmol/L (ref 22–32)
Calcium: 8.4 mg/dL — ABNORMAL LOW (ref 8.9–10.3)
Chloride: 98 mmol/L — ABNORMAL LOW (ref 101–111)
Creatinine, Ser: 0.64 mg/dL (ref 0.61–1.24)
GFR calc non Af Amer: >60 mL/min (ref >60)
Glucose, Bld: 111 mg/dL — ABNORMAL HIGH (ref 65–99)
Potassium: 4.4 mmol/L (ref 3.5–5.1)
SODIUM: 130 mmol/L — AB (ref 135–145)

## 2018-04-08 LAB — CBC
HCT: 37.9 % — ABNORMAL LOW (ref 39.0–52.0)
Hemoglobin: 12.8 g/dL — ABNORMAL LOW (ref 13.0–17.0)
MCH: 28.2 pg (ref 26.0–34.0)
MCHC: 33.8 g/dL (ref 30.0–36.0)
MCV: 83.5 fL (ref 78.0–100.0)
PLATELETS: 523 10*3/uL — AB (ref 150–400)
RBC: 4.54 MIL/uL (ref 4.22–5.81)
RDW: 13.9 % (ref 11.5–15.5)
WBC: 23.7 10*3/uL — ABNORMAL HIGH (ref 4.0–10.5)

## 2018-04-08 MED ORDER — IPRATROPIUM-ALBUTEROL 0.5-2.5 (3) MG/3ML IN SOLN
3.0000 mL | Freq: Three times a day (TID) | RESPIRATORY_TRACT | Status: DC
  Administered 2018-04-08 – 2018-04-09 (×4): 3 mL via RESPIRATORY_TRACT
  Filled 2018-04-08 (×4): qty 3

## 2018-04-08 MED ORDER — IOHEXOL 300 MG/ML  SOLN
100.0000 mL | Freq: Once | INTRAMUSCULAR | Status: AC | PRN
  Administered 2018-04-08: 100 mL via INTRAVENOUS

## 2018-04-08 MED ORDER — FUROSEMIDE 10 MG/ML IJ SOLN
40.0000 mg | Freq: Every day | INTRAMUSCULAR | Status: AC
  Administered 2018-04-08 – 2018-04-09 (×2): 40 mg via INTRAVENOUS
  Filled 2018-04-08 (×2): qty 4

## 2018-04-08 MED ORDER — FUROSEMIDE 10 MG/ML IJ SOLN: 40.0000 mg | Freq: Two times a day (BID) | INTRAMUSCULAR | Status: DC

## 2018-04-08 MED ORDER — IOPAMIDOL (ISOVUE-300) INJECTION 61%: 15.0000 mL | INTRAVENOUS | Status: AC

## 2018-04-08 MED ORDER — SODIUM CHLORIDE 1 G PO TABS
1.0000 g | ORAL_TABLET | Freq: Two times a day (BID) | ORAL | Status: DC
  Administered 2018-04-08 – 2018-04-13 (×11): 1 g via ORAL
  Filled 2018-04-08 (×13): qty 1

## 2018-04-08 MED ORDER — POTASSIUM CHLORIDE CRYS ER 20 MEQ PO TBCR
20.0000 meq | EXTENDED_RELEASE_TABLET | Freq: Every day | ORAL | Status: AC
  Administered 2018-04-08 – 2018-04-09 (×2): 20 meq via ORAL
  Filled 2018-04-08 (×3): qty 1

## 2018-04-08 NOTE — Progress Notes (Signed)
[facility]/Trauma Progress Note      Assessment/Plan Contained perforated duodenal ulcer -adv to soft diet 06/14 -Zosyn d 5/5- stopped -WBC remains elevated, monitor. afebrile  Achalasia  -appreciate TCTS evaluation. Feel no acute intervention is warranted at this time due to its chronicity.  RML/RLL consolidation with emphysema Acute hypoxic resp failure - Appreciate Pulmonary management - resp CXshows few yeast. 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 7mEq of KDur BID, monitor, am labs  Hyponatremia - 130 today, salt tabs BID, monitor   OZD:GUYQ VTE: SCD's, lovenox IH:KVQQV 06/08-06/13 WBC slightly up to 18.4 on 06/13 Foley:none Follow up:TBD  DISPO:move to floor,  monitor WBC, hopefully home soon    LOS: 9 days    Subjective: CC: abdominal muscle soreness  Soreness of abdomen with movement. No abdominal pain at rest. Tolerating diet. No nausea or vomiting. Breathing is greatly improved.  Objective: Vital signs in last 24 hours: Temp:  [97.9 F (36.6 C)-98.8 F (37.1 C)] 98.8 F (37.1 C) (06/16 0803) Pulse Rate:  [86-112] 100 (06/16 0800) Resp:  [12-25] 18 (06/16 0800) BP: (98-132)/(53-89) 128/53 (06/16 0800) SpO2:  [93 %-98 %] 95 % (06/16 0800) FiO2 (%):  [25 %-30 %] 25 % (06/15 1551) Last BM Date: 04/06/18  Intake/Output from previous day: 06/15 0701 - 06/16 0700 In: 0  Out: 825 [Urine:825] Intake/Output this shift: No intake/output data recorded.  PE: Gen: Alert, NAD, pleasant, cooperative Card: RRR, no M/G/R heard Pulm:Diminished breath sounds b/l, no W/R/R,rate andeffortnormal,  [place] 4L Abd: Soft, NT/ND, +BS Skin: no rashes noted, warm and dry   Anti-infectives: Anti-infectives (From admission, onward)   Start     Dose/Rate Route Frequency Ordered Stop   03/31/18 0115  piperacillin-tazobactam (ZOSYN) IVPB 3.375 g     3.375 g 12.5 mL/hr over 240 Minutes Intravenous Every 8 hours 03/31/18 0106 04/05/18 0559   03/30/18 1615  cefTRIAXone (ROCEPHIN) 2 g in sodium chloride 0.9 % 100 mL IVPB  Status:  Discontinued     2 g 200 mL/hr over 30 Minutes Intravenous Every 24 hours 03/30/18 1613 03/30/18 2321   03/30/18 1615  azithromycin (ZITHROMAX) 500 mg in sodium chloride 0.9 % 250 mL IVPB  Status:  Discontinued     500 mg 250 mL/hr over 60 Minutes Intravenous Every 24 hours 03/30/18 1613 03/30/18 2321   03/30/18 1615  metroNIDAZOLE (FLAGYL) IVPB 500 mg     500 mg 100 mL/hr over 60 Minutes Intravenous  Once 03/30/18 1613 03/30/18 1902      Lab Results:  Recent Labs    04/07/18 0351 04/08/18 0423  WBC 18.4* 23.7*  HGB 13.3 12.8*  HCT 39.6 37.9*  PLT 515* 523*   BMET Recent Labs    04/07/18 1950 04/08/18 0423  NA 132* 130*  K 4.9 4.4  CL 96* 98*  CO2 26 25  GLUCOSE 123* 111*  BUN 17 13  CREATININE 0.72 0.64  CALCIUM 8.5* 8.4*   PT/INR No results for input(s): LABPROT, INR in the last 72 hours. CMP     Component Value Date/Time   NA 130 (L) 04/08/2018 0423   K 4.4 04/08/2018 0423  CL 98 (L) 04/08/2018 0423   CO2 25 04/08/2018 0423   GLUCOSE 111 (H) 04/08/2018 0423   BUN 13 04/08/2018 0423   CREATININE 0.64 04/08/2018 0423   CALCIUM 8.4 (L) 04/08/2018 0423   PROT 5.5 (L) 04/01/2018 0332   ALBUMIN 2.3 (L) 04/01/2018 0332   AST 18 04/01/2018 0332   ALT 13 (L) 04/01/2018 0332   ALKPHOS 45 04/01/2018 0332   BILITOT 0.9 04/01/2018 0332   GFRNONAA >60 04/08/2018 0423   GFRAA >60 04/08/2018 0423   Lipase     Component Value Date/Time   LIPASE 35 03/30/2018 1308    Studies/Results: Dg Chest Port 1 View  Result Date: 04/08/2018 CLINICAL  DATA:  Acute respiratory failure with hypoxia. EXAM: PORTABLE CHEST 1 VIEW COMPARISON:  1 day prior FINDINGS: Midline trachea. Normal heart size. Atherosclerosis in the transverse aorta. Small bilateral pleural effusions persist. No pneumothorax. Right greater than left base airspace disease is not significantly changed. Numerous leads and wires project over the chest. IMPRESSION: No significant change since one day prior. Bilateral pleural effusions with right greater than left base airspace disease. On the left, most likely atelectasis. At the right lung base, infection or aspiration could look similar. Electronically Signed   By: Abigail Miyamoto M.D.   On: 04/08/2018 07:19   Dg Chest Port 1 View  Result Date: 04/07/2018 CLINICAL DATA:  Respiratory failure. EXAM: PORTABLE CHEST 1 VIEW COMPARISON:  04/06/2018 and CT chest 04/03/2018. FINDINGS: Trachea is midline. Heart size stable. Thoracic aorta is calcified. Mild bibasilar airspace opacification persists with improvement in aeration from yesterday's exam. Small bilateral pleural effusions persist. IMPRESSION: 1. Improving bibasilar airspace opacification, which may be due to atelectasis and/or pneumonia. 2. Small bilateral pleural effusions, grossly stable. Electronically Signed   By: Lorin Picket M.D.   On: 04/07/2018 07:21      Kalman Drape , [facility] 04/08/2018, 8:43 AM  Pager: [phone] Mon-Wed, Friday 7:00am-4:30pm Thurs 7am-11:30am  Consults: [phone]

## 2018-04-08 NOTE — Telephone Encounter (Signed)
Please arrange for a hospital follow up for Mr. Raymond Jacobs.  He will need follow up PFT's for COPD evaluation.  Managed pleural effusions while inpatient.    Noe Gens, NP-C [facility] Pgr: [phone] or if no answer [phone] 04/08/2018, 9:05 AM

## 2018-04-08 NOTE — Progress Notes (Signed)
PULMONARY / CRITICAL CARE MEDICINE     Name: Raymond Jacobs MRN: [id number] DOB: [birth year]    ADMISSION DATE:  03/30/2018 CONSULTATION DATE:  03/31/2018  REFERRING MD:  Georgiann Cocker  CHIEF COMPLAINT:  Abdominal pain   HISTORY OF PRESENT ILLNESS:  67 year old who was admitted on 6/7 for abdominal pain.  He was not having any nausea vomiting or change in bowel habits.  A CT scan of the abdomen showed a small amount of ascites and was suggestive of a duodenal  ulcer but showed no free air.  He was suspected of having a sealed duodenal perforation and was admitted for antibiotics and observation.  The same CT scan suggested a fluid collection in the chest.  Subsequently a CT of the chest was obtained which were hugely dilated esophagus and also suggested obstruction of the bronchus intermedius with collapse of the right middle and right lower lobes.  No mediastinal adenopathy is noted.  We were consulted for this finding.  The patient denies being unusually short of breath.  He denies anything other than his usual daily cough, he has had some chills since being admitted to the hospital.  He is thin but says that his weight is stable.  He is a 100-pack-year smoker and continues to smoke.  He says he was aware of some adenopathy in his groins and axilla but those went away when he took [place].   SUBJECTIVE:  Afebrile / WBC 23.7.  I/O - net neg 1.3L since admit.  O2 decreased to 4L/[place].  Pt denies complaints.    VITAL SIGNS: BP (!) 128/53   Pulse 100   Temp 98.8 F (37.1 C) (Oral)   Resp 18   Ht 5\' 4"  (1.626 m)   Wt 130 lb 8.2 oz (59.2 kg)   SpO2 95%   BMI 22.40 kg/m   HEMODYNAMICS:    VENTILATOR SETTINGS: FiO2 (%):  [25 %-30 %] 25 %  INTAKE / OUTPUT: I/O last 3 completed shifts: In: 0  Out: 1225 [Urine:1225]  PHYSICAL EXAMINATION: General: thin adult male in NAD HEENT: MM pink/moist Neuro: AAOx4, speech clear, MAE CV: s1s2 rrr, no m/r/g PULM: even/non-labored, lungs  bilaterally mildly diminished lower JS:HFWY, non-tender, bsx4 active  Extremities: warm/dry, no edema  Skin: no rashes or lesions  LABS:  BMET Recent Labs  Lab 04/07/18 0351 04/07/18 1950 04/08/18 0423  NA 133* 132* 130*  K 2.9* 4.9 4.4  CL 94* 96* 98*  CO2 28 26 25   BUN 14 17 13   CREATININE 0.66 0.72 0.64  GLUCOSE 99 123* 111*    Electrolytes Recent Labs  Lab 04/05/18 0358  04/07/18 0351 04/07/18 1950 04/08/18 0423  CALCIUM 8.5*   < > 8.4* 8.5* 8.4*  MG 1.7  --  1.8  --   --   PHOS 3.6  --  3.4  --   --    < > = values in this interval not displayed.    CBC Recent Labs  Lab 04/05/18 0358 04/07/18 0351 04/08/18 0423  WBC 17.3* 18.4* 23.7*  HGB 13.0 13.3 12.8*  HCT 37.3* 39.6 37.9*  PLT 385 515* 523*    Coag's No results for input(s): APTT, INR in the last 168 hours.  Sepsis Markers Recent Labs  Lab 04/02/18 0258 04/02/18 1015  LATICACIDVEN  --  0.8  PROCALCITON 6.59  --     ABG Recent Labs  Lab 04/03/18 1641 04/05/18 1042  PHART 7.488* 7.544*  PCO2ART 28.7* 32.0  PO2ART  57.2* 49.6*   On NRFM   Liver Enzymes No results for input(s): AST, ALT, ALKPHOS, BILITOT, ALBUMIN in the last 168 hours.  Cardiac Enzymes No results for input(s): TROPONINI, PROBNP in the last 168 hours.  Glucose Recent Labs  Lab 04/04/18 1113  GLUCAP 153*    Imaging Dg Chest Port 1 View  Result Date: 04/08/2018 CLINICAL DATA:  Acute respiratory failure with hypoxia. EXAM: PORTABLE CHEST 1 VIEW COMPARISON:  1 day prior FINDINGS: Midline trachea. Normal heart size. Atherosclerosis in the transverse aorta. Small bilateral pleural effusions persist. No pneumothorax. Right greater than left base airspace disease is not significantly changed. Numerous leads and wires project over the chest. IMPRESSION: No significant change since one day prior. Bilateral pleural effusions with right greater than left base airspace disease. On the left, most likely atelectasis. At the  right lung base, infection or aspiration could look similar. Electronically Signed   By: Abigail Miyamoto M.D.   On: 04/08/2018 07:19    STUDIES:  CT ABD 6/8 >> large fluid filled structure in the right lower chest representing massively dilated esophagus, airway obstruction involving the bronchus intermedius CTA Chest 6/11 >> neg for PE, bilateral pleural effusions, upper lobe infiltrates, marked dilation of the esophagus filled with air/fluid associated with abrupt tapering favoring achalasia   CULTURES: Sputum 6/12 >> abudant WBC, few yeast Blood culture x2 6/7 >> negative Urine 6/7 >> Insignificant growth  ANTIBIOTICS: Flagyl 6/7 >> 6/7 Zosyn 6/8 >> 6/13  EVENTS: 6/07 Admit  6/14 80% HFNC 6/15 Net neg 1.8L in last 24 hours.  O2 weaned to 20L / 30% FiO2.   DISCUSSION: 67 year old admitted for abdominal pain who likely has a sealed perforation of the duodenum.  Incidentally radiological studies have shown a is usually dilated esophagus and compression of the bronchus intermedius with collapse of the right middle and right lower lobes. Pt with bilateral pleural effusions and severe emphysema.     ASSESSMENT / PLAN:  A: Acute Hypoxic Respiratory failure - in setting of emphysema, bilateral pleural effusions, pulmonary edema and atelectasis -BMP 115, Echo 6/12: EF 24-09%, grade 1 diastolic dysfunction -Bedside US on 6/13 revealed small bilateral pleural effusions not requiring Thoracentesis -Sputum culture with WBC's and few yeast, likely normal flora  P: Wean O2 to off for sats > 90% Follow CXR / pleural effusions Continue Duoneb TID > give PRN albuterol at discharge  Lasix 40 mg QD x2 days with KCL  Follow BMP with diuresis  Monitor I/O's  Pulmonary hygiene - IS, ambulate  Will need ambulatory O2 assessment prior to discharge  PT efforts  Follow up as outpatient with Pulmonary for PFT's (message sent to our office for set up)  Agree with transfer from ICU.  Defer to CCS.    Noe Gens, NP-C [facility] Pgr: [phone] or if no answer [phone] 04/08/2018, 8:52 AM

## 2018-04-09 ENCOUNTER — Inpatient Hospital Stay (HOSPITAL_COMMUNITY): Payer: Medicare Other

## 2018-04-09 MED ORDER — UMECLIDINIUM-VILANTEROL 62.5-25 MCG/INH IN AEPB
1.0000 | INHALATION_SPRAY | Freq: Every day | RESPIRATORY_TRACT | Status: DC
  Administered 2018-04-10 – 2018-04-13 (×4): 1 via RESPIRATORY_TRACT
  Filled 2018-04-09: qty 14

## 2018-04-09 MED ORDER — ALBUTEROL SULFATE (2.5 MG/3ML) 0.083% IN NEBU: 2.5000 mg | INHALATION_SOLUTION | RESPIRATORY_TRACT | Status: DC | PRN

## 2018-04-09 NOTE — Plan of Care (Signed)
  Problem: Clinical Measurements: Goal: Respiratory complications will improve Outcome: Progressing   Problem: Activity: Goal: Risk for activity intolerance will decrease Outcome: Progressing   Problem: Nutrition: Goal: Adequate nutrition will be maintained Outcome: Progressing   Problem: Pain Managment: Goal: General experience of comfort will improve Outcome: Progressing   

## 2018-04-09 NOTE — Progress Notes (Signed)
texted paged Dr. Robby Sermon concerning Heart rate from 110-130's. Will continue to monitor patient.

## 2018-04-09 NOTE — Telephone Encounter (Signed)
Patient is still admitted.  Will hold message in triage until pt is discharged to schedule.

## 2018-04-09 NOTE — Progress Notes (Signed)
Subjective/Chief Complaint: ulcer  Feels ok some epigastric pain     Objective: Vital signs in last 24 hours: Temp:  [97.8 F (36.6 C)-99.3 F (37.4 C)] 98.2 F (36.8 C) (06/17 0450) Pulse Rate:  [89-106] 89 (06/17 0450) Resp:  [16-17] 16 (06/17 0450) BP: (119-122)/(72-77) 122/72 (06/17 0450) SpO2:  [95 %-99 %] 95 % (06/17 0901) Last BM Date: 04/08/18  Intake/Output from previous day: 06/16 0701 - 06/17 0700 In: 420 [P.O.:420] Out: 2175 [Urine:2175] Intake/Output this shift: No intake/output data recorded.  General appearance: alert and cooperative GI: soft, non-tender; bowel sounds normal; no masses,  no organomegaly Extremities: extremities normal, atraumatic, no cyanosis or edema  Lab Results:  Recent Labs    04/07/18 0351 04/08/18 0423  WBC 18.4* 23.7*  HGB 13.3 12.8*  HCT 39.6 37.9*  PLT 515* 523*   BMET Recent Labs    04/07/18 1950 04/08/18 0423  NA 132* 130*  K 4.9 4.4  CL 96* 98*  CO2 26 25  GLUCOSE 123* 111*  BUN 17 13  CREATININE 0.72 0.64  CALCIUM 8.5* 8.4*   PT/INR No results for input(s): LABPROT, INR in the last 72 hours. ABG No results for input(s): PHART, HCO3 in the last 72 hours.  Invalid input(s): PCO2, PO2  Studies/Results: Dg Chest 2 View  Result Date: 04/09/2018 CLINICAL DATA:  Shortness of breath, right lower lobe pneumonia EXAM: CHEST - 2 VIEW COMPARISON:  Portable chest x-ray of April 08, 2018 FINDINGS: There is persistent volume loss on the right. There is a small right pleural effusion. Dense infiltrate is present in the lower lobe. On the left there is less prominent basilar density consistent with atelectasis or pneumonia and a small effusion. There is no mediastinal shift. There is no pneumothorax or pneumomediastinum. The heart and pulmonary vascularity are normal. There is calcification in the wall of the aortic arch. IMPRESSION: Bibasilar atelectasis or pneumonia with small bilateral pleural effusions greatest on the  right. When compared to yesterday's study there may have been slight interval improvement. Thoracic aortic atherosclerosis.  Are Electronically Signed   By: Dorsie  Martinique M.D.   On: 04/09/2018 07:29   Ct Abdomen Pelvis W Contrast  Result Date: 04/08/2018 CLINICAL DATA:  Follow-up duodenal ulcer. EXAM: CT ABDOMEN AND PELVIS WITH CONTRAST TECHNIQUE: Multidetector CT imaging of the abdomen and pelvis was performed using the standard protocol following bolus administration of intravenous contrast. CONTRAST:  179mL OMNIPAQUE IOHEXOL 300 MG/ML  SOLN COMPARISON:  CT abdomen 03/31/18 and CT abdomen pelvis 03/30/2018 FINDINGS: Lower chest: Small left pleural effusion is identified, similar to previous exam. Bilateral lower lobe atelectasis and consolidation is identified. Large hiatal hernia again noted. Moderate changes of emphysema identified. Aortic atherosclerosis noted. There are calcifications noted within the LAD and left circumflex coronary arteries. Hepatobiliary: There is no focal liver abnormality identified. Gallbladder is unremarkable. No biliary dilatation. Pancreas: Normal appearance of the pancreas. Spleen: No focal splenic abnormality. Adrenals/Urinary Tract: The adrenal glands are normal. No kidney mass or hydronephrosis identified bilaterally. The urinary bladder appears normal. Stomach/Bowel: No abnormal distension of the stomach. Previously identified suspected duodenal ulcer is again noted involving the bulb, image 27/6. This contains a small focus of gas. No abnormal dilatation of the small bowel loops. No abnormal wall thickening or inflammation. No pathologic dilatation of the colon. Vascular/Lymphatic: Aortic atherosclerosis. No aneurysm. The portal vein and hepatic veins appear patent. Of thank you back atrophy no abdominal or pelvic adenopathy. No inguinal adenopathy. Reproductive: Prostate gland enlargement.  Other: There is a large loculated fluid collection within the upper abdomen which  extends to cover both the liver and spleen. In contrast to the previous exam this now has a well defined enhancing border. Further there has been progressive mass effect upon the spleen with caudal displacement of the spleen with respect to the left hemidiaphragm. These may be amendable to percutaneous drainage under image guidance. A small fluid collection measuring 2 cm is identified adjacent to the gastric antrum, image 44/3. No significant ascites or fluid collections within the abdomen or pelvis. No free intraperitoneal air noted at this time. 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 IMPRESSION: 1. Interval loculation of previous upper abdominal fluid which now covers both the liver and spleen and exerts mass effect upon the spleen. This may be amendable to percutaneous drainage. 2. Small duodenal bulb ulcer is less conspicuous than on the previous exam. 3. Bilateral lower lobe airspace consolidation in atelectasis. 4. Small left pleural effusion 5. Large hiatal hernia 6. Aortic atherosclerosis and LAD/left circumflex coronary artery atherosclerotic calcifications. Aortic Atherosclerosis (ICD10-I70.0). Electronically Signed   By: Kerby Moors M.D.   On: 04/08/2018 14:40   Dg Chest Port 1 View  Result Date: 04/08/2018 CLINICAL DATA:  Acute respiratory failure with hypoxia. EXAM: PORTABLE CHEST 1 VIEW COMPARISON:  1 day prior FINDINGS: Midline trachea. Normal heart size. Atherosclerosis in the transverse aorta. Small bilateral pleural effusions persist. No pneumothorax. Right greater than left base airspace disease is not significantly changed. Numerous leads and wires project over the chest. IMPRESSION: No significant change since one day prior. Bilateral pleural effusions with right greater than left base airspace disease. On the left, most likely atelectasis. At the right lung base, infection or aspiration could look similar. Electronically Signed   By: Abigail Miyamoto  M.D.   On: 04/08/2018 07:19    Anti-infectives: Anti-infectives (From admission, onward)   Start     Dose/Rate Route Frequency Ordered Stop   03/31/18 0115  piperacillin-tazobactam (ZOSYN) IVPB 3.375 g     3.375 g 12.5 mL/hr over 240 Minutes Intravenous Every 8 hours 03/31/18 0106 04/05/18 0559   03/30/18 1615  cefTRIAXone (ROCEPHIN) 2 g in sodium chloride 0.9 % 100 mL IVPB  Status:  Discontinued     2 g 200 mL/hr over 30 Minutes Intravenous Every 24 hours 03/30/18 1613 03/30/18 2321   03/30/18 1615  azithromycin (ZITHROMAX) 500 mg in sodium chloride 0.9 % 250 mL IVPB  Status:  Discontinued     500 mg 250 mL/hr over 60 Minutes Intravenous Every 24 hours 03/30/18 1613 03/30/18 2321   03/30/18 1615  metroNIDAZOLE (FLAGYL) IVPB 500 mg     500 mg 100 mL/hr over 60 Minutes Intravenous  Once 03/30/18 1613 03/30/18 1902      Assessment/Plan: Contained perforated duodenal ulcer -adv to soft diet06/14 -Zosyn d 5/5- stopped -WBCremains elevated, monitor.afebrile  Achalasia  -appreciate TCTS evaluation. Feel no acute intervention is warranted at this time due to its chronicity.  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 50mEq of KDur BID, monitor, am labs  Hyponatremia -  130 today, salt tabs BID, monitor   PTE:LMRA VTE: SCD's, lovenox JH:HIDUP 06/08-06/13 WBC slightly up to 23.5CT shows abscess- ask IR to consider drain  Foley:none Follow up:TBD     LOS: 10 days    Joyice Faster Keshia Weare 04/09/2018

## 2018-04-09 NOTE — Progress Notes (Signed)
SATURATION QUALIFICATIONS: (This note is used to comply with regulatory documentation for home oxygen)  Patient Saturations on Room Air at Rest = 92%  Patient Saturations on Room Air while Ambulating = 90%   

## 2018-04-09 NOTE — Consult Note (Signed)
Chief Complaint: Patient was seen in consultation today for intra abdominal abscess drain placement Chief Complaint  Patient presents with  . Abdominal Pain   at the request of Dr Shann Medal   Supervising Physician: Marybelle Killings  Patient Status: [facility] - Out-pt  History of Present Illness: Raymond Jacobs is a 67 y.o. male   Contained perforated duodenal ulcer Leukocytosis; fever abd pain  CT yesterday 1. Interval loculation of previous upper abdominal fluid which now covers both the liver and spleen and exerts mass effect upon the spleen. This may be amendable to percutaneous drainage. 2. Small duodenal bulb ulcer is less conspicuous than on the previous exam.  Request for intra abdominal abscess drain placement Dr Barbie Banner has reviewed imaging and approves procedure Scheduled for 6/18 am   Past Medical History:  Diagnosis Date  . Stomach ulcer     History reviewed. No pertinent surgical history.  Allergies: Asa [aspirin] and Other  Medications: Prior to Admission medications   Medication Sig Start Date End Date Taking? Authorizing Provider  acetaminophen (TYLENOL) 500 MG tablet Take 500 mg by mouth every 6 (six) hours as needed (for pain).    Yes [provider]  B Complex-C-Folic Acid (SUPER B-COMPLEX/VIT C/FA) TABS Take 1 tablet by mouth 2 (two) times a week.   Yes [provider]  Cyanocobalamin (VITAMIN B 12 PO) Take 1 tablet by mouth 2 (two) times a week.    Yes [provider]  ECHINACEA PO Take 2 capsules by mouth 2 (two) times a week.   Yes [provider]  FAMOTIDINE PO Take 1 tablet by mouth See admin instructions. Take 1 tablet by mouth 5 times a week   Yes [provider]  ibuprofen (ADVIL,MOTRIN) 200 MG tablet Take 400 mg by mouth See admin instructions. Take 400 mg by mouth two to three times a day as needed for pain   Yes [provider]  Omega-3 Fatty Acids (FISH OIL PO) Take 1 capsule by mouth 2  (two) times a week.    Yes [provider]  VITAMIN E PO Take 1 capsule by mouth 2 (two) times a week.   Yes [provider]  clobetasol ointment (TEMOVATE) 0.05 % Apply topically 2 (two) times daily. Patient not taking: Reported on 03/30/2018 05/16/13   Janne Napoleon, NP  esomeprazole (NEXIUM) 40 MG packet Take 40 mg by mouth 2 (two) times daily. Patient not taking: Reported on 03/30/2018 08/21/12   Jerene Bears, MD  famotidine (PEPCID) 20 MG tablet Take 1 tablet (20 mg total) by mouth 2 (two) times daily. Patient not taking: Reported on 03/30/2018 08/15/12   Jerene Bears, MD  lanolin ointment Apply topically as needed for dry skin. Patient not taking: Reported on 03/30/2018 05/16/13   Janne Napoleon, NP     Family History  Problem Relation Age of Onset  . Colon cancer Brother   . Diabetes Brother   . Diabetes Maternal Aunt     Social History   Socioeconomic History  . Marital status: Divorced    Spouse name: Not on file  . Number of children: Not on file  . Years of education: Not on file  . Highest education level: Not on file  Occupational History  . Not on file  Social Needs  . Financial resource strain: Not on file  . Food insecurity:    Worry: Not on file    Inability: Not on file  . Transportation needs:  Medical: Not on file    Non-medical: Not on file  Tobacco Use  . Smoking status: Current Every Day Smoker    Packs/day: 1.00    Types: Cigarettes  . Smokeless tobacco: Never Used  . Tobacco comment: form given 08-15-12  Substance and Sexual Activity  . Alcohol use: Yes    Comment: 1 beer and 3 shots vodka daily  . Drug use: Yes    Types: Marijuana  . Sexual activity: Not on file  Lifestyle  . Physical activity:    Days per week: Not on file    Minutes per session: Not on file  . Stress: Not on file  Relationships  . Social connections:    Talks on phone: Not on file    Gets together: Not on file    Attends religious service: Not on file     Active member of club or organization: Not on file    Attends meetings of clubs or organizations: Not on file    Relationship status: Not on file  Other Topics Concern  . Not on file  Social History Narrative  . Not on file    Review of Systems: A 12 point ROS discussed and pertinent positives are indicated in the HPI above.  All other systems are negative.  Review of Systems  Constitutional: Positive for activity change and fever.  Respiratory: Negative for shortness of breath.   Cardiovascular: Negative for chest pain.  Gastrointestinal: Positive for abdominal pain.  Psychiatric/Behavioral: Negative for behavioral problems and confusion.    Vital Signs: BP 122/72 (BP Location: Left Arm)   Pulse 89   Temp 98.2 F (36.8 C) (Oral)   Resp 16   Ht 5\' 4"  (1.626 m)   Wt 130 lb 8.2 oz (59.2 kg)   SpO2 95%   BMI 22.40 kg/m   Physical Exam  Constitutional: He is oriented to person, place, and time.  Cardiovascular: Normal rate and regular rhythm.  Pulmonary/Chest: Effort normal and breath sounds normal.  Abdominal: Normal appearance. There is tenderness.  Neurological: He is alert and oriented to person, place, and time.  Skin: Skin is warm and dry.  Psychiatric: He has a normal mood and affect. His behavior is normal.  Nursing note and vitals reviewed.   Imaging: Dg Chest 1 View  Result Date: 03/30/2018 CLINICAL DATA:  Cough. EXAM: CHEST  1 VIEW COMPARISON:  Abdominal series with frontal chest x-ray from same day. Chest x-ray dated July 13, 2012. FINDINGS: Single lateral view demonstrates bibasilar atelectasis/scarring with patchy airspace disease in the right lower lobe. No pleural effusion. IMPRESSION: Bibasilar atelectasis/scarring with superimposed right lower lobe pneumonia. Electronically Signed   By: Titus Dubin M.D.   On: 03/30/2018 17:18   Dg Chest 2 View  Result Date: 04/09/2018 CLINICAL DATA:  Shortness of breath, right lower lobe pneumonia EXAM: CHEST - 2  VIEW COMPARISON:  Portable chest x-ray of April 08, 2018 FINDINGS: There is persistent volume loss on the right. There is a small right pleural effusion. Dense infiltrate is present in the lower lobe. On the left there is less prominent basilar density consistent with atelectasis or pneumonia and a small effusion. There is no mediastinal shift. There is no pneumothorax or pneumomediastinum. The heart and pulmonary vascularity are normal. There is calcification in the wall of the aortic arch. IMPRESSION: Bibasilar atelectasis or pneumonia with small bilateral pleural effusions greatest on the right. When compared to yesterday's study there may have been slight interval improvement. Thoracic aortic  atherosclerosis.  Are Electronically Signed   By: Dabid  Martinique M.D.   On: 04/09/2018 07:29   Dg Chest 2 View  Result Date: 03/31/2018 CLINICAL DATA:  Acute respiratory failure. Worsening shortness of breath for 1 week. Achalasia. EXAM: CHEST - 2 VIEW COMPARISON:  03/30/2018 FINDINGS: Increased infiltrates are seen in both lung bases, right side greater than left. Small bilateral pleural effusions are also noted. Low lung volumes are seen. Heart size is stable. Aortic atherosclerosis. IMPRESSION: Increased bibasilar infiltrates and small bilateral pleural effusions. Electronically Signed   By: Earle Gell M.D.   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 Evaluate for fluid collection on the right side of the chest. 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 Concern for a perforated duodenal ulcer on recent abdominal CT. EXAM: CT CHEST AND ABDOMEN WITH CONTRAST TECHNIQUE: Multidetector CT imaging of the chest and abdomen was performed following the standard protocol during bolus administration of intravenous contrast. CONTRAST:  167mL OMNIPAQUE IOHEXOL 300 MG/ML  SOLN COMPARISON:  CT of the abdomen pelvis on 03/30/2018  FINDINGS: CT CHEST FINDINGS Cardiovascular: Coronary artery calcifications. Normal caliber of the thoracic aorta. Motion artifact at the ascending thoracic aorta limits evaluation for dissection. An acute aortic process is thought to be unlikely. The central pulmonary arteries are patent. No significant pericardial fluid. Mediastinum/Nodes: The previously described fluid collection in the right lower chest is associated with a massively dilated esophagus. There appears to be dilute oral contrast within this diffusely dilated esophagus. There is a transition point at the GE junction and findings are suggestive for achalasia. No lymph node enlargement in the chest. Lungs/Pleura: The trachea and mainstem bronchi are patent but there is airway obstruction involving the bronchus intermedius with collapse of the right middle lobe and right lower lobe. Patient has diffuse centrilobular emphysema and most prominent at the right lung apex. Presumed scarring at the right lung apex. Small left pleural effusion with volume loss and consolidation in left lower lobe. 3 mm peripheral nodule in the left upper lobe on sequence 5, image 28 and sequence 6, image 35. Musculoskeletal: Unremarkable. CT ABDOMEN FINDINGS Hepatobiliary: Increased perihepatic ascites. Normal appearance of the liver without a suspicious lesion. No specific findings for cirrhosis. Portal venous system is patent. High-density material in the gallbladder is suggestive for vicarious excretion of the intravenous contrast probably related to the recent CT on 03/30/2018. No biliary dilatation. Pancreas: Unremarkable. No pancreatic ductal dilatation or surrounding inflammatory changes. Spleen: Normal in size without focal abnormality. Adrenals/Urinary Tract: Normal appearance of the adrenal glands. Normal appearance of both kidneys without hydronephrosis. Stomach/Bowel: Again noted is fluid and concern for inflammation in the porta hepatis region. Evidence for an  ulceration and possible contained rupture involving the duodenal bulb. This area is difficult to characterize due to the fluid and inflammation in this area. This ulceration or contained perforation is best seen on the delayed images, sequence 8, image 22. Difficult to exclude a tiny focus of extraluminal gas in this area but no significant free air. No significant small bowel dilatation. Visualized colon is unremarkable. Vascular/Lymphatic: Atherosclerotic calcifications involving the abdominal aorta and proximal iliac arteries. Negative for an aortic aneurysm or dissection. Main visceral arteries are patent. Other: Again noted is intra-abdominal ascites. Increased fluid around the liver. There continues to be fluid in left upper quadrant around the spleen and fluid in the upper abdomen between the stomach and transverse  colon. Musculoskeletal: Significant disc space narrowing and endplate disease in the lumbar spine. IMPRESSION: 1. The large fluid-filled structure in the right lower chest represents a massively dilated esophagus. Findings are most compatible with achalasia based on the imaging findings and previous endoscopy report. 2. Airway obstruction involving the bronchus intermedius with collapse of the right lower lobe and right middle lobe. Findings could be associated with aspiration but an endobronchial lesion cannot be excluded. Small left pleural effusion with volume loss and consolidation in the left lower lobe. 3. Abnormal appearance of the duodenal bulb. Findings are concerning for an ulceration and/or contained perforation in this area. 4. Intra-abdominal ascites.  Increased ascites around the liver. 5. Centrilobular emphysema with 3 mm nodule in the left upper lobe. No follow-up needed if patient is low-risk. Non-contrast chest CT can be considered in 12 months if patient is high-risk. This recommendation follows the consensus statement: Guidelines for Management of Incidental Pulmonary Nodules  Detected on CT Images: From the Fleischner Society 2017; Radiology 2017; 284:228-243. Electronically Signed   By: Markus Daft M.D.   On: 03/31/2018 10:09   Ct Angio Chest Pe W Or Wo Contrast  Result Date: 04/03/2018 CLINICAL DATA:  Shortness of breath, smoker EXAM: CT ANGIOGRAPHY CHEST WITH CONTRAST TECHNIQUE: Multidetector CT imaging of the chest was performed using the standard protocol during bolus administration of intravenous contrast. Multiplanar CT image reconstructions and MIPs were obtained to evaluate the vascular anatomy. CONTRAST:  165mL ISOVUE-370 IOPAMIDOL (ISOVUE-370) INJECTION 76% IV COMPARISON:  03/31/2018 FINDINGS: Cardiovascular: Aorta normal caliber with scattered atherosclerotic calcifications. Additional atherosclerotic calcifications at proximal great vessels and coronary arteries. No aortic aneurysm or dissection. No pericardial effusion. Pulmonary arteries well opacified and patent. No evidence of pulmonary embolism. Mediastinum/Nodes: Massive dilatation of the esophagus by air in fluid with abrupt distal tapering favoring achalasia. No thoracic adenopathy. Base of cervical region normal appearance. Lungs/Pleura: Moderate-sized BILATERAL pleural effusions. Complete atelectasis of RIGHT middle and RIGHT lower lobes. Subtotal atelectasis of remaining lobes. Bullous emphysematous changes at lung apices greater on RIGHT. Scattered upper lobe infiltrates. No pneumothorax. Upper Abdomen: Ascites.  Otherwise unremarkable. Musculoskeletal: No acute osseous findings. Review of the MIP images confirms the above findings. IMPRESSION: No evidence pulmonary embolism. BILATERAL moderate pleural effusions with complete atelectasis of RIGHT middle and RIGHT lower lobes as well as significant atelectasis of LEFT lower lobe and to lesser degrees posterior upper lobes. Upper lobe infiltrates especially on LEFT consistent with pneumonia common new since previous exam. Underlying bullous emphysematous  changes. Marked dilatation of the esophagus filled with air and fluid associated with abrupt distal tapering favoring achalasia. Aortic Atherosclerosis (ICD10-I70.0) and Emphysema (ICD10-J43.9). Electronically Signed   By: Lavonia Dana M.D.   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 Evaluate for fluid collection on the right side of the chest. 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 Concern for a perforated duodenal ulcer on recent abdominal CT. EXAM: CT CHEST AND ABDOMEN WITH CONTRAST TECHNIQUE: Multidetector CT imaging of the chest and abdomen was performed following the standard protocol during bolus administration of intravenous contrast. CONTRAST:  136mL OMNIPAQUE IOHEXOL 300 MG/ML  SOLN COMPARISON:  CT of the abdomen pelvis on 03/30/2018 FINDINGS: CT CHEST FINDINGS Cardiovascular: Coronary artery calcifications. Normal caliber of the thoracic aorta. Motion artifact at the ascending thoracic aorta limits evaluation for dissection. An acute aortic process is thought to be unlikely. The central pulmonary arteries are  patent. No significant pericardial fluid. Mediastinum/Nodes: The previously described fluid collection in the right lower chest is associated with a massively dilated esophagus. There appears to be dilute oral contrast within this diffusely dilated esophagus. There is a transition point at the GE junction and findings are suggestive for achalasia. No lymph node enlargement in the chest. Lungs/Pleura: The trachea and mainstem bronchi are patent but there is airway obstruction involving the bronchus intermedius with collapse of the right middle lobe and right lower lobe. Patient has diffuse centrilobular emphysema and most prominent at the right lung apex. Presumed scarring at the right lung apex. Small left pleural effusion with volume loss and consolidation in left lower lobe. 3 mm peripheral nodule  in the left upper lobe on sequence 5, image 28 and sequence 6, image 35. Musculoskeletal: Unremarkable. CT ABDOMEN FINDINGS Hepatobiliary: Increased perihepatic ascites. Normal appearance of the liver without a suspicious lesion. No specific findings for cirrhosis. Portal venous system is patent. High-density material in the gallbladder is suggestive for vicarious excretion of the intravenous contrast probably related to the recent CT on 03/30/2018. No biliary dilatation. Pancreas: Unremarkable. No pancreatic ductal dilatation or surrounding inflammatory changes. Spleen: Normal in size without focal abnormality. Adrenals/Urinary Tract: Normal appearance of the adrenal glands. Normal appearance of both kidneys without hydronephrosis. Stomach/Bowel: Again noted is fluid and concern for inflammation in the porta hepatis region. Evidence for an ulceration and possible contained rupture involving the duodenal bulb. This area is difficult to characterize due to the fluid and inflammation in this area. This ulceration or contained perforation is best seen on the delayed images, sequence 8, image 22. Difficult to exclude a tiny focus of extraluminal gas in this area but no significant free air. No significant small bowel dilatation. Visualized colon is unremarkable. Vascular/Lymphatic: Atherosclerotic calcifications involving the abdominal aorta and proximal iliac arteries. Negative for an aortic aneurysm or dissection. Main visceral arteries are patent. Other: Again noted is intra-abdominal ascites. Increased fluid around the liver. There continues to be fluid in left upper quadrant around the spleen and fluid in the upper abdomen between the stomach and transverse colon. Musculoskeletal: Significant disc space narrowing and endplate disease in the lumbar spine. IMPRESSION: 1. The large fluid-filled structure in the right lower chest represents a massively dilated esophagus. Findings are most compatible with achalasia  based on the imaging findings and previous endoscopy report. 2. Airway obstruction involving the bronchus intermedius with collapse of the right lower lobe and right middle lobe. Findings could be associated with aspiration but an endobronchial lesion cannot be excluded. Small left pleural effusion with volume loss and consolidation in the left lower lobe. 3. Abnormal appearance of the duodenal bulb. Findings are concerning for an ulceration and/or contained perforation in this area. 4. Intra-abdominal ascites.  Increased ascites around the liver. 5. Centrilobular emphysema with 3 mm nodule in the left upper lobe. No follow-up needed if patient is low-risk. Non-contrast chest CT can be considered in 12 months if patient is high-risk. This recommendation follows the consensus statement: Guidelines for Management of Incidental Pulmonary Nodules Detected on CT Images: From the Fleischner Society 2017; Radiology 2017; [id number]. Electronically Signed   By: Markus Daft M.D.   On: 03/31/2018 10:09   Ct Abdomen Pelvis W Contrast  Result Date: 04/08/2018 CLINICAL DATA:  Follow-up duodenal ulcer. EXAM: CT ABDOMEN AND PELVIS WITH CONTRAST TECHNIQUE: Multidetector CT imaging of the abdomen and pelvis was performed using the standard protocol following bolus administration of intravenous contrast. CONTRAST:  13mL  OMNIPAQUE IOHEXOL 300 MG/ML  SOLN COMPARISON:  CT abdomen 03/31/18 and CT abdomen pelvis 03/30/2018 FINDINGS: Lower chest: Small left pleural effusion is identified, similar to previous exam. Bilateral lower lobe atelectasis and consolidation is identified. Large hiatal hernia again noted. Moderate changes of emphysema identified. Aortic atherosclerosis noted. There are calcifications noted within the LAD and left circumflex coronary arteries. Hepatobiliary: There is no focal liver abnormality identified. Gallbladder is unremarkable. No biliary dilatation. Pancreas: Normal appearance of the pancreas. Spleen: No  focal splenic abnormality. Adrenals/Urinary Tract: The adrenal glands are normal. No kidney mass or hydronephrosis identified bilaterally. The urinary bladder appears normal. Stomach/Bowel: No abnormal distension of the stomach. Previously identified suspected duodenal ulcer is again noted involving the bulb, image 27/6. This contains a small focus of gas. No abnormal dilatation of the small bowel loops. No abnormal wall thickening or inflammation. No pathologic dilatation of the colon. Vascular/Lymphatic: Aortic atherosclerosis. No aneurysm. The portal vein and hepatic veins appear patent. Of thank you back atrophy no abdominal or pelvic adenopathy. No inguinal adenopathy. Reproductive: Prostate gland enlargement. Other: There is a large loculated fluid collection within the upper abdomen which extends to cover both the liver and spleen. In contrast to the previous exam this now has a well defined enhancing border. Further there has been progressive mass effect upon the spleen with caudal displacement of the spleen with respect to the left hemidiaphragm. These may be amendable to percutaneous drainage under image guidance. A small fluid collection measuring 2 cm is identified adjacent to the gastric antrum, image 44/3. No significant ascites or fluid collections within the abdomen or pelvis. No free intraperitoneal air noted at this time. 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 IMPRESSION: 1. Interval loculation of previous upper abdominal fluid which now covers both the liver and spleen and exerts mass effect upon the spleen. This may be amendable to percutaneous drainage. 2. Small duodenal bulb ulcer is less conspicuous than on the previous exam. 3. Bilateral lower lobe airspace consolidation in atelectasis. 4. Small left pleural effusion 5. Large hiatal hernia 6. Aortic atherosclerosis and LAD/left circumflex coronary artery atherosclerotic calcifications. Aortic  Atherosclerosis (ICD10-I70.0). Electronically Signed   By: Kerby Moors M.D.   On: 04/08/2018 14:40   Ct Abdomen Pelvis W Contrast  Addendum Date: 03/30/2018   ADDENDUM REPORT: 03/30/2018 22:03 ADDENDUM: Critical Value/emergent results were called by telephone at the time of interpretation on 03/30/2018 at 10:00 pm to Dr. Nanda Quinton , who verbally acknowledged these results. Electronically Signed   By: Elon Alas M.D.   On: 03/30/2018 22:03   Result Date: 03/30/2018 CLINICAL DATA:  Generalized abdominal pain for 2 days. History of stomach ulcer. EXAM: CT ABDOMEN AND PELVIS WITH CONTRAST TECHNIQUE: Multidetector CT imaging of the abdomen and pelvis was performed using the standard protocol following bolus administration of intravenous contrast. CONTRAST:  128mL OMNIPAQUE IOHEXOL 300 MG/ML  SOLN COMPARISON:  None. FINDINGS: LOWER CHEST: Partially imaged 8.1 x 5.1 cm rim enhancing fluid collection RIGHT lung base with adjacent consolidation with air bronchograms. Mild RIGHT middle lobe and LEFT lower lobe atelectasis versus pneumonia. HEPATOBILIARY: Liver and gallbladder are normal. PANCREAS: Normal. SPLEEN: Normal. ADRENALS/URINARY TRACT: Kidneys are orthotopic, demonstrating symmetric enhancement. No nephrolithiasis, hydronephrosis or solid renal masses. The unopacified ureters are normal in course and caliber. Delayed imaging through the kidneys demonstrates symmetric prompt contrast excretion within the proximal urinary collecting system. Urinary bladder is partially distended with layering densities on delayed phase. Normal adrenal glands. STOMACH/BOWEL: Apparent  dehiscent proximal duodenum concerning for ulcer tracking to through the serosal surface with potential punctate pneumoperitoneum (coronal image 26 and 27. Mild hyperemic small-bowel wall thickening. Mild colonic wall thickening, bowel is overall normal in course and caliber. Mildly edematous appendix similar to degree of breast:.  VASCULAR/LYMPHATIC: Aortoiliac vessels are normal in course and caliber. Moderate calcific atherosclerosis. No lymphadenopathy by CT size criteria. REPRODUCTIVE: Normal. OTHER: Moderate amount of low-density free fluid without rim enhancing fluid collections. Mild mesenteric edema. MUSCULOSKELETAL: Nonacute.  Severe L4-5 and L5-S1 degenerative disc. IMPRESSION: 1. 8.1 x 5.1 cm RIGHT lung base fluid collection, this is likely pleural-based seen with empyema, atypical appearing hiatal hernia or patulous esophagus/achalasia. RIGHT potentially LEFT lung base pneumonia. Recommend CT chest with contrast and concurrent CT esophagram. 2. Probable perforated duodenal ulcer the punctate pneumoperitoneum. Moderate volume ascites. 3. Enterocolitis, presumably reactive. 4. Layering density in the urinary bladder concerning for calcification, less likely contrast. Electronically Signed: By: Elon Alas M.D. On: 03/30/2018 22:00   Dg Chest Port 1 View  Result Date: 04/08/2018 CLINICAL DATA:  Acute respiratory failure with hypoxia. EXAM: PORTABLE CHEST 1 VIEW COMPARISON:  1 day prior FINDINGS: Midline trachea. Normal heart size. Atherosclerosis in the transverse aorta. Small bilateral pleural effusions persist. No pneumothorax. Right greater than left base airspace disease is not significantly changed. Numerous leads and wires project over the chest. IMPRESSION: No significant change since one day prior. Bilateral pleural effusions with right greater than left base airspace disease. On the left, most likely atelectasis. At the right lung base, infection or aspiration could look similar. Electronically Signed   By: Abigail Miyamoto M.D.   On: 04/08/2018 07:19   Dg Chest Port 1 View  Result Date: 04/07/2018 CLINICAL DATA:  Respiratory failure. EXAM: PORTABLE CHEST 1 VIEW COMPARISON:  04/06/2018 and CT chest 04/03/2018. FINDINGS: Trachea is midline. Heart size stable. Thoracic aorta is calcified. Mild bibasilar airspace  opacification persists with improvement in aeration from yesterday's exam. Small bilateral pleural effusions persist. IMPRESSION: 1. Improving bibasilar airspace opacification, which may be due to atelectasis and/or pneumonia. 2. Small bilateral pleural effusions, grossly stable. Electronically Signed   By: Lorin Picket M.D.   On: 04/07/2018 07:21   Dg Chest Port 1 View  Result Date: 04/06/2018 CLINICAL DATA:  Acute respiratory failure EXAM: PORTABLE CHEST 1 VIEW COMPARISON:  04/05/2018 FINDINGS: Cardiac shadow is stable. Patchy bilateral infiltrates are noted with small effusions. The overall appearance given some patient positioning differences is stable. No new focal infiltrate is seen. No bony abnormality is noted. IMPRESSION: Stable bilateral effusions and bibasilar infiltrates. Electronically Signed   By: Inez Catalina M.D.   On: 04/06/2018 06:36   Dg Chest Port 1 View  Result Date: 04/05/2018 CLINICAL DATA:  Respiratory failure EXAM: PORTABLE CHEST 1 VIEW COMPARISON:  04/03/2018 FINDINGS: NG tube has been removed. Small bilateral pleural effusions. Bilateral mid and lower lung airspace opacities, not significantly changed. Heart appears normal size. Dilated air and fluid-filled esophagus again noted. IMPRESSION: Moderate bilateral effusions with bilateral perihilar and lower lobe airspace opacities which could represent atelectasis, pneumonia or edema. No real change. Electronically Signed   By: Rolm Baptise M.D.   On: 04/05/2018 07:15   Dg Chest Port 1 View  Result Date: 04/03/2018 CLINICAL DATA:  Shortness of breath. Atelectasis. Perforated duodenal ulcer. EXAM: PORTABLE CHEST 1 VIEW COMPARISON:  03/31/2018 FINDINGS: There has been placement of a nasogastric tube, which is looped within a large hiatal hernia with distal tip near the GE  junction. Moderate bilateral pleural effusions and bibasilar atelectasis or consolidation shows no significant change. Heart size is stable. No pneumothorax  visualized. IMPRESSION: New nasogastric tube is looped within a large hiatal hernia, with distal tip near the GE junction. No significant change in moderate bilateral pleural effusions and bibasilar atelectasis or consolidation. Electronically Signed   By: Earle Gell M.D.   On: 04/03/2018 08:57   Dg Abd Acute W/chest  Result Date: 03/30/2018 CLINICAL DATA:  Shortness of breath and abdominal pain EXAM: DG ABDOMEN ACUTE W/ 1V CHEST COMPARISON:  Chest radiograph and abdominal radiograph July 13, 2012 FINDINGS: PA chest: There is atelectatic change in lung bases with patchy consolidation in the right base. The lungs elsewhere are clear. Heart size and pulmonary vascularity are normal. No adenopathy. There is aortic atherosclerosis. Supine and upright abdomen: There is moderate stool throughout the colon. There is no bowel dilatation or air-fluid level to suggest bowel obstruction. No free air. Stomach appears filled with fluid. There is degenerative change in the lumbar spine. IMPRESSION: Bibasilar atelectasis with probable early pneumonia right base. No bowel obstruction or free air.  Fluid-filled stomach evident. Electronically Signed   By: Lowella Grip III M.D.   On: 03/30/2018 13:56   Dg Addison Bailey G Tube Plc W/fl W/rad  Result Date: 04/01/2018 CLINICAL DATA:  Achalasia with megaesophagus. NG tube placement requested. EXAM: NASO G TUBE PLACEMENT WITH FL AND WITH RAD CONTRAST:  10 cc Isovue 300 per tube. FLUOROSCOPY TIME:  Fluoroscopy Time:  2 minutes 48 seconds Number of Acquired Spot Images: 0 COMPARISON:  03/31/2018 CT chest. FINDINGS: Left nasal route nasoenteric tube was advanced to the level of the esophagogastric junction. The tube could not be traversed into the stomach. The tip was left at the esophagogastric junction. IMPRESSION: Left nasal route nasoenteric tube advanced to the level of the esophagogastric junction, unable to traverse into the stomach. Enteric tube tip location confirmed at the  esophagogastric junction. These results were called by telephone at the time of interpretation on 04/01/2018 at 11:28 am to Dr. Rolm Bookbinder , who verbally acknowledged these results. Electronically Signed   By: Ilona Sorrel M.D.   On: 04/01/2018 11:39    Labs:  CBC: Recent Labs    04/04/18 0317 04/05/18 0358 04/07/18 0351 04/08/18 0423  WBC 19.8* 17.3* 18.4* 23.7*  HGB 11.4* 13.0 13.3 12.8*  HCT 32.4* 37.3* 39.6 37.9*  PLT 325 385 515* 523*    COAGS: Recent Labs    03/30/18 1720 03/31/18 0331  INR 1.07 1.39  APTT  --  32    BMP: Recent Labs    04/06/18 0759 04/07/18 0351 04/07/18 1950 04/08/18 0423  NA 133* 133* 132* 130*  K 2.3* 2.9* 4.9 4.4  CL 93* 94* 96* 98*  CO2 30 28 26 25   GLUCOSE 137* 99 123* 111*  BUN 14 14 17 13   CALCIUM 8.4* 8.4* 8.5* 8.4*  CREATININE 0.68 0.66 0.72 0.64  GFRNONAA >60 >60 >60 >60  GFRAA >60 >60 >60 >60    LIVER FUNCTION TESTS: Recent Labs    03/30/18 1308 03/31/18 0331 04/01/18 0332  BILITOT 1.7* 1.1 0.9  AST 24 18 18   ALT 17 12* 13*  ALKPHOS 59 37* 45  PROT 7.2 5.3* 5.5*  ALBUMIN 4.0 2.9* 2.3*    TUMOR MARKERS: No results for input(s): AFPTM, CEA, CA199, CHROMGRNA in the last 8760 hours.  Assessment and Plan:  Contained perforated duod ulcer abd pain; leukocytosis Scheduled for intra abdominal abscess  drain placement Risks and benefits discussed with the patient including bleeding, infection, damage to adjacent structures, bowel perforation/fistula connection, and sepsis.  All of the patient's questions were answered, patient is agreeable to proceed. Consent signed and in chart.   Thank you for this interesting consult.  I greatly enjoyed meeting Raymond Jacobs and look forward to participating in their care.  A copy of this report was sent to the requesting provider on this date.  Electronically Signed: Lavonia Drafts, PA-C 04/09/2018, 1:49 PM   I spent a total of 40 Minutes    in face to face in  clinical consultation, greater than 50% of which was counseling/coordinating care for abd abscess drain placement

## 2018-04-09 NOTE — Progress Notes (Signed)
PULMONARY / CRITICAL CARE MEDICINE     Name: Raymond Jacobs MRN: [id number] DOB: 03/20/1951    ADMISSION DATE:  03/30/2018 CONSULTATION DATE:  03/31/2018  REFERRING MD:  Georgiann Cocker  CHIEF COMPLAINT:  Abdominal pain   HISTORY OF PRESENT ILLNESS:  67 year old who was admitted on 6/7 for abdominal pain.  He was not having any nausea vomiting or change in bowel habits.  A CT scan of the abdomen showed a small amount of ascites and was suggestive of a duodenal  ulcer but showed no free air.  He was suspected of having a sealed duodenal perforation and was admitted for antibiotics and observation.  The same CT scan suggested a fluid collection in the chest.  Subsequently a CT of the chest was obtained which were hugely dilated esophagus and also suggested obstruction of the bronchus intermedius with collapse of the right middle and right lower lobes.  No mediastinal adenopathy is noted.  We were consulted for this finding.  The patient denies being unusually short of breath.  He denies anything other than his usual daily cough, he has had some chills since being admitted to the hospital.  He is thin but says that his weight is stable.  He is a 100-pack-year smoker and continues to smoke.  He says he was aware of some adenopathy in his groins and axilla but those went away when he took [place].   SUBJECTIVE:  Now net -3 L Note persistent leukocytosis Repeat CT abdomen as below    VITAL SIGNS: BP 122/72 (BP Location: Left Arm)   Pulse 89   Temp 98.2 F (36.8 C) (Oral)   Resp 16   Ht 5\' 4"  (1.626 m)   Wt 59.2 kg (130 lb 8.2 oz)   SpO2 95%   BMI 22.40 kg/m   HEMODYNAMICS:    VENTILATOR SETTINGS:    INTAKE / OUTPUT: I/O last 3 completed shifts: In: 20 [P.O.:420] Out: 2650 [Urine:2650]  PHYSICAL EXAMINATION: General: thin adult male in NAD HEENT: MM pink/moist Neuro: AAOx4, speech clear, MAE CV: s1s2 rrr, no m/r/g PULM: even/non-labored, lungs bilaterally mildly diminished  lower PP:JKDT, non-tender, bsx4 active  Extremities: warm/dry, no edema  Skin: no rashes or lesions  LABS:  BMET Recent Labs  Lab 04/07/18 0351 04/07/18 1950 04/08/18 0423  NA 133* 132* 130*  K 2.9* 4.9 4.4  CL 94* 96* 98*  CO2 28 26 25   BUN 14 17 13   CREATININE 0.66 0.72 0.64  GLUCOSE 99 123* 111*    Electrolytes Recent Labs  Lab 04/05/18 0358  04/07/18 0351 04/07/18 1950 04/08/18 0423  CALCIUM 8.5*   < > 8.4* 8.5* 8.4*  MG 1.7  --  1.8  --   --   PHOS 3.6  --  3.4  --   --    < > = values in this interval not displayed.    CBC Recent Labs  Lab 04/05/18 0358 04/07/18 0351 04/08/18 0423  WBC 17.3* 18.4* 23.7*  HGB 13.0 13.3 12.8*  HCT 37.3* 39.6 37.9*  PLT 385 515* 523*    Coag's No results for input(s): APTT, INR in the last 168 hours.  Sepsis Markers No results for input(s): LATICACIDVEN, PROCALCITON, O2SATVEN in the last 168 hours.  ABG Recent Labs  Lab 04/03/18 1641 04/05/18 1042  PHART 7.488* 7.544*  PCO2ART 28.7* 32.0  PO2ART 57.2* 49.6*   On NRFM   Liver Enzymes No results for input(s): AST, ALT, ALKPHOS, BILITOT, ALBUMIN in the last  168 hours.  Cardiac Enzymes No results for input(s): TROPONINI, PROBNP in the last 168 hours.  Glucose Recent Labs  Lab 04/04/18 1113  GLUCAP 153*    Imaging Dg Chest 2 View  Result Date: 04/09/2018 CLINICAL DATA:  Shortness of breath, right lower lobe pneumonia EXAM: CHEST - 2 VIEW COMPARISON:  Portable chest x-ray of April 08, 2018 FINDINGS: There is persistent volume loss on the right. There is a small right pleural effusion. Dense infiltrate is present in the lower lobe. On the left there is less prominent basilar density consistent with atelectasis or pneumonia and a small effusion. There is no mediastinal shift. There is no pneumothorax or pneumomediastinum. The heart and pulmonary vascularity are normal. There is calcification in the wall of the aortic arch. IMPRESSION: Bibasilar atelectasis or  pneumonia with small bilateral pleural effusions greatest on the right. When compared to yesterday's study there may have been slight interval improvement. Thoracic aortic atherosclerosis.  Are Electronically Signed   By: Muneer  Martinique M.D.   On: 04/09/2018 07:29   Ct Abdomen Pelvis W Contrast  Result Date: 04/08/2018 CLINICAL DATA:  Follow-up duodenal ulcer. EXAM: CT ABDOMEN AND PELVIS WITH CONTRAST TECHNIQUE: Multidetector CT imaging of the abdomen and pelvis was performed using the standard protocol following bolus administration of intravenous contrast. CONTRAST:  181mL OMNIPAQUE IOHEXOL 300 MG/ML  SOLN COMPARISON:  CT abdomen 03/31/18 and CT abdomen pelvis 03/30/2018 FINDINGS: Lower chest: Small left pleural effusion is identified, similar to previous exam. Bilateral lower lobe atelectasis and consolidation is identified. Large hiatal hernia again noted. Moderate changes of emphysema identified. Aortic atherosclerosis noted. There are calcifications noted within the LAD and left circumflex coronary arteries. Hepatobiliary: There is no focal liver abnormality identified. Gallbladder is unremarkable. No biliary dilatation. Pancreas: Normal appearance of the pancreas. Spleen: No focal splenic abnormality. Adrenals/Urinary Tract: The adrenal glands are normal. No kidney mass or hydronephrosis identified bilaterally. The urinary bladder appears normal. Stomach/Bowel: No abnormal distension of the stomach. Previously identified suspected duodenal ulcer is again noted involving the bulb, image 27/6. This contains a small focus of gas. No abnormal dilatation of the small bowel loops. No abnormal wall thickening or inflammation. No pathologic dilatation of the colon. Vascular/Lymphatic: Aortic atherosclerosis. No aneurysm. The portal vein and hepatic veins appear patent. Of thank you back atrophy no abdominal or pelvic adenopathy. No inguinal adenopathy. Reproductive: Prostate gland enlargement. Other: There is a  large loculated fluid collection within the upper abdomen which extends to cover both the liver and spleen. In contrast to the previous exam this now has a well defined enhancing border. Further there has been progressive mass effect upon the spleen with caudal displacement of the spleen with respect to the left hemidiaphragm. These may be amendable to percutaneous drainage under image guidance. A small fluid collection measuring 2 cm is identified adjacent to the gastric antrum, image 44/3. No significant ascites or fluid collections within the abdomen or pelvis. No free intraperitoneal air noted at this time. 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 IMPRESSION: 1. Interval loculation of previous upper abdominal fluid which now covers both the liver and spleen and exerts mass effect upon the spleen. This may be amendable to percutaneous drainage. 2. Small duodenal bulb ulcer is less conspicuous than on the previous exam. 3. Bilateral lower lobe airspace consolidation in atelectasis. 4. Small left pleural effusion 5. Large hiatal hernia 6. Aortic atherosclerosis and LAD/left circumflex coronary artery atherosclerotic calcifications. Aortic Atherosclerosis (ICD10-I70.0). Electronically Signed  By: Kerby Moors M.D.   On: 04/08/2018 14:40    STUDIES:  CT ABD 6/8 >> large fluid filled structure in the right lower chest representing massively dilated esophagus, airway obstruction involving the bronchus intermedius CTA Chest 6/11 >> neg for PE, bilateral pleural effusions, upper lobe infiltrates, marked dilation of the esophagus filled with air/fluid associated with abrupt tapering favoring achalasia  CT abdomen 6/16 >> interval loculation of her previous upper abdominal fluid collection with proximity to and mass-effect on the liver and spleen, bilateral lower lobe atelectasis, small left pleural effusion, large hiatal hernia  CULTURES: Sputum 6/12 >> abudant WBC, few  yeast Blood culture x2 6/7 >> negative Urine 6/7 >> Insignificant growth  ANTIBIOTICS: Flagyl 6/7 >> 6/7 Zosyn 6/8 >> 6/13  EVENTS: 6/07 Admit  6/14 80% HFNC 6/15 Net neg 1.8L in last 24 hours.  O2 weaned to 20L / 30% FiO2.   DISCUSSION: 67 year old admitted for abdominal pain who likely has a sealed perforation of the duodenum.  Incidentally radiological studies have shown a is usually dilated esophagus and compression of the bronchus intermedius with collapse of the right middle and right lower lobes. Pt with bilateral pleural effusions and severe emphysema.   He has an abdominal fluid collection that will be drained 6/17.    ASSESSMENT / PLAN:  A: Acute Hypoxic Respiratory failure - in setting of emphysema, bilateral pleural effusions, pulmonary edema and restriction / atelectasis Residual L effusion on CT 6/16 -BMP 115, Echo 6/12: EF 78-58%, grade 1 diastolic dysfunction -Bedside US on 6/13 revealed small bilateral pleural effusions not requiring Thoracentesis -Sputum culture with WBC's and few yeast, likely normal flora Abdominal fluid collection s/p contained duodenal perforation.   P: Note plan for percutaneous drain of abdominal fluid collection in interventional radiology.  Certainly this will help with regard to possible infectious process but it also may help with his abdominal compliance, restrictive component of his hypoxemia.  May even help with his left pleural effusion. Continue to follow intermittent chest x-ray I will stop DuoNeb on 6/17, start Anoro once daily Albuterol HFA 2 puffs every 4 hours as needed Diuretics completed, follow I's/O and effusions Push mobility, pulmonary hygiene. Continue to wean his oxygen, currently on 2 L/min.  He will need ambulatory oximetry prior to discharge so that we can gauge his home requirement. Follow-up pulmonary appointment made with Dr. [name] on 7/17, PFTs prior.  We will arrange  Baltazar Apo, MD, PhD 04/09/2018, 10:49  AM [facility] [phone] or if no answer [phone]

## 2018-04-10 ENCOUNTER — Inpatient Hospital Stay (HOSPITAL_COMMUNITY): Payer: Medicare Other

## 2018-04-10 DIAGNOSIS — J449 Chronic obstructive pulmonary disease, unspecified: Secondary | ICD-10-CM

## 2018-04-10 LAB — PROTIME-INR
INR: 1.06
PROTHROMBIN TIME: 13.7 s (ref 11.4–15.2)

## 2018-04-10 LAB — BASIC METABOLIC PANEL
ANION GAP: 10 (ref 5–15)
BUN: 15 mg/dL (ref 6–20)
CALCIUM: 9 mg/dL (ref 8.9–10.3)
CO2: 25 mmol/L (ref 22–32)
Chloride: 95 mmol/L — ABNORMAL LOW (ref 101–111)
Creatinine, Ser: 0.66 mg/dL (ref 0.61–1.24)
GFR calc Af Amer: >60 mL/min (ref >60)
GFR calc non Af Amer: >60 mL/min (ref >60)
GLUCOSE: 96 mg/dL (ref 65–99)
Potassium: 4.3 mmol/L (ref 3.5–5.1)
Sodium: 130 mmol/L — ABNORMAL LOW (ref 135–145)

## 2018-04-10 MED ORDER — ADULT MULTIVITAMIN W/MINERALS CH
1.0000 | ORAL_TABLET | Freq: Every day | ORAL | Status: DC
  Administered 2018-04-10 – 2018-04-13 (×4): 1 via ORAL
  Filled 2018-04-10 (×4): qty 1

## 2018-04-10 MED ORDER — MIDAZOLAM HCL 2 MG/2ML IJ SOLN
INTRAMUSCULAR | Status: AC | PRN
  Administered 2018-04-10: 1 mg via INTRAVENOUS

## 2018-04-10 MED ORDER — FENTANYL CITRATE (PF) 100 MCG/2ML IJ SOLN
INTRAMUSCULAR | Status: AC
  Filled 2018-04-10: qty 4

## 2018-04-10 MED ORDER — LIDOCAINE HCL 1 % IJ SOLN
INTRAMUSCULAR | Status: AC
Start: 2018-04-10 — End: 2018-04-11
  Filled 2018-04-10: qty 20

## 2018-04-10 MED ORDER — MIDAZOLAM HCL 2 MG/2ML IJ SOLN
INTRAMUSCULAR | Status: AC
  Filled 2018-04-10: qty 4

## 2018-04-10 MED ORDER — FENTANYL CITRATE (PF) 100 MCG/2ML IJ SOLN
INTRAMUSCULAR | Status: AC | PRN
  Administered 2018-04-10: 50 ug via INTRAVENOUS

## 2018-04-10 MED ORDER — SODIUM CHLORIDE 0.9% FLUSH
5.0000 mL | Freq: Three times a day (TID) | INTRAVENOUS | Status: DC
Start: 2018-04-10 — End: 2018-04-13
  Administered 2018-04-10 – 2018-04-13 (×8): 5 mL

## 2018-04-10 MED ORDER — PANTOPRAZOLE SODIUM 40 MG PO TBEC
40.0000 mg | DELAYED_RELEASE_TABLET | Freq: Every day | ORAL | Status: DC
  Administered 2018-04-11 – 2018-04-13 (×3): 40 mg via ORAL
  Filled 2018-04-10 (×3): qty 1

## 2018-04-10 NOTE — Telephone Encounter (Signed)
Patient still admitted.

## 2018-04-10 NOTE — Sedation Documentation (Signed)
Patient denies pain and is resting comfortably.  

## 2018-04-10 NOTE — Procedures (Signed)
ABDOMINAL ABSCESS  S/P CT DRAIN  No comp Stable EBL 0 PUS ASPIRATED CX SENT

## 2018-04-10 NOTE — Progress Notes (Signed)
PULMONARY / CRITICAL CARE MEDICINE     Name: Raymond Jacobs MRN: [id number] DOB: [birth year]    ADMISSION DATE:  03/30/2018 CONSULTATION DATE:  03/31/2018  REFERRING MD:  Georgiann Cocker  CHIEF COMPLAINT:  Abdominal pain   HISTORY OF PRESENT ILLNESS:  67 year old who was admitted on 6/7 for abdominal pain.  He was not having any nausea vomiting or change in bowel habits.  A CT scan of the abdomen showed a small amount of ascites and was suggestive of a duodenal  ulcer but showed no free air.  He was suspected of having a sealed duodenal perforation and was admitted for antibiotics and observation.  The same CT scan suggested a fluid collection in the chest.  Subsequently a CT of the chest was obtained which were hugely dilated esophagus and also suggested obstruction of the bronchus intermedius with collapse of the right middle and right lower lobes.  No mediastinal adenopathy is noted.  We were consulted for this finding.  The patient denies being unusually short of breath.  He denies anything other than his usual daily cough, he has had some chills since being admitted to the hospital.  He is thin but says that his weight is stable.  He is a 100-pack-year smoker and continues to smoke.  He says he was aware of some adenopathy in his groins and axilla but those went away when he took [place].   SUBJECTIVE:  Negative 4.7L total He hasn't learned the Anoro yet, planning to get it for first time today Awaiting perc drain of abdominal fluid collection today Took his O2 off for part of the night last night, currently on 2L/min    VITAL SIGNS: BP 113/66 (BP Location: Left Arm)   Pulse (!) 104   Temp 98.9 F (37.2 C) (Oral)   Resp 16   Ht 5\' 4"  (1.626 m)   Wt 59.2 kg (130 lb 8.2 oz)   SpO2 96%   BMI 22.40 kg/m   HEMODYNAMICS:    VENTILATOR SETTINGS:    INTAKE / OUTPUT: I/O last 3 completed shifts: In: 8 [P.O.:460] Out: 2275 [Urine:2275]  PHYSICAL EXAMINATION: General: Thin  gentleman, comfortable lying in bed, oxygen on HEENT: Oropharynx clear Neuro: Awake, alert, nonfocal, follows all commands CV: Regular, no murmur PULM: Decreased breath sounds, no wheezing or crackles GI: Soft, mild diffuse tenderness, positive bowel sounds Extremities: No edema Skin: No rash  LABS:  BMET Recent Labs  Lab 04/07/18 0351 04/07/18 1950 04/08/18 0423  NA 133* 132* 130*  K 2.9* 4.9 4.4  CL 94* 96* 98*  CO2 28 26 25   BUN 14 17 13   CREATININE 0.66 0.72 0.64  GLUCOSE 99 123* 111*    Electrolytes Recent Labs  Lab 04/05/18 0358  04/07/18 0351 04/07/18 1950 04/08/18 0423  CALCIUM 8.5*   < > 8.4* 8.5* 8.4*  MG 1.7  --  1.8  --   --   PHOS 3.6  --  3.4  --   --    < > = values in this interval not displayed.    CBC Recent Labs  Lab 04/05/18 0358 04/07/18 0351 04/08/18 0423  WBC 17.3* 18.4* 23.7*  HGB 13.0 13.3 12.8*  HCT 37.3* 39.6 37.9*  PLT 385 515* 523*    Coag's Recent Labs  Lab 04/10/18 0608  INR 1.06    Sepsis Markers No results for input(s): LATICACIDVEN, PROCALCITON, O2SATVEN in the last 168 hours.  ABG Recent Labs  Lab 04/03/18 1641 04/05/18 1042  PHART 7.488* 7.544*  PCO2ART 28.7* 32.0  PO2ART 57.2* 49.6*   On NRFM   Liver Enzymes No results for input(s): AST, ALT, ALKPHOS, BILITOT, ALBUMIN in the last 168 hours.  Cardiac Enzymes No results for input(s): TROPONINI, PROBNP in the last 168 hours.  Glucose Recent Labs  Lab 04/04/18 1113  GLUCAP 153*    Imaging No results found.  STUDIES:  CT ABD 6/8 >> large fluid filled structure in the right lower chest representing massively dilated esophagus, airway obstruction involving the bronchus intermedius CTA Chest 6/11 >> neg for PE, bilateral pleural effusions, upper lobe infiltrates, marked dilation of the esophagus filled with air/fluid associated with abrupt tapering favoring achalasia  CT abdomen 6/16 >> interval loculation of her previous upper abdominal fluid  collection with proximity to and mass-effect on the liver and spleen, bilateral lower lobe atelectasis, small left pleural effusion, large hiatal hernia  CULTURES: Sputum 6/12 >> abudant WBC, few yeast Blood culture x2 6/7 >> negative Urine 6/7 >> Insignificant growth  ANTIBIOTICS: Flagyl 6/7 >> 6/7 Zosyn 6/8 >> 6/13  EVENTS: 6/07 Admit  6/14 80% HFNC 6/15 Net neg 1.8L in last 24 hours.  O2 weaned to 20L / 30% FiO2.   DISCUSSION: 67 year old admitted for abdominal pain who likely has a sealed perforation of the duodenum.  Incidentally radiological studies have shown a is usually dilated esophagus and compression of the bronchus intermedius with collapse of the right middle and right lower lobes. Pt with bilateral pleural effusions and severe emphysema.   He has an abdominal fluid collection that is supposed to be drained 6/18  ASSESSMENT / PLAN:  A: Acute Hypoxic Respiratory failure - in setting of emphysema, bilateral pleural effusions, pulmonary edema and restriction / atelectasis Residual L effusion on CT 6/16 -BMP 115, Echo 6/12: EF 03-00%, grade 1 diastolic dysfunction -Bedside US on 6/13 revealed small bilateral pleural effusions not requiring Thoracentesis -Sputum culture with WBC's and few yeast, likely normal flora Abdominal fluid collection s/p contained duodenal perforation.   P: Abdominal fluid collection not yet drained, planning to do this 6/18.  Certainly this will help with regard to possible infectious process but it also may help with his abdominal compliance, restrictive component of his hypoxemia.  May even help with his left pleural effusion. Follow intermittent chest x-ray He is to start Anoro once daily today 6/18 Albuterol 2 puffs every 4 hours if needed Push mobility, pulmonary hygiene Wean oxygen as tolerated.  He went without while at rest last night, may be able to wean to room air at rest.  He will need a walking oximetry prior to discharge in order to  determine whether he needs oxygen at home. Check BMP given his vigorous diuresis last 48 hours.  Follow-up pulmonary appointment made with Dr. [name] on 7/17, PFTs prior.  We will arrange  Baltazar Apo, MD, PhD 04/10/2018, 9:39 AM [facility] [phone] or if no answer [phone]

## 2018-04-10 NOTE — Progress Notes (Signed)
Patient ID: Raymond Jacobs, male   DOB: 06/08/1951, 67 y.o.   MRN: [id number]       Subjective: Patient with no new complaints.  Off O2, but supposed to be wearing it.  He took it off himself overnight and didn't replace it.  Objective: Vital signs in last 24 hours: Temp:  [98.3 F (36.8 C)-98.9 F (37.2 C)] 98.9 F (37.2 C) (06/18 0551) Pulse Rate:  [104-106] 104 (06/18 0551) Resp:  [16-18] 16 (06/18 0551) BP: (109-113)/(66-70) 113/66 (06/18 0551) SpO2:  [95 %-97 %] 96 % (06/18 0551) Last BM Date: 04/09/18  Intake/Output from previous day: 06/17 0701 - 06/18 0700 In: 240 [P.O.:240] Out: 1875 [Urine:1875] Intake/Output this shift: No intake/output data recorded.  PE: Heart: regular Lungs: CTAB, does use some accessories with breathing, but has the whole time he has been here Abd: soft, NT, ND, +BS  Lab Results:  Recent Labs    04/08/18 0423  WBC 23.7*  HGB 12.8*  HCT 37.9*  PLT 523*   BMET Recent Labs    04/07/18 1950 04/08/18 0423  NA 132* 130*  K 4.9 4.4  CL 96* 98*  CO2 26 25  GLUCOSE 123* 111*  BUN 17 13  CREATININE 0.72 0.64  CALCIUM 8.5* 8.4*   PT/INR Recent Labs    04/10/18 0608  LABPROT 13.7  INR 1.06   CMP     Component Value Date/Time   NA 130 (L) 04/08/2018 0423   K 4.4 04/08/2018 0423   CL 98 (L) 04/08/2018 0423   CO2 25 04/08/2018 0423   GLUCOSE 111 (H) 04/08/2018 0423   BUN 13 04/08/2018 0423   CREATININE 0.64 04/08/2018 0423   CALCIUM 8.4 (L) 04/08/2018 0423   PROT 5.5 (L) 04/01/2018 0332   ALBUMIN 2.3 (L) 04/01/2018 0332   AST 18 04/01/2018 0332   ALT 13 (L) 04/01/2018 0332   ALKPHOS 45 04/01/2018 0332   BILITOT 0.9 04/01/2018 0332   GFRNONAA >60 04/08/2018 0423   GFRAA >60 04/08/2018 0423   Lipase     Component Value Date/Time   LIPASE 35 03/30/2018 1308       Studies/Results: Dg Chest 2 View  Result Date: 04/09/2018 CLINICAL DATA:  Shortness of breath, right lower lobe pneumonia EXAM: CHEST - 2 VIEW  COMPARISON:  Portable chest x-ray of April 08, 2018 FINDINGS: There is persistent volume loss on the right. There is a small right pleural effusion. Dense infiltrate is present in the lower lobe. On the left there is less prominent basilar density consistent with atelectasis or pneumonia and a small effusion. There is no mediastinal shift. There is no pneumothorax or pneumomediastinum. The heart and pulmonary vascularity are normal. There is calcification in the wall of the aortic arch. IMPRESSION: Bibasilar atelectasis or pneumonia with small bilateral pleural effusions greatest on the right. When compared to yesterday's study there may have been slight interval improvement. Thoracic aortic atherosclerosis.  Are Electronically Signed   By: Kellon  Martinique M.D.   On: 04/09/2018 07:29   Ct Abdomen Pelvis W Contrast  Result Date: 04/08/2018 CLINICAL DATA:  Follow-up duodenal ulcer. EXAM: CT ABDOMEN AND PELVIS WITH CONTRAST TECHNIQUE: Multidetector CT imaging of the abdomen and pelvis was performed using the standard protocol following bolus administration of intravenous contrast. CONTRAST:  177mL OMNIPAQUE IOHEXOL 300 MG/ML  SOLN COMPARISON:  CT abdomen 03/31/18 and CT abdomen pelvis 03/30/2018 FINDINGS: Lower chest: Small left pleural effusion is identified, similar to previous exam. Bilateral lower lobe atelectasis and consolidation  is identified. Large hiatal hernia again noted. Moderate changes of emphysema identified. Aortic atherosclerosis noted. There are calcifications noted within the LAD and left circumflex coronary arteries. Hepatobiliary: There is no focal liver abnormality identified. Gallbladder is unremarkable. No biliary dilatation. Pancreas: Normal appearance of the pancreas. Spleen: No focal splenic abnormality. Adrenals/Urinary Tract: The adrenal glands are normal. No kidney mass or hydronephrosis identified bilaterally. The urinary bladder appears normal. Stomach/Bowel: No abnormal distension of the  stomach. Previously identified suspected duodenal ulcer is again noted involving the bulb, image 27/6. This contains a small focus of gas. No abnormal dilatation of the small bowel loops. No abnormal wall thickening or inflammation. No pathologic dilatation of the colon. Vascular/Lymphatic: Aortic atherosclerosis. No aneurysm. The portal vein and hepatic veins appear patent. Of thank you back atrophy no abdominal or pelvic adenopathy. No inguinal adenopathy. Reproductive: Prostate gland enlargement. Other: There is a large loculated fluid collection within the upper abdomen which extends to cover both the liver and spleen. In contrast to the previous exam this now has a well defined enhancing border. Further there has been progressive mass effect upon the spleen with caudal displacement of the spleen with respect to the left hemidiaphragm. These may be amendable to percutaneous drainage under image guidance. A small fluid collection measuring 2 cm is identified adjacent to the gastric antrum, image 44/3. No significant ascites or fluid collections within the abdomen or pelvis. No free intraperitoneal air noted at this time. 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 IMPRESSION: 1. Interval loculation of previous upper abdominal fluid which now covers both the liver and spleen and exerts mass effect upon the spleen. This may be amendable to percutaneous drainage. 2. Small duodenal bulb ulcer is less conspicuous than on the previous exam. 3. Bilateral lower lobe airspace consolidation in atelectasis. 4. Small left pleural effusion 5. Large hiatal hernia 6. Aortic atherosclerosis and LAD/left circumflex coronary artery atherosclerotic calcifications. Aortic Atherosclerosis (ICD10-I70.0). Electronically Signed   By: Kerby Moors M.D.   On: 04/08/2018 14:40    Anti-infectives: Anti-infectives (From admission, onward)   Start     Dose/Rate Route Frequency Ordered Stop   03/31/18  0115  piperacillin-tazobactam (ZOSYN) IVPB 3.375 g     3.375 g 12.5 mL/hr over 240 Minutes Intravenous Every 8 hours 03/31/18 0106 04/05/18 0559   03/30/18 1615  cefTRIAXone (ROCEPHIN) 2 g in sodium chloride 0.9 % 100 mL IVPB  Status:  Discontinued     2 g 200 mL/hr over 30 Minutes Intravenous Every 24 hours 03/30/18 1613 03/30/18 2321   03/30/18 1615  azithromycin (ZITHROMAX) 500 mg in sodium chloride 0.9 % 250 mL IVPB  Status:  Discontinued     500 mg 250 mL/hr over 60 Minutes Intravenous Every 24 hours 03/30/18 1613 03/30/18 2321   03/30/18 1615  metroNIDAZOLE (FLAGYL) IVPB 500 mg     500 mg 100 mL/hr over 60 Minutes Intravenous  Once 03/30/18 1613 03/30/18 1902       Assessment/Plan Contained perforated duodenal ulcer -adv to soft diet06/14 -Zosyn d 5/5- stopped -WBCremains elevated,monitor.afebrile -previous free fluid has become loculated.  IR to drain today  Achalasia  -appreciate TCTS evaluation. Feel no acute intervention is warranted at this time due to its chronicity.  RML/RLL consolidation with emphysema Acute hypoxic resp failure - Appreciate Pulmonary management - resp CXshows few yeast.no treatment at this time perCCM -he took his O2 off overnight, but has been down to[place] 4L with25% Fi51from 80%, overall improving pulmonary status, CCMwill  likley sign off and will see pt for output f/u - chest xray today showedImproving bibasilar airspace opacification, grossly stable b/l effusions  ETOH use -1 beer and 3 shots of vodka a day -no evidence of withdrawal  Hypokalemia -4.4today. 58mEq of KDur BID, monitor, am labs  Hyponatremia - 130 6/16, salt tabs BID, check BMET today   TUU:EKCM VTE: SCD's, lovenox KL:KJZPH 06/08-06/13 WBC slightly up to 23.5CT shows abscess- ask IR to consider drain  Foley:none Follow up:TBD   LOS: 11 days    [name] , [facility] Surgery 04/10/2018, 9:16 AM Pager:  [phone]

## 2018-04-10 NOTE — Progress Notes (Signed)
Nutrition Follow-up  DOCUMENTATION CODES:   Severe malnutrition in context of chronic illness  INTERVENTION:   -Once diet is advanced:  -Resume Magic Cup TID with meals, each supplement provides 290 kcals and 9 grams protein -Resume Ensure Enlive po BID, each supplement provides 350 kcal and 20 grams of protein -MVI with minerals daily  NUTRITION DIAGNOSIS:   Severe Malnutrition related to chronic illness(etoh abuse, GI ulcers) as evidenced by severe fat depletion, severe muscle depletion.  Ongoing  GOAL:   Patient will meet greater than or equal to 90% of their needs  Progressing  MONITOR:   Diet advancement, Labs, Weight trends, Skin, I & O's  REASON FOR ASSESSMENT:   Malnutrition Screening Tool    ASSESSMENT:   67 y/o male with h/o etoh abuse, peptic ulcers, chronic NSAID use who was admitted on 6/7 for abdominal pain. CT scan of the abdomen showed a small amount of ascites and was suggestive of a duodenal ulcer but showed no free air.  He was suspected of having a sealed duodenal perforation and was admitted for antibiotics and observation.  The same CT scan suggested a fluid collection in the chest. Subsequently a CT of the chest was obtained which showed a hugely dilated esophagus and also suggested obstruction of the bronchus intermedius with collapse of the right middle and right lower lobes.  6/14- advanced to soft diet 6/16- transferred from ICU to floor  Pt out of room at time of visit.   Plan to IR to place drain d/t intra abdominal abscess fluid collection. Currently NPO for procedure. Per MAR, pt was consuming Ensure supplements.   Labs reviewed: Na: 130.   Diet Order:   Diet Order           Diet NPO time specified Except for: Sips with Meds  Diet effective midnight          EDUCATION NEEDS:   Education needs have been addressed  Skin:  Skin Assessment: Reviewed RN Assessment  Last BM:  04/09/18  Height:   Ht Readings from Last 1  Encounters:  03/31/18 5\' 4"  (1.626 m)    Weight:   Wt Readings from Last 1 Encounters:  03/31/18 130 lb 8.2 oz (59.2 kg)    Ideal Body Weight:  59 kg  BMI:  Body mass index is 22.4 kg/m.  Estimated Nutritional Needs:   Kcal:  1800-2000  Protein:  90-105 grams  Fluid:  > 1.8 L    Imogean Ciampa A. Jimmye Norman, RD, LDN, CDE Pager: [phone] After hours Pager: [phone]

## 2018-04-11 DIAGNOSIS — J9601 Acute respiratory failure with hypoxia: Secondary | ICD-10-CM

## 2018-04-11 DIAGNOSIS — J449 Chronic obstructive pulmonary disease, unspecified: Secondary | ICD-10-CM

## 2018-04-11 LAB — BASIC METABOLIC PANEL
Anion gap: 7 (ref 5–15)
BUN: 16 mg/dL (ref 6–20)
CHLORIDE: 95 mmol/L — AB (ref 101–111)
CO2: 26 mmol/L (ref 22–32)
CREATININE: 0.34 mg/dL — AB (ref 0.61–1.24)
Calcium: 8.7 mg/dL — ABNORMAL LOW (ref 8.9–10.3)
GFR calc Af Amer: >60 mL/min (ref >60)
Glucose, Bld: 102 mg/dL — ABNORMAL HIGH (ref 65–99)
Potassium: 4.3 mmol/L (ref 3.5–5.1)
SODIUM: 128 mmol/L — AB (ref 135–145)

## 2018-04-11 LAB — CBC
HCT: 38.2 % — ABNORMAL LOW (ref 39.0–52.0)
Hemoglobin: 12.9 g/dL — ABNORMAL LOW (ref 13.0–17.0)
MCH: 28.5 pg (ref 26.0–34.0)
MCHC: 33.8 g/dL (ref 30.0–36.0)
MCV: 84.3 fL (ref 78.0–100.0)
PLATELETS: 899 10*3/uL — AB (ref 150–400)
RBC: 4.53 MIL/uL (ref 4.22–5.81)
RDW: 14 % (ref 11.5–15.5)
WBC: 18.5 10*3/uL — ABNORMAL HIGH (ref 4.0–10.5)

## 2018-04-11 NOTE — Evaluation (Signed)
Physical Therapy Evaluation Patient Details Name: Raymond Jacobs MRN: [id number] DOB: 03-Dec-1950 Today's Date: 04/11/2018   History of Present Illness  Pt is a 67 y/o male admitted secondary to abdominal pain who likely has a sealed perforation of the duodenum.  Incidentally radiological studies have shown a is usually dilated esophagus and compression of the bronchus intermedius with collapse of the right middle and right lower lobes. Pt with bilateral pleural effusions and severe emphysema. Pt with acute hypoxic respiratory failure with Residual L effusion on CT 6/16. Pt is s/p CT drain for abdominal abscess on 04/10/18. PMH including but not limited to stomach ulcer.    Clinical Impression  Pt presented supine in bed with HOB elevated, awake and willing to participate in therapy session. Prior to admission, pt reported that he was very independent and active. Pt stated that he has friends and family that can assist him intermittently if needed. He is currently able to perform bed mobility with supervision, transfers with supervision and ambulated in hallway with supervision. Pt with no LOB or need for physical assistance. No further acute PT needs identified at this time. PT signing off.  Of note, pt with SPO2 decreasing to mid 80's with activity on RA. He required a sitting rest break of several minutes with reapplication of 2L of O2 via [place] for SPO2 to increase to 90%. Pt's RN was notified. PT encouraged pt to use IS and observed pt to perform x5 sitting EOB.    Follow Up Recommendations No PT follow up;Supervision - Intermittent    Equipment Recommendations  None recommended by PT    Recommendations for Other Services       Precautions / Restrictions Precautions Precautions: Fall Precaution Comments: L JP drain Restrictions Weight Bearing Restrictions: No      Mobility  Bed Mobility Overal bed mobility: Modified Independent                Transfers Overall transfer level:  Modified independent Equipment used: None                Ambulation/Gait Ambulation/Gait assistance: Supervision Gait Distance (Feet): 200 Feet Assistive device: None Gait Pattern/deviations: Step-through pattern;Decreased stride length Gait velocity: WFL Gait velocity interpretation: >2.62 ft/sec, indicative of community ambulatory General Gait Details: pt steady with gait without an AD; no LOB or need for physical assistance  Stairs            Wheelchair Mobility    Modified Rankin (Stroke Patients Only)       Balance Overall balance assessment: No apparent balance deficits (not formally assessed)                                           Pertinent Vitals/Pain Pain Assessment: No/denies pain    Home Living Family/patient expects to be discharged to:: Private residence Living Arrangements: Alone Available Help at Discharge: Friend(s);Available PRN/intermittently Type of Home: Apartment Home Access: Level entry     Home Layout: One level Home Equipment: None      Prior Function Level of Independence: Independent               Hand Dominance        Extremity/Trunk Assessment   Upper Extremity Assessment Upper Extremity Assessment: Overall WFL for tasks assessed    Lower Extremity Assessment Lower Extremity Assessment: Overall WFL for tasks assessed  Communication   Communication: No difficulties  Cognition Arousal/Alertness: Awake/alert Behavior During Therapy: WFL for tasks assessed/performed Overall Cognitive Status: Within Functional Limits for tasks assessed                                        General Comments      Exercises     Assessment/Plan    PT Assessment Patent does not need any further PT services  PT Problem List         PT Treatment Interventions      PT Goals (Current goals can be found in the Care Plan section)  Acute Rehab PT Goals Patient Stated Goal: return  home    Frequency     Barriers to discharge        Co-evaluation               AM-PAC PT "6 Clicks" Daily Activity  Outcome Measure Difficulty turning over in bed (including adjusting bedclothes, sheets and blankets)?: None Difficulty moving from lying on back to sitting on the side of the bed? : None Difficulty sitting down on and standing up from a chair with arms (e.g., wheelchair, bedside commode, etc,.)?: None Help needed moving to and from a bed to chair (including a wheelchair)?: None Help needed walking in hospital room?: None Help needed climbing 3-5 steps with a railing? : A Little 6 Click Score: 23    End of Session Equipment Utilized During Treatment: Gait belt Activity Tolerance: Patient tolerated treatment well Patient left: in bed;with call bell/phone within reach Nurse Communication: Mobility status PT Visit Diagnosis: Other abnormalities of gait and mobility (R26.89)    Time: 2878-6767 PT Time Calculation (min) (ACUTE ONLY): 24 min   Charges:   PT Evaluation $PT Eval Low Complexity: 1 Low PT Treatments $Gait Training: 8-22 mins   PT G Codes:        [place], PT, DPT [place] 04/11/2018, 5:54 PM

## 2018-04-11 NOTE — Progress Notes (Signed)
Subjective/Chief Complaint: abdominal pain  Pt feels better after drain    Objective: Vital signs in last 24 hours: Temp:  [98.2 F (36.8 C)-98.6 F (37 C)] 98.4 F (36.9 C) (06/19 0558) Pulse Rate:  [93-114] 95 (06/19 0558) Resp:  [14-18] 18 (06/19 0558) BP: (107-125)/(57-85) 107/80 (06/19 0558) SpO2:  [91 %-98 %] 96 % (06/19 0558) Last BM Date: 04/09/18  Intake/Output from previous day: 06/18 0701 - 06/19 0700 In: 0  Out: 940 [Urine:600; Drains:340] Intake/Output this shift: No intake/output data recorded.  General appearance: alert and cooperative Resp: clear to auscultation bilaterally Cardio: regular rate and rhythm, S1, S2 normal, no murmur, click, rub or gallop GI: soft NT ND drain in place   Lab Results:  Recent Labs    04/11/18 0555  WBC 18.5*  HGB 12.9*  HCT 38.2*  PLT 899*   BMET Recent Labs    04/10/18 0608 04/11/18 0555  NA 130* 128*  K 4.3 4.3  CL 95* 95*  CO2 25 26  GLUCOSE 96 102*  BUN 15 16  CREATININE 0.66 0.34*  CALCIUM 9.0 8.7*   PT/INR Recent Labs    04/10/18 0608  LABPROT 13.7  INR 1.06   ABG No results for input(s): PHART, HCO3 in the last 72 hours.  Invalid input(s): PCO2, PO2  Studies/Results: Ct Image Guided Drainage By Percutaneous Catheter  Result Date: 04/10/2018 INDICATION: Left upper quadrant perisplenic abscess EXAM: CT GUIDED DRAINAGE OF LEFT UPPER QUADRANT PERISPLENIC ABSCESS MEDICATIONS: The patient is currently admitted to the hospital and receiving intravenous antibiotics. The antibiotics were administered within an appropriate time frame prior to the initiation of the procedure. ANESTHESIA/SEDATION: 1.0 mg IV Versed 50 mcg IV Fentanyl Moderate Sedation Time:  17 MINUTES The patient was continuously monitored during the procedure by the interventional radiology nurse under my direct supervision. COMPLICATIONS: None immediate. TECHNIQUE: Informed written consent was obtained from the patient after a thorough  discussion of the procedural risks, benefits and alternatives. All questions were addressed. Maximal Sterile Barrier Technique was utilized including caps, mask, sterile gowns, sterile gloves, sterile drape, hand hygiene and skin antiseptic. A timeout was performed prior to the initiation of the procedure. PROCEDURE: The left upper quadrant was prepped with ChloraPrep in a sterile fashion, and a sterile drape was applied covering the operative field. A sterile gown and sterile gloves were used for the procedure. Local anesthesia was provided with 1% Lidocaine. Previous imaging reviewed. Patient position left anterior oblique. Noncontrast localization CT performed. The left upper quadrant perisplenic abscess was localized. Overlying skin marked for a lateral lower intercostal approach. Under sterile conditions and local anesthesia, an 18 gauge 10 cm access needle was advanced into the perisplenic abscess. Needle position confirmed with CT. Syringe aspiration yielded purulent fluid. Sample sent for culture. Amplatz guidewire inserted followed by tract dilatation to insert a 12 [place] drain. Drain catheter position confirmed with CT. 2 30 cc purulent fluid aspirated. Catheter secured with a Prolene suture and connected to external suction bulb. Sterile dressing applied. No immediate complication. Patient tolerated the procedure well. FINDINGS: Imaging confirms percutaneous needle access of the perisplenic abscess for drain insertion IMPRESSION: Successful CT-guided left upper quadrant perisplenic abscess drain placement Electronically Signed   By: Jerilynn Mages.  Shick M.D.   On: 04/10/2018 15:23    Anti-infectives: Anti-infectives (From admission, onward)   Start     Dose/Rate Route Frequency Ordered Stop   03/31/18 0115  piperacillin-tazobactam (ZOSYN) IVPB 3.375 g     3.375 g 12.5  mL/hr over 240 Minutes Intravenous Every 8 hours 03/31/18 0106 04/05/18 0559   03/30/18 1615  cefTRIAXone (ROCEPHIN) 2 g in sodium chloride  0.9 % 100 mL IVPB  Status:  Discontinued     2 g 200 mL/hr over 30 Minutes Intravenous Every 24 hours 03/30/18 1613 03/30/18 2321   03/30/18 1615  azithromycin (ZITHROMAX) 500 mg in sodium chloride 0.9 % 250 mL IVPB  Status:  Discontinued     500 mg 250 mL/hr over 60 Minutes Intravenous Every 24 hours 03/30/18 1613 03/30/18 2321   03/30/18 1615  metroNIDAZOLE (FLAGYL) IVPB 500 mg     500 mg 100 mL/hr over 60 Minutes Intravenous  Once 03/30/18 1613 03/30/18 1902      Assessment/Plan:  Contained perforated duodenal ulcer -adv to soft diet06/14 -Zosyn d 5/5- stopped -WBCbetter ,monitor.afebrile -previous free fluid has become loculated.  IR drain placed  Achalasia  -appreciate TCTS evaluation. Feel no acute intervention is warranted at this time due to its chronicity.  RML/RLL consolidation with emphysema Acute hypoxic resp failure - Appreciate Pulmonary management - resp CXshows few yeast.no treatment at this time perCCM -he took his O2 off overnight, but has been down to[place] 4L with25% Fi26from 80%, overall improving pulmonary status, CCMwill likley sign off and will see pt for output f/u - chest xray today showedImproving bibasilar airspace opacification, grossly stable b/l effusions  ETOH use -1 beer and 3 shots of vodka a day -no evidence of withdrawal  Hypokalemia -4.4today. 78mEq of KDur BID, monitor, am labs  Hyponatremia - 130 6/16, salt tabs BID, check BMET today   GTX:MIWO VTE: SCD's, lovenox EH:OZYYQ 06/08-06/13 WBC slightly down to 18, 500  Foley:none Follow up:TBD     LOS: 12 days    Marcello Moores A Krystyn Picking 04/11/2018

## 2018-04-11 NOTE — Telephone Encounter (Signed)
Pt still admitted.

## 2018-04-11 NOTE — Progress Notes (Signed)
PULMONARY / CRITICAL CARE MEDICINE     Name: Raymond Jacobs MRN: [id number] DOB: [birth year]    ADMISSION DATE:  03/30/2018 CONSULTATION DATE:  03/31/2018  REFERRING MD:  Georgiann Cocker  CHIEF COMPLAINT:  Abdominal pain   HISTORY OF PRESENT ILLNESS:  67 year old who was admitted on 6/7 for abdominal pain.  He was not having any nausea vomiting or change in bowel habits.  A CT scan of the abdomen showed a small amount of ascites and was suggestive of a duodenal  ulcer but showed no free air.  He was suspected of having a sealed duodenal perforation and was admitted for antibiotics and observation.  The same CT scan suggested a fluid collection in the chest.  Subsequently a CT of the chest was obtained which were hugely dilated esophagus and also suggested obstruction of the bronchus intermedius with collapse of the right middle and right lower lobes.  No mediastinal adenopathy is noted.  We were consulted for this finding.  The patient denies being unusually short of breath.  He denies anything other than his usual daily cough, he has had some chills since being admitted to the hospital.  He is thin but says that his weight is stable.  He is a 100-pack-year smoker and continues to smoke.  He says he was aware of some adenopathy in his groins and axilla but those went away when he took [place].   SUBJECTIVE:   Feels better after fluid removal, on room air appears comfortable.  VITAL SIGNS: Blood Pressure 107/80 (BP Location: Right Arm)   Pulse 95   Temperature 98.4 F (36.9 C) (Oral)   Respiration 18   Height 5\' 4"  (1.626 m)   Weight 130 lb 8.2 oz (59.2 kg)   Oxygen Saturation 96%   Body Mass Index 22.40 kg/m   HEMODYNAMICS:    VENTILATOR SETTINGS:    INTAKE / OUTPUT: I/O last 3 completed shifts: In: 0  Out: 8299 [Urine:1375; Drains:340]  PHYSICAL EXAMINATION: General: This is a thin 67 year old male currently lying in bed.  He is speaking full sentences and in no acute  distress. HEENT normocephalic atraumatic no jugular venous distention Pulmonary scattered rhonchi, no accessory use.  Has rhonchorous cough Cardiac: Regular rate and rhythm Abdomen: Soft nontender Extremities: Brisk cap refill no edema Neuro: Awake oriented no focal deficits LABS:  BMET Recent Labs  Lab 04/08/18 0423 04/10/18 0608 04/11/18 0555  NA 130* 130* 128*  K 4.4 4.3 4.3  CL 98* 95* 95*  CO2 25 25 26   BUN 13 15 16   CREATININE 0.64 0.66 0.34*  GLUCOSE 111* 96 102*    Electrolytes Recent Labs  Lab 04/05/18 0358  04/07/18 0351  04/08/18 0423 04/10/18 0608 04/11/18 0555  CALCIUM 8.5*   < > 8.4*   < > 8.4* 9.0 8.7*  MG 1.7  --  1.8  --   --   --   --   PHOS 3.6  --  3.4  --   --   --   --    < > = values in this interval not displayed.    CBC Recent Labs  Lab 04/07/18 0351 04/08/18 0423 04/11/18 0555  WBC 18.4* 23.7* 18.5*  HGB 13.3 12.8* 12.9*  HCT 39.6 37.9* 38.2*  PLT 515* 523* 899*    Coag's Recent Labs  Lab 04/10/18 0608  INR 1.06    Sepsis Markers No results for input(s): LATICACIDVEN, PROCALCITON, O2SATVEN in the last 168 hours.  ABG Recent  Labs  Lab 04/05/18 1042  PHART 7.544*  PCO2ART 32.0  PO2ART 49.6*   On NRFM   Liver Enzymes No results for input(s): AST, ALT, ALKPHOS, BILITOT, ALBUMIN in the last 168 hours.  Cardiac Enzymes No results for input(s): TROPONINI, PROBNP in the last 168 hours.  Glucose No results for input(s): GLUCAP in the last 168 hours.  Imaging Ct Image Guided Drainage By Percutaneous Catheter  Result Date: 04/10/2018 INDICATION: Left upper quadrant perisplenic abscess EXAM: CT GUIDED DRAINAGE OF LEFT UPPER QUADRANT PERISPLENIC ABSCESS MEDICATIONS: The patient is currently admitted to the hospital and receiving intravenous antibiotics. The antibiotics were administered within an appropriate time frame prior to the initiation of the procedure. ANESTHESIA/SEDATION: 1.0 mg IV Versed 50 mcg IV Fentanyl  Moderate Sedation Time:  17 MINUTES The patient was continuously monitored during the procedure by the interventional radiology nurse under my direct supervision. COMPLICATIONS: None immediate. TECHNIQUE: Informed written consent was obtained from the patient after a thorough discussion of the procedural risks, benefits and alternatives. All questions were addressed. Maximal Sterile Barrier Technique was utilized including caps, mask, sterile gowns, sterile gloves, sterile drape, hand hygiene and skin antiseptic. A timeout was performed prior to the initiation of the procedure. PROCEDURE: The left upper quadrant was prepped with ChloraPrep in a sterile fashion, and a sterile drape was applied covering the operative field. A sterile gown and sterile gloves were used for the procedure. Local anesthesia was provided with 1% Lidocaine. Previous imaging reviewed. Patient position left anterior oblique. Noncontrast localization CT performed. The left upper quadrant perisplenic abscess was localized. Overlying skin marked for a lateral lower intercostal approach. Under sterile conditions and local anesthesia, an 18 gauge 10 cm access needle was advanced into the perisplenic abscess. Needle position confirmed with CT. Syringe aspiration yielded purulent fluid. Sample sent for culture. Amplatz guidewire inserted followed by tract dilatation to insert a 12 [place] drain. Drain catheter position confirmed with CT. 2 30 cc purulent fluid aspirated. Catheter secured with a Prolene suture and connected to external suction bulb. Sterile dressing applied. No immediate complication. Patient tolerated the procedure well. FINDINGS: Imaging confirms percutaneous needle access of the perisplenic abscess for drain insertion IMPRESSION: Successful CT-guided left upper quadrant perisplenic abscess drain placement Electronically Signed   By: Jerilynn Mages.  Shick M.D.   On: 04/10/2018 15:23    STUDIES:  CT ABD 6/8 >> large fluid filled structure in  the right lower chest representing massively dilated esophagus, airway obstruction involving the bronchus intermedius CTA Chest 6/11 >> neg for PE, bilateral pleural effusions, upper lobe infiltrates, marked dilation of the esophagus filled with air/fluid associated with abrupt tapering favoring achalasia  CT abdomen 6/16 >> interval loculation of her previous upper abdominal fluid collection with proximity to and mass-effect on the liver and spleen, bilateral lower lobe atelectasis, small left pleural effusion, large hiatal hernia  CULTURES: Sputum 6/12 >> abudant WBC, few yeast Blood culture x2 6/7 >> negative Urine 6/7 >> Insignificant growth  ANTIBIOTICS: Flagyl 6/7 >> 6/7 Zosyn 6/8 >> 6/13  EVENTS: 6/07 Admit  6/14 80% HFNC 6/15 Net neg 1.8L in last 24 hours.  O2 weaned to 20L / 30% FiO2.    ASSESSMENT / PLAN:  Acute Hypoxic Respiratory failure - in setting of emphysema, bilateral pleural effusions, pulmonary edema and restriction / atelectasis Residual L effusion on CT 6/16 -BMP 115, Echo 6/12: EF 03-50%, grade 1 diastolic dysfunction -Bedside US on 6/13 revealed small bilateral pleural effusions not requiring Thoracentesis -Sputum culture with  WBC's and few yeast, likely normal flora Abdominal fluid collection s/p contained duodenal perforation.   DISCUSSION: 67 year old admitted for abdominal pain who likely has a sealed perforation of the duodenum.  Incidentally radiological studies have shown a is usually dilated esophagus and compression of the bronchus intermedius with collapse of the right middle and right lower lobes. Pt with bilateral pleural effusions and severe emphysema.   He has an abdominal fluid collection that is supposed to be drained 6/18 6/19: feels better s/p abd drainage  Plan Cont anoro daily PRN albuterol pulm hygiene  Wean oxygen and ensure walking pulse oximetry to determine o2 needs prior to dc I have once again instructed him about reflux  precautions given his esophagus and risk for reflux and potentially aspiration  We will sign off please call as needed  Follow-up pulmonary appointment made with Dr. [name] on 7/17, PFTs prior.  We will arrange   Erick Colace ACNP-BC [place] Pager # [phone] OR # [phone] if no answer

## 2018-04-12 LAB — CBC
HEMATOCRIT: 37.3 % — AB (ref 39.0–52.0)
Hemoglobin: 12.6 g/dL — ABNORMAL LOW (ref 13.0–17.0)
MCH: 28.1 pg (ref 26.0–34.0)
MCHC: 33.8 g/dL (ref 30.0–36.0)
MCV: 83.1 fL (ref 78.0–100.0)
Platelets: 950 10*3/uL (ref 150–400)
RBC: 4.49 MIL/uL (ref 4.22–5.81)
RDW: 13.4 % (ref 11.5–15.5)
WBC: 20.1 10*3/uL — ABNORMAL HIGH (ref 4.0–10.5)

## 2018-04-12 LAB — COMPREHENSIVE METABOLIC PANEL
ALBUMIN: 2.4 g/dL — AB (ref 3.5–5.0)
ALK PHOS: 180 U/L — AB (ref 38–126)
ALT: 25 U/L (ref 17–63)
ANION GAP: 10 (ref 5–15)
AST: 19 U/L (ref 15–41)
BILIRUBIN TOTAL: 1 mg/dL (ref 0.3–1.2)
BUN: 12 mg/dL (ref 6–20)
CALCIUM: 9 mg/dL (ref 8.9–10.3)
CO2: 25 mmol/L (ref 22–32)
Chloride: 90 mmol/L — ABNORMAL LOW (ref 101–111)
Creatinine, Ser: 0.73 mg/dL (ref 0.61–1.24)
GFR calc non Af Amer: >60 mL/min (ref >60)
GLUCOSE: 134 mg/dL — AB (ref 65–99)
Potassium: 4.4 mmol/L (ref 3.5–5.1)
Sodium: 125 mmol/L — ABNORMAL LOW (ref 135–145)
TOTAL PROTEIN: 6.7 g/dL (ref 6.5–8.1)

## 2018-04-12 MED ORDER — ENOXAPARIN SODIUM 40 MG/0.4ML ~~LOC~~ SOLN
40.0000 mg | SUBCUTANEOUS | Status: DC
  Administered 2018-04-12: 40 mg via SUBCUTANEOUS
  Filled 2018-04-12: qty 0.4

## 2018-04-12 NOTE — Progress Notes (Signed)
Subjective/Chief Complaint: abdominalpain Feels well tolerating diet walking    Objective: Vital signs in last 24 hours: Temp:  [98.2 F (36.8 C)-98.9 F (37.2 C)] 98.2 F (36.8 C) (06/20 0444) Pulse Rate:  [94-104] 94 (06/20 0444) Resp:  [16] 16 (06/20 0444) BP: (115-118)/(69-80) 115/69 (06/20 0444) SpO2:  [92 %-94 %] 94 % (06/20 0444) Last BM Date: 04/12/18  Intake/Output from previous day: 06/19 0701 - 06/20 0700 In: 125 [P.O.:120] Out: 215 [Urine:150; Drains:65] Intake/Output this shift: No intake/output data recorded.  General appearance: alert and cooperative Resp: clear to auscultation bilaterally Cardio: regular rate and rhythm, S1, S2 normal, no murmur, click, rub or gallop GI: soft, non-tender; bowel sounds normal; no masses,  no organomegaly drain serous   Lab Results:  Recent Labs    04/11/18 0555  WBC 18.5*  HGB 12.9*  HCT 38.2*  PLT 899*   BMET Recent Labs    04/10/18 0608 04/11/18 0555  NA 130* 128*  K 4.3 4.3  CL 95* 95*  CO2 25 26  GLUCOSE 96 102*  BUN 15 16  CREATININE 0.66 0.34*  CALCIUM 9.0 8.7*   PT/INR Recent Labs    04/10/18 0608  LABPROT 13.7  INR 1.06   ABG No results for input(s): PHART, HCO3 in the last 72 hours.  Invalid input(s): PCO2, PO2  Studies/Results: Ct Image Guided Drainage By Percutaneous Catheter  Result Date: 04/10/2018 INDICATION: Left upper quadrant perisplenic abscess EXAM: CT GUIDED DRAINAGE OF LEFT UPPER QUADRANT PERISPLENIC ABSCESS MEDICATIONS: The patient is currently admitted to the hospital and receiving intravenous antibiotics. The antibiotics were administered within an appropriate time frame prior to the initiation of the procedure. ANESTHESIA/SEDATION: 1.0 mg IV Versed 50 mcg IV Fentanyl Moderate Sedation Time:  17 MINUTES The patient was continuously monitored during the procedure by the interventional radiology nurse under my direct supervision. COMPLICATIONS: None immediate. TECHNIQUE:  Informed written consent was obtained from the patient after a thorough discussion of the procedural risks, benefits and alternatives. All questions were addressed. Maximal Sterile Barrier Technique was utilized including caps, mask, sterile gowns, sterile gloves, sterile drape, hand hygiene and skin antiseptic. A timeout was performed prior to the initiation of the procedure. PROCEDURE: The left upper quadrant was prepped with ChloraPrep in a sterile fashion, and a sterile drape was applied covering the operative field. A sterile gown and sterile gloves were used for the procedure. Local anesthesia was provided with 1% Lidocaine. Previous imaging reviewed. Patient position left anterior oblique. Noncontrast localization CT performed. The left upper quadrant perisplenic abscess was localized. Overlying skin marked for a lateral lower intercostal approach. Under sterile conditions and local anesthesia, an 18 gauge 10 cm access needle was advanced into the perisplenic abscess. Needle position confirmed with CT. Syringe aspiration yielded purulent fluid. Sample sent for culture. Amplatz guidewire inserted followed by tract dilatation to insert a 12 [place] drain. Drain catheter position confirmed with CT. 2 30 cc purulent fluid aspirated. Catheter secured with a Prolene suture and connected to external suction bulb. Sterile dressing applied. No immediate complication. Patient tolerated the procedure well. FINDINGS: Imaging confirms percutaneous needle access of the perisplenic abscess for drain insertion IMPRESSION: Successful CT-guided left upper quadrant perisplenic abscess drain placement Electronically Signed   By: Jerilynn Mages.  Shick M.D.   On: 04/10/2018 15:23    Anti-infectives: Anti-infectives (From admission, onward)   Start     Dose/Rate Route Frequency Ordered Stop   03/31/18 0115  piperacillin-tazobactam (ZOSYN) IVPB 3.375 g  3.375 g 12.5 mL/hr over 240 Minutes Intravenous Every 8 hours 03/31/18 0106  04/05/18 0559   03/30/18 1615  cefTRIAXone (ROCEPHIN) 2 g in sodium chloride 0.9 % 100 mL IVPB  Status:  Discontinued     2 g 200 mL/hr over 30 Minutes Intravenous Every 24 hours 03/30/18 1613 03/30/18 2321   03/30/18 1615  azithromycin (ZITHROMAX) 500 mg in sodium chloride 0.9 % 250 mL IVPB  Status:  Discontinued     500 mg 250 mL/hr over 60 Minutes Intravenous Every 24 hours 03/30/18 1613 03/30/18 2321   03/30/18 1615  metroNIDAZOLE (FLAGYL) IVPB 500 mg     500 mg 100 mL/hr over 60 Minutes Intravenous  Once 03/30/18 1613 03/30/18 1902      Assessment/Plan:   Contained perforated duodenal ulcer -adv to soft diet06/14 -Zosyn d 5/5- stopped  -previous free fluid has become loculated. IR drain placed  Achalasia  -appreciate TCTS evaluation. Feel no acute intervention is warranted at this time due to its chronicity.  RML/RLL consolidation with emphysema Acute hypoxic resp failure - Appreciate Pulmonary management - resp CXshows few yeast.no treatment at this time perCCM -he took his O2 off overnight, but has [place] 4L with25% Fi17from 80%, overall improving pulmonary status, CCMwill likley sign off and will see pt for output f/u - chest xray today showedImproving bibasilar airspace opacification, grossly stable b/l effusions  ETOH use -1 beer and 3 shots of vodka a day -no evidence of withdrawal  Hypokalemia -4.4today. 6mEq of KDur BID, monitor, am labs  Hyponatremia - 1306/16, salt tabs BID,check BMET today   NOM:VEHM VTE: SCD's, lovenox CN:OBSJG 06/08-06/13 WBC slightly down to 18, 500  yesterday  Recheck labs today  Foley:none Follow up:TBD Discharge Friday will need to get home o2 needs set u Contained perforated duodenal ulcer -adv to soft diet06/14 -Zosyn d 5/5- stopped -WBCbetter ,monitor.afebrile -previous free fluid has become loculated. IR drain placed  Achalasia  -appreciate TCTS evaluation. Feel no  acute intervention is warranted at this time due to its chronicity.  RML/RLL consolidation with emphysema Acute hypoxic resp failure - Appreciate Pulmonary management - resp CXshows few yeast.no treatment at this time perCCM -he took his O2 off overnight, but has [place] 4L with25% Fi95from 80%, overall improving pulmonary status, CCMwill likley sign off and will see pt for output f/u - chest xray today showedImproving bibasilar airspace opacification, grossly stable b/l effusions  ETOH use -1 beer and 3 shots of vodka a day -no evidence of withdrawal  Hypokalemia -4.4today. 40mEq of KDur BID, monitor, am labs  Hyponatremia - 1306/16, salt tabs BID,check BMET today   GEZ:MOQH VTE: SCD's, lovenox UT:MLYYT 06/08-06/13 WBC slightly down to 18, 500  Foley:none Follow up:TBD\ Home o 2 needs assessed Drain teaching      LOS: 13 days    Raymond Jacobs A Chiron Campione 04/12/2018

## 2018-04-12 NOTE — Care Management Note (Addendum)
Case Management Note  Patient Details  Name: Issiac Jamar MRN: [id number] Date of Birth: 1950-10-30  Subjective/Objective:                    Action/Plan:  Order received for home oxygen . Will need home oxygen ambulation saturation evaluation. Expected Discharge Date:                  Expected Discharge Plan:  Home/Self Care  In-House Referral:   Discharge planning Services  CM Consult  Post Acute Care Choice:  Durable Medical Equipment Choice offered to:     DME Arranged:  Oxygen DME Agency:  [place]:    [place] Agency:     Status of Service:  In process, will continue to follow  If discussed at Long Length of Stay Meetings, dates discussed:    Additional Comments:  Marilu Favre, RN 04/12/2018, 11:52 AM

## 2018-04-12 NOTE — Discharge Instructions (Signed)
Percutaneous Abscess Drain, Care After °This sheet gives you information about how to care for yourself after your procedure. Your health care provider may also give you more specific instructions. If you have problems or questions, contact your health care provider. °What can I expect after the procedure? °After your procedure, it is common to have: °· A small amount of bruising and discomfort in the area where the drainage tube (catheter) was placed. °· Sleepiness and fatigue. This should go away after the medicines you were given have worn off. ° °Follow these instructions at home: °Incision care °· Follow instructions from your health care provider about how to take care of your incision. Make sure you: °? Wash your hands with soap and water before you change your bandage (dressing). If soap and water are not available, use hand sanitizer. °? Change your dressing as told by your health care provider. °? Leave stitches (sutures), skin glue, or adhesive strips in place. These skin closures may need to stay in place for 2 weeks or longer. If adhesive strip edges start to loosen and curl up, you may trim the loose edges. Do not remove adhesive strips completely unless your health care provider tells you to do that. °· Check your incision area every day for signs of infection. Check for: °? More redness, swelling, or pain. °? More fluid or blood. °? Warmth. °? Pus or a bad smell. °? Fluid leaking from around your catheter (instead of fluid draining through your catheter). °Catheter care °· Follow instructions from your health care provider about emptying and cleaning your catheter and collection bag. You may need to clean the catheter every day so it does not clog. °· If directed, write down the following information every time you empty your bag: °? The date and time. °? The amount of drainage. °General instructions °· Rest at home for 1-2 days after your procedure. Return to your normal activities as told by your  health care provider. °· Do not take baths, swim, or use a hot tub for 24 hours after your procedure, or until your health care provider says that this is okay. °· Take over-the-counter and prescription medicines only as told by your health care provider. °· Keep all follow-up visits as told by your health care provider. This is important. °Contact a health care provider if: °· You have less than 10 mL of drainage a day for 2-3 days in a row, or as directed by your health care provider. °· You have more redness, swelling, or pain around your incision area. °· You have more fluid or blood coming from your incision area. °· Your incision area feels warm to the touch. °· You have pus or a bad smell coming from your incision area. °· You have fluid leaking from around your catheter (instead of through your catheter). °· You have a fever or chills. °· You have pain that does not get better with medicine. °Get help right away if: °· Your catheter comes out. °· You suddenly stop having drainage from your catheter. °· You suddenly have blood in the fluid that is draining from your catheter. °· You become dizzy or you faint. °· You develop a rash. °· You have nausea or vomiting. °· You have difficulty breathing or you feel short of breath. °· You develop chest pain. °· You have problems with your speech or vision. °· You have trouble balancing or moving your arms or legs. °Summary °· It is common to have a small   amount of bruising and discomfort in the area where the drainage tube (catheter) was placed. °· You may be directed to record the amount of drainage from the bag every time you empty it. °· Follow instructions from your health care provider about emptying and cleaning your catheter and collection bag. °This information is not intended to replace advice given to you by your health care provider. Make sure you discuss any questions you have with your health care provider. °Document Released: 02/24/2014 Document  Revised: 09/01/2016 Document Reviewed: 09/01/2016 °[employer]vier Interactive Patient Education © 2017 [employer] ° °

## 2018-04-12 NOTE — Progress Notes (Signed)
Chief Complaint: Patient was seen today for LUQ drain  Referring Physician(s): Dr. Brantley Stage  Supervising Physician: Sandi Mariscal  Patient Status: [facility] - In-pt  Subjective: S/p perc drain to LUQ abscess 6/18 Pt feeling much better. Tolerating some diet.  Objective: Physical Exam: BP 115/69 (BP Location: Right Arm)   Pulse 94   Temp 98.2 F (36.8 C) (Oral)   Resp 16   Ht 5\' 4"  (1.626 m)   Wt 130 lb 8.2 oz (59.2 kg)   SpO2 94%   BMI 22.40 kg/m  Drain intact, site clean, dry. Mildly tenderness Output cloudy with some debris   Current Facility-Administered Medications:  .  albuterol (PROVENTIL) (2.5 MG/3ML) 0.083% nebulizer solution 2.5 mg, 2.5 mg, Nebulization, Q4H PRN, Byrum, Rose Fillers, MD .  docusate sodium (COLACE) capsule 100 mg, 100 mg, Oral, BID, Saverio Danker, PA-C, 100 mg at 04/11/18 1053 .  feeding supplement (ENSURE ENLIVE) (ENSURE ENLIVE) liquid 237 mL, 237 mL, Oral, BID BM, Darel Hong D, NP, 237 mL at 04/12/18 0943 .  morphine 2 MG/ML injection 1-2 mg, 1-2 mg, Intravenous, Q4H PRN, Saverio Danker, PA-C .  multivitamin with minerals tablet 1 tablet, 1 tablet, Oral, Daily, Cornett, Thomas, MD, 1 tablet at 04/12/18 [phone] .  ondansetron (ZOFRAN-ODT) disintegrating tablet 4 mg, 4 mg, Oral, Q6H PRN **OR** ondansetron (ZOFRAN) injection 4 mg, 4 mg, Intravenous, Q6H PRN, Coralie Keens, MD .  oxyCODONE (Oxy IR/ROXICODONE) immediate release tablet 5-10 mg, 5-10 mg, Oral, Q4H PRN, Saverio Danker, PA-C, 5 mg at 04/12/18 0756 .  pantoprazole (PROTONIX) EC tablet 40 mg, 40 mg, Oral, Daily, Cornett, Thomas, MD, 40 mg at 04/12/18 0942 .  sodium chloride flush (NS) 0.9 % injection 5 mL, 5 mL, Intracatheter, Q8H, Greggory Keen, MD, 5 mL at 04/12/18 0636 .  sodium chloride tablet 1 g, 1 g, Oral, BID WC, Focht, Jessica L, PA, 1 g at 04/12/18 0756 .  traMADol (ULTRAM) tablet 50 mg, 50 mg, Oral, Q6H PRN, Saverio Danker, PA-C .  umeclidinium-vilanterol (ANORO ELLIPTA) 62.5-25  MCG/INH 1 puff, 1 puff, Inhalation, Daily, Collene Gobble, MD, 1 puff at 04/12/18 0931  Labs: CBC Recent Labs    04/11/18 0555 04/12/18 0942  WBC 18.5* 20.1*  HGB 12.9* 12.6*  HCT 38.2* 37.3*  PLT 899* 950*   BMET Recent Labs    04/11/18 0555 04/12/18 0942  NA 128* 125*  K 4.3 4.4  CL 95* 90*  CO2 26 25  GLUCOSE 102* 134*  BUN 16 12  CREATININE 0.34* 0.73  CALCIUM 8.7* 9.0   LFT Recent Labs    04/12/18 0942  PROT 6.7  ALBUMIN 2.4*  AST 19  ALT 25  ALKPHOS 180*  BILITOT 1.0   PT/INR Recent Labs    04/10/18 0608  LABPROT 13.7  INR 1.06     Studies/Results: Ct Image Guided Drainage By Percutaneous Catheter  Result Date: 04/10/2018 INDICATION: Left upper quadrant perisplenic abscess EXAM: CT GUIDED DRAINAGE OF LEFT UPPER QUADRANT PERISPLENIC ABSCESS MEDICATIONS: The patient is currently admitted to the hospital and receiving intravenous antibiotics. The antibiotics were administered within an appropriate time frame prior to the initiation of the procedure. ANESTHESIA/SEDATION: 1.0 mg IV Versed 50 mcg IV Fentanyl Moderate Sedation Time:  17 MINUTES The patient was continuously monitored during the procedure by the interventional radiology nurse under my direct supervision. COMPLICATIONS: None immediate. TECHNIQUE: Informed written consent was obtained from the patient after a thorough discussion of the procedural risks, benefits and alternatives. All questions  were addressed. Maximal Sterile Barrier Technique was utilized including caps, mask, sterile gowns, sterile gloves, sterile drape, hand hygiene and skin antiseptic. A timeout was performed prior to the initiation of the procedure. PROCEDURE: The left upper quadrant was prepped with ChloraPrep in a sterile fashion, and a sterile drape was applied covering the operative field. A sterile gown and sterile gloves were used for the procedure. Local anesthesia was provided with 1% Lidocaine. Previous imaging reviewed.  Patient position left anterior oblique. Noncontrast localization CT performed. The left upper quadrant perisplenic abscess was localized. Overlying skin marked for a lateral lower intercostal approach. Under sterile conditions and local anesthesia, an 18 gauge 10 cm access needle was advanced into the perisplenic abscess. Needle position confirmed with CT. Syringe aspiration yielded purulent fluid. Sample sent for culture. Amplatz guidewire inserted followed by tract dilatation to insert a 12 [place] drain. Drain catheter position confirmed with CT. 2 30 cc purulent fluid aspirated. Catheter secured with a Prolene suture and connected to external suction bulb. Sterile dressing applied. No immediate complication. Patient tolerated the procedure well. FINDINGS: Imaging confirms percutaneous needle access of the perisplenic abscess for drain insertion IMPRESSION: Successful CT-guided left upper quadrant perisplenic abscess drain placement Electronically Signed   By: Jerilynn Mages.  Shick M.D.   On: 04/10/2018 15:23    Assessment/Plan: Contained perforated duodenal ulcer with associated LUQ/perisplenic abscess Successful CT-guided left upper quadrant perisplenic abscess drain placement IR following Plan per [facility] Will likely DC with drain and can follow up in IR drain clinic.    LOS: 13 days   I spent a total of 20 minutes in face to face in clinical consultation, greater than 50% of which was counseling/coordinating care for LUQ abscess drain  Hakeem Frazzini PA-C 04/12/2018 11:14 AM

## 2018-04-12 NOTE — Care Management Note (Signed)
Case Management Note  Patient Details  Name: Raymond Jacobs MRN: [id number] Date of Birth: [birth year]  Subjective/Objective:                    Action/Plan:  Patient does not qualify for home oxygen.  Expected Discharge Date:                  Expected Discharge Plan:  Home/Self Care  In-House Referral:     Discharge planning Services  CM Consult  Post Acute Care Choice:  Durable Medical Equipment Choice offered to:  Patient  DME Arranged:  N/A DME Agency:  NA  HH Arranged:  RN [place] Agency:  [place]  Status of Service:  Completed, signed off  If discussed at [place] Point of Stay Meetings, dates discussed:    Additional Comments:  Marilu Favre, RN 04/12/2018, 3:47 PM

## 2018-04-12 NOTE — Progress Notes (Signed)
SATURATION QUALIFICATIONS: (This note is used to comply with regulatory documentation for home oxygen)  Patient Saturations on Room Air at Rest = 93%  Patient Saturations on Room Air while Ambulating = 90%  Of note: pt with HR to 121 with ambulation, able to walk and talk simultaneously with ease.

## 2018-04-12 NOTE — Evaluation (Signed)
Occupational Therapy Evaluation and Discharge Patient Details Name: Raymond Jacobs MRN: [id number] DOB: [birth year] Today's Date: 04/12/2018    History of Present Illness Pt is a 67 y/o male admitted secondary to abdominal pain who likely has a sealed perforation of the duodenum.  Incidentally radiological studies have shown a is usually dilated esophagus and compression of the bronchus intermedius with collapse of the right middle and right lower lobes. Pt with bilateral pleural effusions and severe emphysema. Pt with acute hypoxic respiratory failure with Residual L effusion on CT 6/16. Pt is s/p CT drain for abdominal abscess on 04/10/18. PMH including but not limited to stomach ulcer.   Clinical Impression   Pt is functioning independently. No OT needs    Follow Up Recommendations  No OT follow up    Equipment Recommendations  None recommended by OT    Recommendations for Other Services       Precautions / Restrictions Precautions Precautions: Fall Precaution Comments: L JP drain Restrictions Weight Bearing Restrictions: No      Mobility Bed Mobility Overal bed mobility: Modified Independent                Transfers Overall transfer level: Independent Equipment used: None                  Balance Overall balance assessment: No apparent balance deficits (not formally assessed)                                         ADL either performed or assessed with clinical judgement   ADL Overall ADL's : Independent                                       General ADL Comments: Educated in energy conservation strategies. Pt with elevated HR with ambulation, but maintaining 02 at 90% on RA.     Vision Baseline Vision/History: Wears glasses Wears Glasses: Reading only Patient Visual Report: No change from baseline       Perception     Praxis      Pertinent Vitals/Pain Pain Assessment: Faces Faces Pain Scale: Hurts a little  bit Pain Location: abdomen Pain Descriptors / Indicators: Sore Pain Intervention(s): Monitored during session     Hand Dominance Right   Extremity/Trunk Assessment Upper Extremity Assessment Upper Extremity Assessment: Overall WFL for tasks assessed   Lower Extremity Assessment Lower Extremity Assessment: Overall WFL for tasks assessed   Cervical / Trunk Assessment Cervical / Trunk Assessment: Normal   Communication Communication Communication: No difficulties   Cognition Arousal/Alertness: Awake/alert Behavior During Therapy: WFL for tasks assessed/performed Overall Cognitive Status: Within Functional Limits for tasks assessed                                     General Comments       Exercises     Shoulder Instructions      Home Living Family/patient expects to be discharged to:: Private residence Living Arrangements: Alone Available Help at Discharge: Friend(s);Available PRN/intermittently Type of Home: Apartment Home Access: Level entry     Home Layout: One level     Bathroom Shower/Tub: Teacher, early years/pre: Standard     Home Equipment: None  Prior Functioning/Environment Level of Independence: Independent                 OT Problem List:        OT Treatment/Interventions:      OT Goals(Current goals can be found in the care plan section) Acute Rehab OT Goals Patient Stated Goal: return home  OT Frequency:     Barriers to D/C:            Co-evaluation              AM-PAC PT "6 Clicks" Daily Activity     Outcome Measure Help from another person eating meals?: None Help from another person taking care of personal grooming?: None Help from another person toileting, which includes using toliet, bedpan, or urinal?: None Help from another person bathing (including washing, rinsing, drying)?: None Help from another person to put on and taking off regular upper body clothing?: None Help from  another person to put on and taking off regular lower body clothing?: None 6 Click Score: 24   End of Session    Activity Tolerance: Patient tolerated treatment well Patient left: in bed;with call bell/phone within reach  OT Visit Diagnosis: Pain                Time: 4383-8184 OT Time Calculation (min): 22 min Charges:  OT General Charges $OT Visit: 1 Visit OT Evaluation $OT Eval Low Complexity: 1 Low G-Codes:     16-Apr-2018 Nestor Lewandowsky, OTR/L Pager: [name], [name] 2018/04/16, 3:40 PM

## 2018-04-12 NOTE — Telephone Encounter (Signed)
Still admitted as of today.  
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
## 2018-04-12 NOTE — Progress Notes (Signed)
CRITICAL VALUE ALERT  Critical Value: Platelets - 950  Date & Time Notied:  04/12/2018 @ 1036  Provider Notified: Brigid Re, PA  Orders Received/Actions taken: N/A  Yvonna Alanis

## 2018-04-13 LAB — PATHOLOGIST SMEAR REVIEW

## 2018-04-13 MED ORDER — ALBUTEROL SULFATE (2.5 MG/3ML) 0.083% IN NEBU: 2.5000 mg | INHALATION_SOLUTION | RESPIRATORY_TRACT | 2 refills | Status: DC | PRN

## 2018-04-13 MED ORDER — PANTOPRAZOLE SODIUM 40 MG PO TBEC: 40.0000 mg | DELAYED_RELEASE_TABLET | Freq: Every day | ORAL | 3 refills | Status: DC

## 2018-04-13 MED ORDER — UMECLIDINIUM-VILANTEROL 62.5-25 MCG/INH IN AEPB: 1.0000 | INHALATION_SPRAY | Freq: Every day | RESPIRATORY_TRACT | 2 refills | Status: DC

## 2018-04-13 MED ORDER — OXYCODONE HCL 5 MG PO TABS: 5.0000 mg | ORAL_TABLET | ORAL | 0 refills | Status: DC | PRN

## 2018-04-13 NOTE — Care Management Important Message (Signed)
Important Message  Patient Details  Name: Raymond Jacobs MRN: [id number] Date of Birth: 1951-08-01   Medicare Important Message Given:  Yes    Emilyn Ruble P Antawn Sison 04/13/2018, 12:52 PM

## 2018-04-13 NOTE — Progress Notes (Signed)
Pt for discharge today going home with 02 tank, given health teachings, due meds, next appointment, given his all his personal belongings, discontinue peripheral IV, no complain of pain at this time.

## 2018-04-13 NOTE — Progress Notes (Addendum)
SATURATION QUALIFICATIONS: (This note is used to comply with regulatory documentation for home oxygen)  Patient Saturations on Room Air at Rest = 90%  Patient Saturations on Room Air while Ambulating =84%  Patient Saturations on 2Liters of oxygen while Ambulating = 91% Please briefly explain why patient needs home oxygen:

## 2018-04-13 NOTE — Telephone Encounter (Signed)
Still admitted as of today 04/13/18

## 2018-04-13 NOTE — Progress Notes (Signed)
Left message with IR drain clinic re: follow-up appointment.  Patient will hear from schedulers with time and date of appointment.   Brynda Greathouse, MS RD PA-C 3:24 PM

## 2018-04-13 NOTE — Care Management Note (Signed)
Case Management Note  Patient Details  Name: Urias Sheek MRN: [id number] Date of Birth: 1951/03/21  Subjective/Objective:                    Action/Plan:  See new home oxygen saturation qualifying note. Home oxygen ordered through [facility]. [facility] will bring a portable tank to patient's room today before discharge. Patient aware . Patient has transportation home.  Expected Discharge Date:  04/13/18               Expected Discharge Plan:  Home/Self Care  In-House Referral:     Discharge planning Services  CM Consult  Post Acute Care Choice:  Durable Medical Equipment Choice offered to:  Patient  DME Arranged:  Oxygen DME Agency:  NA, [place]:  RN [facility] Agency:  [place]  Status of Service:  Completed, signed off  If discussed at [place] of Stay Meetings, dates discussed:    Additional Comments:  Marilu Favre, RN 04/13/2018, 11:15 AM

## 2018-04-15 LAB — AEROBIC/ANAEROBIC CULTURE (SURGICAL/DEEP WOUND)

## 2018-04-15 LAB — AEROBIC/ANAEROBIC CULTURE W GRAM STAIN (SURGICAL/DEEP WOUND)

## 2018-04-16 ENCOUNTER — Other Ambulatory Visit: Payer: Self-pay | Admitting: General Surgery

## 2018-04-16 DIAGNOSIS — K651 Peritoneal abscess: Secondary | ICD-10-CM

## 2018-04-17 NOTE — Telephone Encounter (Signed)
Pt is scheduled to see Dr. [name] on 05/09/18 at 3 pm for H/F. Will close encounter

## 2018-04-24 ENCOUNTER — Ambulatory Visit
Admission: RE | Admit: 2018-04-24 | Discharge: 2018-04-24 | Disposition: A | Discharge status: Home / Self Care | Payer: Medicare Other | Source: Ambulatory Visit | Attending: General Surgery | Admitting: General Surgery

## 2018-04-24 ENCOUNTER — Encounter: Payer: Self-pay | Admitting: Radiology

## 2018-04-24 DIAGNOSIS — K651 Peritoneal abscess: Secondary | ICD-10-CM

## 2018-04-24 HISTORY — PX: IR RADIOLOGIST EVAL & MGMT: IMG5224

## 2018-04-24 MED ORDER — IOHEXOL 300 MG/ML  SOLN
100.0000 mL | Freq: Once | INTRAMUSCULAR | Status: AC | PRN
  Administered 2018-04-24: 100 mL via INTRAVENOUS

## 2018-04-24 NOTE — Progress Notes (Signed)
Chief Complaint: History of perforated duodenal ulcer and status post percutaneous catheter drainage of perisplenic abscess on 04/10/2018.  Referring Physician(s): Mickeal Skinner  History of Present Illness: Raymond Jacobs is a 67 y.o. male recently admitted with a presumed perforated duodenal ulcer and associated large amount of fluid in the peritoneal cavity including a more focal fluid collection adjacent to the spleen which was drained by CT-guided catheter placement on 04/10/2018 by Dr. Annamaria Boots.  After discharge from the hospital he has done well and states that he is back to a regular diet and full activity.  He has no abdominal pain and denies fever.  Drainage from the perisplenic drainage catheter has been minimal to none over the last several days.  Past Medical History:  Diagnosis Date  . Stomach ulcer     Past Surgical History:  Procedure Laterality Date  . IR RADIOLOGIST EVAL & MGMT  04/24/2018    Allergies: Asa [aspirin] and Other  Medications: Prior to Admission medications   Medication Sig Start Date End Date Taking? Authorizing Provider  acetaminophen (TYLENOL) 500 MG tablet Take 500 mg by mouth every 6 (six) hours as needed (for pain).    Yes [provider]  B Complex-C-Folic Acid (SUPER B-COMPLEX/VIT C/FA) TABS Take 1 tablet by mouth 2 (two) times a week.   Yes [provider]  Cyanocobalamin (VITAMIN B 12 PO) Take 1 tablet by mouth 2 (two) times a week.    Yes [provider]  ECHINACEA PO Take 2 capsules by mouth 2 (two) times a week.   Yes [provider]  Omega-3 Fatty Acids (FISH OIL PO) Take 1 capsule by mouth 2 (two) times a week.    Yes [provider]  umeclidinium-vilanterol (ANORO ELLIPTA) 62.5-25 MCG/INH AEPB Inhale 1 puff into the lungs daily. 04/14/18  Yes Rayburn, Claiborne Billings A, PA-C  VITAMIN E PO Take 1 capsule by mouth 2 (two) times a week.   Yes [provider]  albuterol (PROVENTIL) (2.5  MG/3ML) 0.083% nebulizer solution Take 3 mLs (2.5 mg total) by nebulization every 4 (four) hours as needed for wheezing or shortness of breath. Patient not taking: Reported on 04/24/2018 04/13/18   Rayburn, Claiborne Billings A, PA-C  clobetasol ointment (TEMOVATE) 0.05 % Apply topically 2 (two) times daily. Patient not taking: Reported on 03/30/2018 05/16/13   Janne Napoleon, NP  lanolin ointment Apply topically as needed for dry skin. Patient not taking: Reported on 03/30/2018 05/16/13   Janne Napoleon, NP  oxyCODONE (OXY IR/ROXICODONE) 5 MG immediate release tablet Take 1-2 tablets (5-10 mg total) by mouth every 4 (four) hours as needed for moderate pain. Patient not taking: Reported on 04/24/2018 04/13/18   Rayburn, Claiborne Billings A, PA-C  pantoprazole (PROTONIX) 40 MG tablet Take 1 tablet (40 mg total) by mouth daily. Patient not taking: Reported on 04/24/2018 04/13/18   Rayburn, Floyce Stakes, PA-C     Family History  Problem Relation Age of Onset  . Colon cancer Brother   . Diabetes Brother   . Diabetes Maternal Aunt     Social History   Socioeconomic History  . Marital status: Divorced    Spouse name: Not on file  . Number of children: Not on file  . Years of education: Not on file  . Highest education level: Not on file  Occupational History  . Not on file  Social Needs  . Financial resource strain: Not on file  . Food insecurity:    Worry: Not on file  Inability: Not on file  . Transportation needs:    Medical: Not on file    Non-medical: Not on file  Tobacco Use  . Smoking status: Current Every Day Smoker    Packs/day: 1.00    Types: Cigarettes  . Smokeless tobacco: Never Used  . Tobacco comment: form given 08-15-12  Substance and Sexual Activity  . Alcohol use: Yes    Comment: 1 beer and 3 shots vodka daily  . Drug use: Yes    Types: Marijuana  . Sexual activity: Not on file  Lifestyle  . Physical activity:    Days per week: Not on file    Minutes per session: Not on file  . Stress: Not on file    Relationships  . Social connections:    Talks on phone: Not on file    Gets together: Not on file    Attends religious service: Not on file    Active member of club or organization: Not on file    Attends meetings of clubs or organizations: Not on file    Relationship status: Not on file  Other Topics Concern  . Not on file  Social History Narrative  . Not on file    Review of Systems: A 12 point ROS discussed and pertinent positives are indicated in the HPI above.  All other systems are negative.  Review of Systems  Constitutional: Negative.   Respiratory: Negative.   Cardiovascular: Negative.   Gastrointestinal: Negative.   Genitourinary: Negative.   Musculoskeletal: Negative.   Neurological: Negative.     Vital Signs: BP 112/67   Pulse 80   Temp 97.8 F (36.6 C) (Oral)   Resp 14   Ht 5' 4.5" (1.638 m)   Wt 118 lb (53.5 kg)   SpO2 100%   BMI 19.94 kg/m   Physical Exam  Constitutional: He is oriented to person, place, and time. No distress.  Abdominal: Soft. He exhibits no distension. There is no tenderness. There is no rebound and no guarding.  Drain exit site clean and dry with slight erythema at catheter exit site.   Neurological: He is alert and oriented to person, place, and time.  Skin: He is not diaphoretic.  Vitals reviewed.   Imaging: Dg Chest 1 View  Result Date: 03/30/2018 CLINICAL DATA:  Cough. EXAM: CHEST  1 VIEW COMPARISON:  Abdominal series with frontal chest x-ray from same day. Chest x-ray dated July 13, 2012. FINDINGS: Single lateral view demonstrates bibasilar atelectasis/scarring with patchy airspace disease in the right lower lobe. No pleural effusion. IMPRESSION: Bibasilar atelectasis/scarring with superimposed right lower lobe pneumonia. Electronically Signed   By: Titus Dubin M.D.   On: 03/30/2018 17:18   Dg Chest 2 View  Result Date: 04/09/2018 CLINICAL DATA:  Shortness of breath, right lower lobe pneumonia EXAM: CHEST - 2 VIEW  COMPARISON:  Portable chest x-ray of April 08, 2018 FINDINGS: There is persistent volume loss on the right. There is a small right pleural effusion. Dense infiltrate is present in the lower lobe. On the left there is less prominent basilar density consistent with atelectasis or pneumonia and a small effusion. There is no mediastinal shift. There is no pneumothorax or pneumomediastinum. The heart and pulmonary vascularity are normal. There is calcification in the wall of the aortic arch. IMPRESSION: Bibasilar atelectasis or pneumonia with small bilateral pleural effusions greatest on the right. When compared to yesterday's study there may have been slight interval improvement. Thoracic aortic atherosclerosis.  Are Electronically Signed  By: Dejion  Martinique M.D.   On: 04/09/2018 07:29   Dg Chest 2 View  Result Date: 03/31/2018 CLINICAL DATA:  Acute respiratory failure. Worsening shortness of breath for 1 week. Achalasia. EXAM: CHEST - 2 VIEW COMPARISON:  03/30/2018 FINDINGS: Increased infiltrates are seen in both lung bases, right side greater than left. Small bilateral pleural effusions are also noted. Low lung volumes are seen. Heart size is stable. Aortic atherosclerosis. IMPRESSION: Increased bibasilar infiltrates and small bilateral pleural effusions. Electronically Signed   By: Earle Gell M.D.   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 Evaluate for fluid collection on the right side of the chest. 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 Concern for a perforated duodenal ulcer on recent abdominal CT. EXAM: CT CHEST AND ABDOMEN WITH CONTRAST TECHNIQUE: Multidetector CT imaging of the chest and abdomen was performed following the standard protocol during bolus administration of intravenous contrast. CONTRAST:  180mL OMNIPAQUE IOHEXOL 300 MG/ML  SOLN COMPARISON:  CT of the abdomen pelvis on 03/30/2018 FINDINGS: CT  CHEST FINDINGS Cardiovascular: Coronary artery calcifications. Normal caliber of the thoracic aorta. Motion artifact at the ascending thoracic aorta limits evaluation for dissection. An acute aortic process is thought to be unlikely. The central pulmonary arteries are patent. No significant pericardial fluid. Mediastinum/Nodes: The previously described fluid collection in the right lower chest is associated with a massively dilated esophagus. There appears to be dilute oral contrast within this diffusely dilated esophagus. There is a transition point at the GE junction and findings are suggestive for achalasia. No lymph node enlargement in the chest. Lungs/Pleura: The trachea and mainstem bronchi are patent but there is airway obstruction involving the bronchus intermedius with collapse of the right middle lobe and right lower lobe. Patient has diffuse centrilobular emphysema and most prominent at the right lung apex. Presumed scarring at the right lung apex. Small left pleural effusion with volume loss and consolidation in left lower lobe. 3 mm peripheral nodule in the left upper lobe on sequence 5, image 28 and sequence 6, image 35. Musculoskeletal: Unremarkable. CT ABDOMEN FINDINGS Hepatobiliary: Increased perihepatic ascites. Normal appearance of the liver without a suspicious lesion. No specific findings for cirrhosis. Portal venous system is patent. High-density material in the gallbladder is suggestive for vicarious excretion of the intravenous contrast probably related to the recent CT on 03/30/2018. No biliary dilatation. Pancreas: Unremarkable. No pancreatic ductal dilatation or surrounding inflammatory changes. Spleen: Normal in size without focal abnormality. Adrenals/Urinary Tract: Normal appearance of the adrenal glands. Normal appearance of both kidneys without hydronephrosis. Stomach/Bowel: Again noted is fluid and concern for inflammation in the porta hepatis region. Evidence for an ulceration and  possible contained rupture involving the duodenal bulb. This area is difficult to characterize due to the fluid and inflammation in this area. This ulceration or contained perforation is best seen on the delayed images, sequence 8, image 22. Difficult to exclude a tiny focus of extraluminal gas in this area but no significant free air. No significant small bowel dilatation. Visualized colon is unremarkable. Vascular/Lymphatic: Atherosclerotic calcifications involving the abdominal aorta and proximal iliac arteries. Negative for an aortic aneurysm or dissection. Main visceral arteries are patent. Other: Again noted is intra-abdominal ascites. Increased fluid around the liver. There continues to be fluid in left upper quadrant around the spleen and fluid in the upper abdomen between the stomach and transverse colon. Musculoskeletal: Significant disc space narrowing and  endplate disease in the lumbar spine. IMPRESSION: 1. The large fluid-filled structure in the right lower chest represents a massively dilated esophagus. Findings are most compatible with achalasia based on the imaging findings and previous endoscopy report. 2. Airway obstruction involving the bronchus intermedius with collapse of the right lower lobe and right middle lobe. Findings could be associated with aspiration but an endobronchial lesion cannot be excluded. Small left pleural effusion with volume loss and consolidation in the left lower lobe. 3. Abnormal appearance of the duodenal bulb. Findings are concerning for an ulceration and/or contained perforation in this area. 4. Intra-abdominal ascites.  Increased ascites around the liver. 5. Centrilobular emphysema with 3 mm nodule in the left upper lobe. No follow-up needed if patient is low-risk. Non-contrast chest CT can be considered in 12 months if patient is high-risk. This recommendation follows the consensus statement: Guidelines for Management of Incidental Pulmonary Nodules Detected on CT  Images: From the Fleischner Society 2017; Radiology 2017; 284:228-243. Electronically Signed   By: Markus Daft M.D.   On: 03/31/2018 10:09   Ct Angio Chest Pe W Or Wo Contrast  Result Date: 04/03/2018 CLINICAL DATA:  Shortness of breath, smoker EXAM: CT ANGIOGRAPHY CHEST WITH CONTRAST TECHNIQUE: Multidetector CT imaging of the chest was performed using the standard protocol during bolus administration of intravenous contrast. Multiplanar CT image reconstructions and MIPs were obtained to evaluate the vascular anatomy. CONTRAST:  124mL ISOVUE-370 IOPAMIDOL (ISOVUE-370) INJECTION 76% IV COMPARISON:  03/31/2018 FINDINGS: Cardiovascular: Aorta normal caliber with scattered atherosclerotic calcifications. Additional atherosclerotic calcifications at proximal great vessels and coronary arteries. No aortic aneurysm or dissection. No pericardial effusion. Pulmonary arteries well opacified and patent. No evidence of pulmonary embolism. Mediastinum/Nodes: Massive dilatation of the esophagus by air in fluid with abrupt distal tapering favoring achalasia. No thoracic adenopathy. Base of cervical region normal appearance. Lungs/Pleura: Moderate-sized BILATERAL pleural effusions. Complete atelectasis of RIGHT middle and RIGHT lower lobes. Subtotal atelectasis of remaining lobes. Bullous emphysematous changes at lung apices greater on RIGHT. Scattered upper lobe infiltrates. No pneumothorax. Upper Abdomen: Ascites.  Otherwise unremarkable. Musculoskeletal: No acute osseous findings. Review of the MIP images confirms the above findings. IMPRESSION: No evidence pulmonary embolism. BILATERAL moderate pleural effusions with complete atelectasis of RIGHT middle and RIGHT lower lobes as well as significant atelectasis of LEFT lower lobe and to lesser degrees posterior upper lobes. Upper lobe infiltrates especially on LEFT consistent with pneumonia common new since previous exam. Underlying bullous emphysematous changes. Marked  dilatation of the esophagus filled with air and fluid associated with abrupt distal tapering favoring achalasia. Aortic Atherosclerosis (ICD10-I70.0) and Emphysema (ICD10-J43.9). Electronically Signed   By: Lavonia Dana M.D.   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 Evaluate for fluid collection on the right side of the chest. 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 Concern for a perforated duodenal ulcer on recent abdominal CT. EXAM: CT CHEST AND ABDOMEN WITH CONTRAST TECHNIQUE: Multidetector CT imaging of the chest and abdomen was performed following the standard protocol during bolus administration of intravenous contrast. CONTRAST:  13mL OMNIPAQUE IOHEXOL 300 MG/ML  SOLN COMPARISON:  CT of the abdomen pelvis on 03/30/2018 FINDINGS: CT CHEST FINDINGS Cardiovascular: Coronary artery calcifications. Normal caliber of the thoracic aorta. Motion artifact at the ascending thoracic aorta limits evaluation for dissection. An acute aortic process is thought to be unlikely. The central pulmonary arteries are patent. No significant pericardial fluid. Mediastinum/Nodes: The  previously described fluid collection in the right lower chest is associated with a massively dilated esophagus. There appears to be dilute oral contrast within this diffusely dilated esophagus. There is a transition point at the GE junction and findings are suggestive for achalasia. No lymph node enlargement in the chest. Lungs/Pleura: The trachea and mainstem bronchi are patent but there is airway obstruction involving the bronchus intermedius with collapse of the right middle lobe and right lower lobe. Patient has diffuse centrilobular emphysema and most prominent at the right lung apex. Presumed scarring at the right lung apex. Small left pleural effusion with volume loss and consolidation in left lower lobe. 3 mm peripheral nodule in the left  upper lobe on sequence 5, image 28 and sequence 6, image 35. Musculoskeletal: Unremarkable. CT ABDOMEN FINDINGS Hepatobiliary: Increased perihepatic ascites. Normal appearance of the liver without a suspicious lesion. No specific findings for cirrhosis. Portal venous system is patent. High-density material in the gallbladder is suggestive for vicarious excretion of the intravenous contrast probably related to the recent CT on 03/30/2018. No biliary dilatation. Pancreas: Unremarkable. No pancreatic ductal dilatation or surrounding inflammatory changes. Spleen: Normal in size without focal abnormality. Adrenals/Urinary Tract: Normal appearance of the adrenal glands. Normal appearance of both kidneys without hydronephrosis. Stomach/Bowel: Again noted is fluid and concern for inflammation in the porta hepatis region. Evidence for an ulceration and possible contained rupture involving the duodenal bulb. This area is difficult to characterize due to the fluid and inflammation in this area. This ulceration or contained perforation is best seen on the delayed images, sequence 8, image 22. Difficult to exclude a tiny focus of extraluminal gas in this area but no significant free air. No significant small bowel dilatation. Visualized colon is unremarkable. Vascular/Lymphatic: Atherosclerotic calcifications involving the abdominal aorta and proximal iliac arteries. Negative for an aortic aneurysm or dissection. Main visceral arteries are patent. Other: Again noted is intra-abdominal ascites. Increased fluid around the liver. There continues to be fluid in left upper quadrant around the spleen and fluid in the upper abdomen between the stomach and transverse colon. Musculoskeletal: Significant disc space narrowing and endplate disease in the lumbar spine. IMPRESSION: 1. The large fluid-filled structure in the right lower chest represents a massively dilated esophagus. Findings are most compatible with achalasia based on the  imaging findings and previous endoscopy report. 2. Airway obstruction involving the bronchus intermedius with collapse of the right lower lobe and right middle lobe. Findings could be associated with aspiration but an endobronchial lesion cannot be excluded. Small left pleural effusion with volume loss and consolidation in the left lower lobe. 3. Abnormal appearance of the duodenal bulb. Findings are concerning for an ulceration and/or contained perforation in this area. 4. Intra-abdominal ascites.  Increased ascites around the liver. 5. Centrilobular emphysema with 3 mm nodule in the left upper lobe. No follow-up needed if patient is low-risk. Non-contrast chest CT can be considered in 12 months if patient is high-risk. This recommendation follows the consensus statement: Guidelines for Management of Incidental Pulmonary Nodules Detected on CT Images: From the Fleischner Society 2017; Radiology 2017; [id number]. Electronically Signed   By: Markus Daft M.D.   On: 03/31/2018 10:09   Ct Abdomen Pelvis W Contrast  Result Date: 04/24/2018 CLINICAL DATA:  History of perforated duodenal ulcer and status post percutaneous catheter drainage of large perisplenic abscess fluid collection on 04/10/2018. Further evaluation with now no significant fluid output from the drainage catheter. EXAM: CT ABDOMEN AND PELVIS WITH CONTRAST TECHNIQUE: Multidetector CT  imaging of the abdomen and pelvis was performed using the standard protocol following bolus administration of intravenous contrast. CONTRAST:  172mL OMNIPAQUE IOHEXOL 300 MG/ML  SOLN COMPARISON:  04/08/2018 FINDINGS: Lower chest: Stable appearance of dilated esophagus in the right posterior hemithorax. Stable adjacent atelectasis of the right lower lung. Improved aeration of the left lower lobe with some residual mild atelectasis. Trace amount of posterior right pleural fluid. Hepatobiliary: No focal liver abnormality is seen. No gallstones, gallbladder wall thickening, or  biliary dilatation. Diminished amount of perihepatic fluid with smaller amount of lateral perihepatic fluid remaining. Pancreas: Unremarkable. No pancreatic ductal dilatation or surrounding inflammatory changes. Spleen: Normal in size without focal abnormality. Perisplenic drain remains in place adjacent to the superior and anterior spleen. The previously noted large perisplenic abscess has largely resolved with a small amount of fluid remaining adjacent to the pigtail portion of the drainage catheter. Adrenals/Urinary Tract: Adrenal glands are unremarkable. Kidneys are normal, without renal calculi, focal lesion, or hydronephrosis. Bladder is unremarkable. Stomach/Bowel: Bowel shows no evidence of obstruction, ileus or inflammation. No free air identified. Vascular/Lymphatic: No significant vascular findings are present. No enlarged abdominal or pelvic lymph nodes. Reproductive: Prostate is unremarkable. Other: No abdominal wall hernia or abnormality. Musculoskeletal: No acute or significant osseous findings. IMPRESSION: Largely resolved perisplenic abscess with small amount of residual fluid around the pigtail portion of the perisplenic drainage catheter. Perihepatic fluid also shows significant diminishment with small amount of lateral perihepatic fluid remaining. Given no significant output from the percutaneous drainage catheter, the drain was removed following the CT. Please see separately dictated clinic note in Epic. Electronically Signed   By: Aletta Edouard M.D.   On: 04/24/2018 10:41   Ct Abdomen Pelvis W Contrast  Result Date: 04/08/2018 CLINICAL DATA:  Follow-up duodenal ulcer. EXAM: CT ABDOMEN AND PELVIS WITH CONTRAST TECHNIQUE: Multidetector CT imaging of the abdomen and pelvis was performed using the standard protocol following bolus administration of intravenous contrast. CONTRAST:  126mL OMNIPAQUE IOHEXOL 300 MG/ML  SOLN COMPARISON:  CT abdomen 03/31/18 and CT abdomen pelvis 03/30/2018 FINDINGS:  Lower chest: Small left pleural effusion is identified, similar to previous exam. Bilateral lower lobe atelectasis and consolidation is identified. Large hiatal hernia again noted. Moderate changes of emphysema identified. Aortic atherosclerosis noted. There are calcifications noted within the LAD and left circumflex coronary arteries. Hepatobiliary: There is no focal liver abnormality identified. Gallbladder is unremarkable. No biliary dilatation. Pancreas: Normal appearance of the pancreas. Spleen: No focal splenic abnormality. Adrenals/Urinary Tract: The adrenal glands are normal. No kidney mass or hydronephrosis identified bilaterally. The urinary bladder appears normal. Stomach/Bowel: No abnormal distension of the stomach. Previously identified suspected duodenal ulcer is again noted involving the bulb, image 27/6. This contains a small focus of gas. No abnormal dilatation of the small bowel loops. No abnormal wall thickening or inflammation. No pathologic dilatation of the colon. Vascular/Lymphatic: Aortic atherosclerosis. No aneurysm. The portal vein and hepatic veins appear patent. Of thank you back atrophy no abdominal or pelvic adenopathy. No inguinal adenopathy. Reproductive: Prostate gland enlargement. Other: There is a large loculated fluid collection within the upper abdomen which extends to cover both the liver and spleen. In contrast to the previous exam this now has a well defined enhancing border. Further there has been progressive mass effect upon the spleen with caudal displacement of the spleen with respect to the left hemidiaphragm. These may be amendable to percutaneous drainage under image guidance. A small fluid collection measuring 2 cm is identified adjacent to  the gastric antrum, image 44/3. No significant ascites or fluid collections within the abdomen or pelvis. No free intraperitoneal air noted at this time. 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  IMPRESSION: 1. Interval loculation of previous upper abdominal fluid which now covers both the liver and spleen and exerts mass effect upon the spleen. This may be amendable to percutaneous drainage. 2. Small duodenal bulb ulcer is less conspicuous than on the previous exam. 3. Bilateral lower lobe airspace consolidation in atelectasis. 4. Small left pleural effusion 5. Large hiatal hernia 6. Aortic atherosclerosis and LAD/left circumflex coronary artery atherosclerotic calcifications. Aortic Atherosclerosis (ICD10-I70.0). Electronically Signed   By: Kerby Moors M.D.   On: 04/08/2018 14:40   Ct Abdomen Pelvis W Contrast  Addendum Date: 03/30/2018   ADDENDUM REPORT: 03/30/2018 22:03 ADDENDUM: Critical Value/emergent results were called by telephone at the time of interpretation on 03/30/2018 at 10:00 pm to Dr. Nanda Quinton , who verbally acknowledged these results. Electronically Signed   By: Elon Alas M.D.   On: 03/30/2018 22:03   Result Date: 03/30/2018 CLINICAL DATA:  Generalized abdominal pain for 2 days. History of stomach ulcer. EXAM: CT ABDOMEN AND PELVIS WITH CONTRAST TECHNIQUE: Multidetector CT imaging of the abdomen and pelvis was performed using the standard protocol following bolus administration of intravenous contrast. CONTRAST:  161mL OMNIPAQUE IOHEXOL 300 MG/ML  SOLN COMPARISON:  None. FINDINGS: LOWER CHEST: Partially imaged 8.1 x 5.1 cm rim enhancing fluid collection RIGHT lung base with adjacent consolidation with air bronchograms. Mild RIGHT middle lobe and LEFT lower lobe atelectasis versus pneumonia. HEPATOBILIARY: Liver and gallbladder are normal. PANCREAS: Normal. SPLEEN: Normal. ADRENALS/URINARY TRACT: Kidneys are orthotopic, demonstrating symmetric enhancement. No nephrolithiasis, hydronephrosis or solid renal masses. The unopacified ureters are normal in course and caliber. Delayed imaging through the kidneys demonstrates symmetric prompt contrast excretion within the proximal  urinary collecting system. Urinary bladder is partially distended with layering densities on delayed phase. Normal adrenal glands. STOMACH/BOWEL: Apparent dehiscent proximal duodenum concerning for ulcer tracking to through the serosal surface with potential punctate pneumoperitoneum (coronal image 26 and 27. Mild hyperemic small-bowel wall thickening. Mild colonic wall thickening, bowel is overall normal in course and caliber. Mildly edematous appendix similar to degree of breast:. VASCULAR/LYMPHATIC: Aortoiliac vessels are normal in course and caliber. Moderate calcific atherosclerosis. No lymphadenopathy by CT size criteria. REPRODUCTIVE: Normal. OTHER: Moderate amount of low-density free fluid without rim enhancing fluid collections. Mild mesenteric edema. MUSCULOSKELETAL: Nonacute.  Severe L4-5 and L5-S1 degenerative disc. IMPRESSION: 1. 8.1 x 5.1 cm RIGHT lung base fluid collection, this is likely pleural-based seen with empyema, atypical appearing hiatal hernia or patulous esophagus/achalasia. RIGHT potentially LEFT lung base pneumonia. Recommend CT chest with contrast and concurrent CT esophagram. 2. Probable perforated duodenal ulcer the punctate pneumoperitoneum. Moderate volume ascites. 3. Enterocolitis, presumably reactive. 4. Layering density in the urinary bladder concerning for calcification, less likely contrast. Electronically Signed: By: Elon Alas M.D. On: 03/30/2018 22:00   Dg Chest Port 1 View  Result Date: 04/08/2018 CLINICAL DATA:  Acute respiratory failure with hypoxia. EXAM: PORTABLE CHEST 1 VIEW COMPARISON:  1 day prior FINDINGS: Midline trachea. Normal heart size. Atherosclerosis in the transverse aorta. Small bilateral pleural effusions persist. No pneumothorax. Right greater than left base airspace disease is not significantly changed. Numerous leads and wires project over the chest. IMPRESSION: No significant change since one day prior. Bilateral pleural effusions with right  greater than left base airspace disease. On the left, most likely atelectasis. At  the right lung base, infection or aspiration could look similar. Electronically Signed   By: Abigail Miyamoto M.D.   On: 04/08/2018 07:19   Dg Chest Port 1 View  Result Date: 04/07/2018 CLINICAL DATA:  Respiratory failure. EXAM: PORTABLE CHEST 1 VIEW COMPARISON:  04/06/2018 and CT chest 04/03/2018. FINDINGS: Trachea is midline. Heart size stable. Thoracic aorta is calcified. Mild bibasilar airspace opacification persists with improvement in aeration from yesterday's exam. Small bilateral pleural effusions persist. IMPRESSION: 1. Improving bibasilar airspace opacification, which may be due to atelectasis and/or pneumonia. 2. Small bilateral pleural effusions, grossly stable. Electronically Signed   By: Lorin Picket M.D.   On: 04/07/2018 07:21   Dg Chest Port 1 View  Result Date: 04/06/2018 CLINICAL DATA:  Acute respiratory failure EXAM: PORTABLE CHEST 1 VIEW COMPARISON:  04/05/2018 FINDINGS: Cardiac shadow is stable. Patchy bilateral infiltrates are noted with small effusions. The overall appearance given some patient positioning differences is stable. No new focal infiltrate is seen. No bony abnormality is noted. IMPRESSION: Stable bilateral effusions and bibasilar infiltrates. Electronically Signed   By: Inez Catalina M.D.   On: 04/06/2018 06:36   Dg Chest Port 1 View  Result Date: 04/05/2018 CLINICAL DATA:  Respiratory failure EXAM: PORTABLE CHEST 1 VIEW COMPARISON:  04/03/2018 FINDINGS: NG tube has been removed. Small bilateral pleural effusions. Bilateral mid and lower lung airspace opacities, not significantly changed. Heart appears normal size. Dilated air and fluid-filled esophagus again noted. IMPRESSION: Moderate bilateral effusions with bilateral perihilar and lower lobe airspace opacities which could represent atelectasis, pneumonia or edema. No real change. Electronically Signed   By: Rolm Baptise M.D.   On:  04/05/2018 07:15   Dg Chest Port 1 View  Result Date: 04/03/2018 CLINICAL DATA:  Shortness of breath. Atelectasis. Perforated duodenal ulcer. EXAM: PORTABLE CHEST 1 VIEW COMPARISON:  03/31/2018 FINDINGS: There has been placement of a nasogastric tube, which is looped within a large hiatal hernia with distal tip near the GE junction. Moderate bilateral pleural effusions and bibasilar atelectasis or consolidation shows no significant change. Heart size is stable. No pneumothorax visualized. IMPRESSION: New nasogastric tube is looped within a large hiatal hernia, with distal tip near the GE junction. No significant change in moderate bilateral pleural effusions and bibasilar atelectasis or consolidation. Electronically Signed   By: Earle Gell M.D.   On: 04/03/2018 08:57   Dg Abd Acute W/chest  Result Date: 03/30/2018 CLINICAL DATA:  Shortness of breath and abdominal pain EXAM: DG ABDOMEN ACUTE W/ 1V CHEST COMPARISON:  Chest radiograph and abdominal radiograph July 13, 2012 FINDINGS: PA chest: There is atelectatic change in lung bases with patchy consolidation in the right base. The lungs elsewhere are clear. Heart size and pulmonary vascularity are normal. No adenopathy. There is aortic atherosclerosis. Supine and upright abdomen: There is moderate stool throughout the colon. There is no bowel dilatation or air-fluid level to suggest bowel obstruction. No free air. Stomach appears filled with fluid. There is degenerative change in the lumbar spine. IMPRESSION: Bibasilar atelectasis with probable early pneumonia right base. No bowel obstruction or free air.  Fluid-filled stomach evident. Electronically Signed   By: Lowella Grip III M.D.   On: 03/30/2018 13:56   Dg Addison Bailey G Tube Plc W/fl W/rad  Result Date: 04/01/2018 CLINICAL DATA:  Achalasia with megaesophagus. NG tube placement requested. EXAM: NASO G TUBE PLACEMENT WITH FL AND WITH RAD CONTRAST:  10 cc Isovue 300 per tube. FLUOROSCOPY TIME:   Fluoroscopy Time:  2 minutes 48  seconds Number of Acquired Spot Images: 0 COMPARISON:  03/31/2018 CT chest. FINDINGS: Left nasal route nasoenteric tube was advanced to the level of the esophagogastric junction. The tube could not be traversed into the stomach. The tip was left at the esophagogastric junction. IMPRESSION: Left nasal route nasoenteric tube advanced to the level of the esophagogastric junction, unable to traverse into the stomach. Enteric tube tip location confirmed at the esophagogastric junction. These results were called by telephone at the time of interpretation on 04/01/2018 at 11:28 am to Dr. Rolm Bookbinder , who verbally acknowledged these results. Electronically Signed   By: Ilona Sorrel M.D.   On: 04/01/2018 11:39   Ct Image Guided Drainage By Percutaneous Catheter  Result Date: 04/10/2018 INDICATION: Left upper quadrant perisplenic abscess EXAM: CT GUIDED DRAINAGE OF LEFT UPPER QUADRANT PERISPLENIC ABSCESS MEDICATIONS: The patient is currently admitted to the hospital and receiving intravenous antibiotics. The antibiotics were administered within an appropriate time frame prior to the initiation of the procedure. ANESTHESIA/SEDATION: 1.0 mg IV Versed 50 mcg IV Fentanyl Moderate Sedation Time:  17 MINUTES The patient was continuously monitored during the procedure by the interventional radiology nurse under my direct supervision. COMPLICATIONS: None immediate. TECHNIQUE: Informed written consent was obtained from the patient after a thorough discussion of the procedural risks, benefits and alternatives. All questions were addressed. Maximal Sterile Barrier Technique was utilized including caps, mask, sterile gowns, sterile gloves, sterile drape, hand hygiene and skin antiseptic. A timeout was performed prior to the initiation of the procedure. PROCEDURE: The left upper quadrant was prepped with ChloraPrep in a sterile fashion, and a sterile drape was applied covering the operative field.  A sterile gown and sterile gloves were used for the procedure. Local anesthesia was provided with 1% Lidocaine. Previous imaging reviewed. Patient position left anterior oblique. Noncontrast localization CT performed. The left upper quadrant perisplenic abscess was localized. Overlying skin marked for a lateral lower intercostal approach. Under sterile conditions and local anesthesia, an 18 gauge 10 cm access needle was advanced into the perisplenic abscess. Needle position confirmed with CT. Syringe aspiration yielded purulent fluid. Sample sent for culture. Amplatz guidewire inserted followed by tract dilatation to insert a 12 [place] drain. Drain catheter position confirmed with CT. 2 30 cc purulent fluid aspirated. Catheter secured with a Prolene suture and connected to external suction bulb. Sterile dressing applied. No immediate complication. Patient tolerated the procedure well. FINDINGS: Imaging confirms percutaneous needle access of the perisplenic abscess for drain insertion IMPRESSION: Successful CT-guided left upper quadrant perisplenic abscess drain placement Electronically Signed   By: Jerilynn Mages.  Shick M.D.   On: 04/10/2018 15:23   Ir Radiologist Eval & Mgmt  Result Date: 04/24/2018 Please refer to notes tab for details about interventional procedure. (Op Note)   Labs:  CBC: Recent Labs    04/07/18 0351 04/08/18 0423 04/11/18 0555 04/12/18 0942  WBC 18.4* 23.7* 18.5* 20.1*  HGB 13.3 12.8* 12.9* 12.6*  HCT 39.6 37.9* 38.2* 37.3*  PLT 515* 523* 899* 950*    COAGS: Recent Labs    03/30/18 1720 03/31/18 0331 04/10/18 0608  INR 1.07 1.39 1.06  APTT  --  32  --     BMP: Recent Labs    04/08/18 0423 04/10/18 0608 04/11/18 0555 04/12/18 0942  NA 130* 130* 128* 125*  K 4.4 4.3 4.3 4.4  CL 98* 95* 95* 90*  CO2 25 25 26 25   GLUCOSE 111* 96 102* 134*  BUN 13 15 16 12   CALCIUM 8.4*  9.0 8.7* 9.0  CREATININE 0.64 0.66 0.34* 0.73  GFRNONAA >60 >60 >60 >60  GFRAA >60 >60 >60 >60      LIVER FUNCTION TESTS: Recent Labs    03/30/18 1308 03/31/18 0331 04/01/18 0332 04/12/18 0942  BILITOT 1.7* 1.1 0.9 1.0  AST 24 18 18 19   ALT 17 12* 13* 25  ALKPHOS 59 37* 45 180*  PROT 7.2 5.3* 5.5* 6.7  ALBUMIN 4.0 2.9* 2.3* 2.4*    Assessment and Plan:  Raymond Jacobs appears to be doing well clinically.  Follow-up CT performed today shows essential resolution of the perisplenic abscess with minimal residual fluid remaining around the drainage catheter.  There is no significant fluid drainage from the catheter currently.  CT also shows significant improvement in free fluid surrounding the liver with only a small amount of fluid mating adjacent to the lateral right lobe.  There was not felt to be any need for drain injection under fluoroscopy prior to drain removal.  The percutaneous drainage catheter was removed today without complication.  Raymond Jacobs is scheduled to follow-up with Dr. Kieth Brightly tomorrow.   Electronically SignedAletta Edouard T 04/24/2018, 12:10 PM     I spent a total of 15 Minutes in face to face in clinical consultation, greater than 50% of which was counseling/coordinating care for management of a perisplenic abscess drainage catheter.

## 2018-05-01 ENCOUNTER — Encounter (INDEPENDENT_AMBULATORY_CARE_PROVIDER_SITE_OTHER): Payer: Self-pay | Admitting: Physician Assistant

## 2018-05-01 ENCOUNTER — Other Ambulatory Visit: Payer: Self-pay

## 2018-05-01 ENCOUNTER — Ambulatory Visit (INDEPENDENT_AMBULATORY_CARE_PROVIDER_SITE_OTHER): Payer: Medicare Other | Admitting: Physician Assistant

## 2018-05-01 VITALS — BP 118/71 | HR 78 | Temp 97.9°F | Ht 64.5 in | Wt 113.4 lb

## 2018-05-01 DIAGNOSIS — R5383 Other fatigue: Secondary | ICD-10-CM | POA: Diagnosis not present

## 2018-05-01 DIAGNOSIS — Z760 Encounter for issue of repeat prescription: Secondary | ICD-10-CM | POA: Diagnosis not present

## 2018-05-01 DIAGNOSIS — Z1159 Encounter for screening for other viral diseases: Secondary | ICD-10-CM

## 2018-05-01 DIAGNOSIS — Z09 Encounter for follow-up examination after completed treatment for conditions other than malignant neoplasm: Secondary | ICD-10-CM

## 2018-05-01 DIAGNOSIS — K269 Duodenal ulcer, unspecified as acute or chronic, without hemorrhage or perforation: Secondary | ICD-10-CM

## 2018-05-01 MED ORDER — UMECLIDINIUM-VILANTEROL 62.5-25 MCG/INH IN AEPB: 1.0000 | INHALATION_SPRAY | Freq: Every day | RESPIRATORY_TRACT | 0 refills | Status: DC

## 2018-05-01 MED ORDER — ALBUTEROL SULFATE HFA 108 (90 BASE) MCG/ACT IN AERS: 2.0000 | INHALATION_SPRAY | RESPIRATORY_TRACT | 0 refills | Status: DC | PRN

## 2018-05-01 MED ORDER — PANTOPRAZOLE SODIUM 40 MG PO TBEC: 40.0000 mg | DELAYED_RELEASE_TABLET | Freq: Every day | ORAL | 0 refills | Status: DC

## 2018-05-01 MED ORDER — SUCRALFATE 1 G PO TABS: 1.0000 g | ORAL_TABLET | Freq: Two times a day (BID) | ORAL | 0 refills | Status: DC

## 2018-05-01 NOTE — Progress Notes (Signed)
Subjective:  Patient ID: Raymond Jacobs, male    DOB: 03-30-1951  Age: 67 y.o. MRN: [id number]  CC: hospital f/u, perforated duodenal ulcer  HPI Raymond Jacobs is a 67 y.o. male with a medical history of PUD and gastritis presents as a new patient on hospital f/u. Admitted on 03/30/18 and discharged 04/13/18. Hospital course annotated as following:  Patient was admitted. CT scan was reviewed and showed an ulcerated region in the duodenum without free air and ascites. He also had a fluid collection in the right chest that appeared to be an empyema or patulous esophagus. Repeat CT scan 6/8 with PO contrast showed achalasia. Patient exhibited worsening respiratory status and pulmonology was consulted, they recommended bronchodilators. 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 NGT removed 6/11 and patient started on clear liquids. Patient transferred to ICU 6/11 due to worsening respiratory status. Respiratory culture grew few yeast which were felt to likely be normal flora. Patient was improving from an abdominal standpoint and was advanced to a soft diet 6/14. Patient transferred out of the ICU 6/16. Follow up CT 6/16 showed interval loculation of the perisplenic fluid collection and IR was consulted for percutaneous drainage. Drain placed 6/18 and patient tolerated well.   Patient has since scheduled an appointment with pulmonology on 05/09/18 with [facility]. Says he is feeling much better. He has become active with walking/running/disc golf. Not currently taking any anti-acids because he is concerned PPI may be causing osteoporosis. Does not have any symptoms. Endorses some generalized weakness attributed to deconditioning while in hospital. Has been exercising and consuming supplemental protein shakes. Eat three small meals per day. Denies CP,  palpitations, SOB, HA, abdominal pain, f/c/n/v, rash, swelling, or GI/GU sxs.    Outpatient Medications Prior to Visit  Medication Sig Dispense Refill  . acetaminophen (TYLENOL) 500 MG tablet Take 500 mg by mouth every 6 (six) hours as needed (for pain).     Marland Kitchen albuterol (PROVENTIL) (2.5 MG/3ML) 0.083% nebulizer solution Take 3 mLs (2.5 mg total) by nebulization every 4 (four) hours as needed for wheezing or shortness of breath. (Patient not taking: Reported on 04/24/2018) 75 mL 2  . B Complex-C-Folic Acid (SUPER B-COMPLEX/VIT C/FA) TABS Take 1 tablet by mouth 2 (two) times a week.    . clobetasol ointment (TEMOVATE) 0.05 % Apply topically 2 (two) times daily. (Patient not taking: Reported on 03/30/2018) 30 g 1  . Cyanocobalamin (VITAMIN B 12 PO) Take 1 tablet by mouth 2 (two) times a week.     . ECHINACEA PO Take 2 capsules by mouth 2 (two) times a week.    . lanolin ointment Apply topically as needed for dry skin. (Patient not taking: Reported on 03/30/2018) 454 g 0  . Omega-3 Fatty Acids (FISH OIL PO) Take 1 capsule by mouth 2 (two) times a week.     Marland Kitchen oxyCODONE (OXY IR/ROXICODONE) 5 MG immediate release tablet Take 1-2 tablets (5-10 mg total) by mouth every 4 (four) hours as needed for moderate pain. (Patient not taking: Reported on 04/24/2018) 15 tablet 0  . pantoprazole (PROTONIX) 40 MG tablet Take 1 tablet (40 mg total) by mouth daily. (Patient not taking: Reported on 04/24/2018) 30 tablet 3  . umeclidinium-vilanterol (ANORO ELLIPTA) 62.5-25 MCG/INH AEPB Inhale 1 puff into the lungs daily. 14 each 2  . VITAMIN E PO Take 1 capsule by mouth 2 (two) times a week.  No facility-administered medications prior to visit.      ROS Review of Systems  Constitutional: Negative for chills, fever and malaise/fatigue.  Eyes: Negative for blurred vision.  Respiratory: Negative for shortness of breath.   Cardiovascular: Negative for chest pain and palpitations.  Gastrointestinal: Negative for abdominal  pain and nausea.  Genitourinary: Negative for dysuria and hematuria.  Musculoskeletal: Negative for joint pain and myalgias.  Skin: Negative for rash.  Neurological: Negative for tingling and headaches.  Psychiatric/Behavioral: Negative for depression. The patient is not nervous/anxious.     Objective:  Wt 113 lb 6.4 oz (51.4 kg)   BMI 19.16 kg/m   Vitals:   05/01/18 1508  BP: 118/71  Pulse: 78  Temp: 97.9 F (36.6 C)  TempSrc: Oral  SpO2: 97%  Weight: 113 lb 6.4 oz (51.4 kg)  Height: 5' 4.5" (1.638 m)    Physical Exam  Constitutional: He is oriented to person, place, and time.  emaciated, NAD, polite  HENT:  Head: Normocephalic and atraumatic.  Eyes: No scleral icterus.  Neck: Normal range of motion. Neck supple. No thyromegaly present.  Cardiovascular: Normal rate, regular rhythm and normal heart sounds.  Pulmonary/Chest: Effort normal and breath sounds normal.  Abdominal: Soft. Bowel sounds are normal. He exhibits no distension. There is no tenderness. There is no guarding.  hepatomegaly  Musculoskeletal: He exhibits no edema.  Neurological: He is alert and oriented to person, place, and time.  Skin: Skin is warm and dry. No rash noted. No erythema. No pallor.  Psychiatric: He has a normal mood and affect. His behavior is normal. Thought content normal.  Vitals reviewed.    Assessment & Plan:    1. Hospital discharge follow-up [facility] notes reviewed  2. Duodenal ulcer - Begin sucralfate (CARAFATE) 1 g tablet; Take 1 tablet (1 g total) by mouth 2 (two) times daily.  Dispense: 60 tablet; Refill: 0 - CBC with Differential  3. Medication refill - umeclidinium-vilanterol (ANORO ELLIPTA) 62.5-25 MCG/INH AEPB; Inhale 1 puff into the lungs daily.  Dispense: 14 each; Refill: 0 - albuterol (PROVENTIL HFA;VENTOLIN HFA) 108 (90 Base) MCG/ACT inhaler; Inhale 2 puffs into the lungs every 4 (four) hours as needed for wheezing or shortness of breath.  Dispense: 1  Inhaler; Refill: 0  4. Fatigue, unspecified type - CBC with Differential - Basic Metabolic Panel - TSH  5. Need for hepatitis C screening test - Hepatitis c antibody (reflex)   Meds ordered this encounter  Medications  . sucralfate (CARAFATE) 1 g tablet    Sig: Take 1 tablet (1 g total) by mouth 2 (two) times daily.    Dispense:  60 tablet    Refill:  0    Order Specific Question:   Supervising Provider    Answer:   Charlott Rakes [[id number]]  . pantoprazole (PROTONIX) 40 MG tablet    Sig: Take 1 tablet (40 mg total) by mouth daily.    Dispense:  30 tablet    Refill:  0    Order Specific Question:   Supervising Provider    Answer:   Charlott Rakes [[id number]]  . umeclidinium-vilanterol (ANORO ELLIPTA) 62.5-25 MCG/INH AEPB    Sig: Inhale 1 puff into the lungs daily.    Dispense:  14 each    Refill:  0    Order Specific Question:   Supervising Provider    Answer:   Charlott Rakes [[id number]]  . albuterol (PROVENTIL HFA;VENTOLIN HFA) 108 (90 Base) MCG/ACT inhaler    Sig: Inhale 2  puffs into the lungs every 4 (four) hours as needed for wheezing or shortness of breath.    Dispense:  1 Inhaler    Refill:  0    Order Specific Question:   Supervising Provider    Answer:   Charlott Rakes [[id number]]    Follow-up: Return in about 8 weeks (around 06/26/2018) for f/u on underweight.   Clent Demark PA

## 2018-05-01 NOTE — Patient Instructions (Signed)
Duodenitis °Duodenitis is inflammation of the lining of the first part of the small intestine (duodenum). It is commonly caused by a bacterial infection, which may also lead to open sores (ulcers) in the intestine. °Duodenitis may develop suddenly and last for a short time (acute) or it may develop gradually and last for months or years (chronic). °What are the causes? °The most common cause of duodenitis is an infection from H. pylori bacteria. Other causes of this condition include: °· Long-term use of NSAIDs. °· Excessive use of alcohol. °· An infection of the small intestine (giardiasis). °· Crohn’s disease. °· Certain diseases of the immune system. °· Certain treatments for cancer. ° °What increases the risk? ° °The following factors may make you more likely to develop duodenitis: °· Smoking cigarettes. °· Drinking alcohol. °· Having a family history of duodenitis. °· Taking NSAIDs. °· Eating a high-fat diet. ° °What are the signs or symptoms? °Symptoms of this condition may include: °· Gnawing or burning pain in the upper center of the abdomen (epigastric pain). This may get worse when the stomach is empty and may get better after eating. °· Abdominal cramps. °· Nausea and vomiting. °· Bloody vomit. °· Stools that are bloody, dark, or look like tar. °· Diarrhea. °· Weight loss. °· Fatigue. ° °How is this diagnosed? °This condition may be diagnosed based on your medical history and a physical exam. You may also have tests, such as: °· Blood tests. °· Stool tests. °· A test that checks the gases in your breath. °· An X-ray that is done after you swallow a liquid (barium) that makes your digestive tract easier to see. °· Endoscopy. This is an exam of the duodenum that is done by putting a thin tube with a tiny camera on the end down your throat (endoscopy). A sample of tissue from your duodenum (biopsy) may be removed with the endoscope and examined under a microscope for signs of inflammation and  infection. ° °How is this treated? °Treatment depends on the cause of your condition. Treatment may include: °· Antibiotic medicine to treat H. pylori infection. °· Stopping your intake of NSAIDs. °· Medicine to reduce stomach acids. °· Medicine to treat Crohn’s disease. °· Surgery to treat severe inflammation that causes scarring or severe bleeding. ° °Follow these instructions at home: °Medicines °· Take over-the-counter and prescription medicines only as told by your health care provider. °· If you were prescribed antibiotic medicine, take it as told by your health care provider. Do not stop taking the antibiotic even if you start to feel better. °Eating and drinking ° °· Eat small, frequent meals. °· Do not drink alcohol. °· Drink enough water to keep your urine clear or pale yellow. °· Follow instructions from your health care provider about eating or drinking restrictions. You may be asked to avoid: °? Caffeinated drinks. °? Chocolate. °? Peppermint or mint-flavored food or drinks. °? Garlic. °? Onions. °? Spicy foods. °? Citrus fruits. °? Tomato-based foods. °? Fatty foods. °? Fried foods. °Contact a health care provider if: °· You have a fever. °· Your symptoms come back, get worse, or do not get better with treatment. °Get help right away if: °· You vomit blood. °· You have severe abdominal pain. °· Your abdomen swells and is painful. °· You have a lot of blood in your stool. °· You feel dizzy or light-headed. °This information is not intended to replace advice given to you by your health care provider. Make sure you discuss   any questions you have with your health care provider. °Document Released: 02/04/2013 Document Revised: 03/17/2016 Document Reviewed: 08/13/2015 °Elsevier Interactive Patient Education © 2018 [employer] ° °

## 2018-05-02 LAB — CBC WITH DIFFERENTIAL/PLATELET
BASOS ABS: 0.1 10*3/uL (ref 0.0–0.2)
Basos: 1 %
EOS (ABSOLUTE): 0.3 10*3/uL (ref 0.0–0.4)
Eos: 2 %
Hematocrit: 33.6 % — ABNORMAL LOW (ref 37.5–51.0)
Hemoglobin: 10.9 g/dL — ABNORMAL LOW (ref 13.0–17.7)
Immature Grans (Abs): 0 10*3/uL (ref 0.0–0.1)
Immature Granulocytes: 0 %
LYMPHS ABS: 2.8 10*3/uL (ref 0.7–3.1)
Lymphs: 24 %
MCH: 27.9 pg (ref 26.6–33.0)
MCHC: 32.4 g/dL (ref 31.5–35.7)
MCV: 86 fL (ref 79–97)
MONOCYTES: 8 %
Monocytes Absolute: 1 10*3/uL — ABNORMAL HIGH (ref 0.1–0.9)
NEUTROS ABS: 7.4 10*3/uL — AB (ref 1.4–7.0)
Neutrophils: 65 %
Platelets: 526 10*3/uL — ABNORMAL HIGH (ref 150–450)
RBC: 3.91 x10E6/uL — AB (ref 4.14–5.80)
RDW: 15.1 % (ref 12.3–15.4)
WBC: 11.7 10*3/uL — AB (ref 3.4–10.8)

## 2018-05-02 LAB — HEPATITIS C ANTIBODY (REFLEX)

## 2018-05-02 LAB — BASIC METABOLIC PANEL
BUN / CREAT RATIO: 13 (ref 10–24)
BUN: 8 mg/dL (ref 8–27)
CO2: 25 mmol/L (ref 20–29)
CREATININE: 0.63 mg/dL — AB (ref 0.76–1.27)
Calcium: 9.2 mg/dL (ref 8.6–10.2)
Chloride: 90 mmol/L — ABNORMAL LOW (ref 96–106)
GFR calc Af Amer: 119 mL/min/{1.73_m2} (ref >59)
GFR calc non Af Amer: 103 mL/min/{1.73_m2} (ref >59)
GLUCOSE: 84 mg/dL (ref 65–99)
POTASSIUM: 4.7 mmol/L (ref 3.5–5.2)
Sodium: 129 mmol/L — ABNORMAL LOW (ref 134–144)

## 2018-05-02 LAB — HCV COMMENT:

## 2018-05-02 LAB — TSH: TSH: 1.6 u[IU]/mL (ref 0.450–4.500)

## 2018-05-04 ENCOUNTER — Telehealth (INDEPENDENT_AMBULATORY_CARE_PROVIDER_SITE_OTHER): Payer: Self-pay

## 2018-05-04 NOTE — Telephone Encounter (Signed)
-----   Message from Clent Demark, PA-C sent at 05/03/2018  9:25 AM EDT ----- Pt anemic. I am sending iron pills to his pharmacy. Also needs to return within 2-4 weeks for another CBC. He should also temporarily increase salt intake. Labs otherwise showing less inflammation. HCV Negative.

## 2018-05-04 NOTE — Telephone Encounter (Signed)
Patient is aware that he is anemic, iron pills sent to his pharmacy. Return in 2-4 weeks( between 7/25 and 8/8/) for repeat CBC. Increase salt intake temporarily. Less inflammation and negative hepatitis C. Nat Christen, CMA

## 2018-05-09 ENCOUNTER — Ambulatory Visit (INDEPENDENT_AMBULATORY_CARE_PROVIDER_SITE_OTHER): Payer: Medicare Other | Admitting: Pulmonary Disease

## 2018-05-09 ENCOUNTER — Encounter: Payer: Self-pay | Admitting: Pulmonary Disease

## 2018-05-09 VITALS — BP 100/64 | HR 93 | Ht 64.5 in | Wt 113.5 lb

## 2018-05-09 DIAGNOSIS — J9601 Acute respiratory failure with hypoxia: Secondary | ICD-10-CM | POA: Diagnosis not present

## 2018-05-09 DIAGNOSIS — J432 Centrilobular emphysema: Secondary | ICD-10-CM | POA: Diagnosis not present

## 2018-05-09 DIAGNOSIS — J9811 Atelectasis: Secondary | ICD-10-CM

## 2018-05-09 DIAGNOSIS — K220 Achalasia of cardia: Secondary | ICD-10-CM

## 2018-05-09 MED ORDER — ALBUTEROL SULFATE HFA 108 (90 BASE) MCG/ACT IN AERS: 2.0000 | INHALATION_SPRAY | Freq: Four times a day (QID) | RESPIRATORY_TRACT | 6 refills | Status: DC | PRN

## 2018-05-09 NOTE — Assessment & Plan Note (Signed)
Much improved on follow-up imaging
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
## 2018-05-09 NOTE — Assessment & Plan Note (Signed)
Referral to gastroenterologist for achalasia and peptic ulcer disease

## 2018-05-09 NOTE — Assessment & Plan Note (Signed)
Even though he has significant bullous emphysema on CT scan, surprisingly lung function is well-maintained on spirometry.  His diffusion capacity may be low, but he is relatively asymptomatic.  Smoking cessation was emphasized as the main intervention at this point  Rx for albuterol MDI to use on an as-needed basis We will use long-acting bronchodilators if he becomes more symptomatic in the future Hypoxia has been resolved and he will be taken off oxygen

## 2018-05-09 NOTE — Patient Instructions (Addendum)
Referral to gastroenterologist for achalasia/ dilated esophagus.   You have severe emphysema. You have to quit smoking completely.  Rx for albuterol MDI to use on an as-needed basis

## 2018-05-10 ENCOUNTER — Encounter: Payer: Self-pay | Admitting: Internal Medicine

## 2018-05-28 ENCOUNTER — Other Ambulatory Visit (INDEPENDENT_AMBULATORY_CARE_PROVIDER_SITE_OTHER): Payer: Medicare Other

## 2018-05-28 DIAGNOSIS — D649 Anemia, unspecified: Secondary | ICD-10-CM

## 2018-05-28 NOTE — Progress Notes (Signed)
Lab only visit. Labs drawn by onsite [facility] phlebotomist. Nat Christen, CMA

## 2018-05-29 ENCOUNTER — Telehealth (INDEPENDENT_AMBULATORY_CARE_PROVIDER_SITE_OTHER): Payer: Self-pay

## 2018-05-29 LAB — CBC WITH DIFFERENTIAL/PLATELET
BASOS: 1 %
Basophils Absolute: 0.1 10*3/uL (ref 0.0–0.2)
EOS (ABSOLUTE): 0.2 10*3/uL (ref 0.0–0.4)
Eos: 2 %
HEMATOCRIT: 37.4 % — AB (ref 37.5–51.0)
Hemoglobin: 12.1 g/dL — ABNORMAL LOW (ref 13.0–17.7)
IMMATURE GRANULOCYTES: 0 %
Immature Grans (Abs): 0 10*3/uL (ref 0.0–0.1)
LYMPHS ABS: 3 10*3/uL (ref 0.7–3.1)
LYMPHS: 40 %
MCH: 28 pg (ref 26.6–33.0)
MCHC: 32.4 g/dL (ref 31.5–35.7)
MCV: 87 fL (ref 79–97)
MONOCYTES: 6 %
MONOS ABS: 0.5 10*3/uL (ref 0.1–0.9)
NEUTROS PCT: 51 %
Neutrophils Absolute: 3.9 10*3/uL (ref 1.4–7.0)
PLATELETS: 368 10*3/uL (ref 150–450)
RBC: 4.32 x10E6/uL (ref 4.14–5.80)
RDW: 15.9 % — AB (ref 12.3–15.4)
WBC: 7.7 10*3/uL (ref 3.4–10.8)

## 2018-05-29 NOTE — Telephone Encounter (Signed)
Left message informing patient that anemia is better than previous reading and is nearing normal. Will continue to monitor at future visits. Please call our office with any questions or concerns. Nat Christen, CMA

## 2018-05-29 NOTE — Telephone Encounter (Signed)
-----   Message from Clent Demark, PA-C sent at 05/29/2018  8:24 AM EDT ----- Anemia is better than previous lab and nearing normal. Will continue to monitor at future visits.

## 2018-06-26 ENCOUNTER — Encounter (INDEPENDENT_AMBULATORY_CARE_PROVIDER_SITE_OTHER): Payer: Self-pay | Admitting: Physician Assistant

## 2018-06-26 ENCOUNTER — Other Ambulatory Visit: Payer: Self-pay

## 2018-06-26 ENCOUNTER — Ambulatory Visit (INDEPENDENT_AMBULATORY_CARE_PROVIDER_SITE_OTHER): Payer: Medicare Other | Admitting: Physician Assistant

## 2018-06-26 VITALS — BP 105/67 | HR 79 | Temp 97.7°F | Ht 64.5 in | Wt 119.6 lb

## 2018-06-26 DIAGNOSIS — Z23 Encounter for immunization: Secondary | ICD-10-CM | POA: Diagnosis not present

## 2018-06-26 DIAGNOSIS — D649 Anemia, unspecified: Secondary | ICD-10-CM

## 2018-06-26 DIAGNOSIS — Z760 Encounter for issue of repeat prescription: Secondary | ICD-10-CM | POA: Diagnosis not present

## 2018-06-26 MED ORDER — SUCRALFATE 1 G PO TABS: 1.0000 g | ORAL_TABLET | Freq: Two times a day (BID) | ORAL | 2 refills | Status: DC

## 2018-06-26 MED ORDER — ENSURE MAX PROTEIN PO LIQD: 1.0000 | Freq: Three times a day (TID) | ORAL | 5 refills | Status: AC

## 2018-06-26 NOTE — Progress Notes (Signed)
Subjective:  Patient ID: Raymond Jacobs, male    DOB: August 11, 1951  Age: 67 y.o. MRN: [id number]  CC: f/u underweight  HPI Raymond Jacobs is a 67 y.o. male with a medical history of PUD, achalasia, megaesophagus, ARDS, PNA, and pleural effusion presents to f/u on concern for being underweight. Last BMI 19.16 nearly one month ago after discharge from hospital for PNA, ARDS, and megaesophagus. BMI 20.21 today. Says he would like to gain more weight. Admits to not having a "big appetite". Considers himself to be active. Does not endorse malaise, debility, fatigue, f/c/n/v, abdominal pain, hematochezia, urinary sxs, CP, palpitations, SOB, HA, or rash.   Outpatient Medications Prior to Visit  Medication Sig Dispense Refill  . acetaminophen (TYLENOL) 500 MG tablet Take 500 mg by mouth every 6 (six) hours as needed (for pain).     Marland Kitchen albuterol (PROVENTIL HFA;VENTOLIN HFA) 108 (90 Base) MCG/ACT inhaler Inhale 2 puffs into the lungs every 6 (six) hours as needed for wheezing or shortness of breath. 1 Inhaler 6  . B Complex-C-Folic Acid (SUPER B-COMPLEX/VIT C/FA) TABS Take 1 tablet by mouth 2 (two) times a week.    . Cyanocobalamin (VITAMIN B 12 PO) Take 1 tablet by mouth 2 (two) times a week.     . ECHINACEA PO Take 2 capsules by mouth 2 (two) times a week.    . Omega-3 Fatty Acids (FISH OIL PO) Take 1 capsule by mouth 2 (two) times a week.     . sucralfate (CARAFATE) 1 g tablet Take 1 tablet (1 g total) by mouth 2 (two) times daily. 60 tablet 0  . umeclidinium-vilanterol (ANORO ELLIPTA) 62.5-25 MCG/INH AEPB Inhale 1 puff into the lungs daily. 14 each 0  . VITAMIN E PO Take 1 capsule by mouth 2 (two) times a week.    Marland Kitchen albuterol (PROVENTIL HFA;VENTOLIN HFA) 108 (90 Base) MCG/ACT inhaler Inhale 2 puffs into the lungs every 4 (four) hours as needed for wheezing or shortness of breath. (Patient not taking: Reported on 05/09/2018) 1 Inhaler 0   No facility-administered medications prior to visit.       ROS Review of Systems  Constitutional: Negative for chills, fever and malaise/fatigue.  Eyes: Negative for blurred vision.  Respiratory: Negative for shortness of breath.   Cardiovascular: Negative for chest pain and palpitations.  Gastrointestinal: Negative for abdominal pain and nausea.  Genitourinary: Negative for dysuria and hematuria.  Musculoskeletal: Negative for joint pain and myalgias.  Skin: Negative for rash.  Neurological: Negative for tingling and headaches.  Psychiatric/Behavioral: Negative for depression. The patient is not nervous/anxious.     Objective:  BP 105/67 (BP Location: Left Arm, Patient Position: Sitting, Cuff Size: Normal)   Pulse 79   Temp 97.7 F (36.5 C) (Oral)   Ht 5' 4.5" (1.638 m)   Wt 119 lb 9.6 oz (54.3 kg)   SpO2 97%   BMI 20.21 kg/m   BP/Weight 06/26/2018 05/01/6282 03/29/2946  Systolic BP 654 650 354  Diastolic BP 67 64 71  Wt. (Lbs) 119.6 113.5 113.4  BMI 20.21 19.18 19.16      Physical Exam  Constitutional: He is oriented to person, place, and time.  Well developed, well nourished, NAD, polite  HENT:  Head: Normocephalic and atraumatic.  Eyes: No scleral icterus.  Neck: Normal range of motion. Neck supple. No thyromegaly present.  Cardiovascular: Normal rate, regular rhythm and normal heart sounds.  Pulmonary/Chest: Effort normal and breath sounds normal.  Abdominal: Soft. Bowel sounds are normal. There  is no tenderness.  Musculoskeletal: He exhibits no edema.  Neurological: He is alert and oriented to person, place, and time.  Skin: Skin is warm and dry. No rash noted. No erythema. No pallor.  Psychiatric: He has a normal mood and affect. His behavior is normal. Thought content normal.  Vitals reviewed.    Assessment & Plan:    1. Need for influenza vaccination - Flu Vaccine QUAD 6+ mos PF IM (Fluarix Quad PF)  2. Need for prophylactic vaccination against Streptococcus pneumoniae (pneumococcus) - Pneumococcal  conjugate vaccine 13-valent IM  3. Anemia, unspecified type - Last Hgb 12.1 g/dL attributed to duodenal ulcer. - CBC with Differential  4. Medication refill - sucralfate (CARAFATE) 1 g tablet; Take 1 tablet (1 g total) by mouth 2 (two) times daily.  Dispense: 60 tablet; Refill: 2. For duodenal ulcer.     Meds ordered this encounter  Medications  . Nutritional Supplements (ENSURE MAX PROTEIN) LIQD    Sig: Take 1 each by mouth 3 (three) times daily. Please dispense 90 bottles per month    Dispense:  330 mL    Refill:  5    Order Specific Question:   Supervising Provider    Answer:   Charlott Rakes [[id number]]  . sucralfate (CARAFATE) 1 g tablet    Sig: Take 1 tablet (1 g total) by mouth 2 (two) times daily.    Dispense:  60 tablet    Refill:  2    Order Specific Question:   Supervising Provider    Answer:   Charlott Rakes [[id number]]    Follow-up: Return in about 6 months (around 12/25/2018).   Clent Demark PA

## 2018-06-26 NOTE — Patient Instructions (Signed)
Pneumococcal Conjugate Vaccine (PCV13) What You Need to Know 1. Why get vaccinated? Vaccination can protect both children and adults from pneumococcal disease. Pneumococcal disease is caused by bacteria that can spread from person to person through close contact. It can cause ear infections, and it can also lead to more serious infections of the:  Lungs (pneumonia),  Blood (bacteremia), and  Covering of the brain and spinal cord (meningitis).  Pneumococcal pneumonia is most common among adults. Pneumococcal meningitis can cause deafness and brain damage, and it kills about 1 child in 10 who get it. Anyone can get pneumococcal disease, but children under 2 years of age and adults 65 years and older, people with certain medical conditions, and cigarette smokers are at the highest risk. Before there was a vaccine, the [place] saw:  more than 700 cases of meningitis,  about 13,000 blood infections,  about 5 million ear infections, and  about 200 deaths  in children under 5 each year from pneumococcal disease. Since vaccine became available, severe pneumococcal disease in these children has fallen by 88%. About 18,000 older adults die of pneumococcal disease each year in the [place]. Treatment of pneumococcal infections with penicillin and other drugs is not as effective as it used to be, because some strains of the disease have become resistant to these drugs. This makes prevention of the disease, through vaccination, even more important. 2. PCV13 vaccine Pneumococcal conjugate vaccine (called PCV13) protects against 13 types of pneumococcal bacteria. PCV13 is routinely given to children at 2, 4, 6, and 12-15 months of age. It is also recommended for children and adults 2 to 64 years of age with certain health conditions, and for all adults 65 years of age and older. Your doctor can give you details. 3. Some people should not get this vaccine Anyone who has ever had a  life-threatening allergic reaction to a dose of this vaccine, to an earlier pneumococcal vaccine called PCV7, or to any vaccine containing diphtheria toxoid (for example, DTaP), should not get PCV13. Anyone with a severe allergy to any component of PCV13 should not get the vaccine. Tell your doctor if the person being vaccinated has any severe allergies. If the person scheduled for vaccination is not feeling well, your healthcare provider might decide to reschedule the shot on another day. 4. Risks of a vaccine reaction With any medicine, including vaccines, there is a chance of reactions. These are usually mild and go away on their own, but serious reactions are also possible. Problems reported following PCV13 varied by age and dose in the series. The most common problems reported among children were:  About half became drowsy after the shot, had a temporary loss of appetite, or had redness or tenderness where the shot was given.  About 1 out of 3 had swelling where the shot was given.  About 1 out of 3 had a mild fever, and about 1 in 20 had a fever over 102.2F.  Up to about 8 out of 10 became fussy or irritable.  Adults have reported pain, redness, and swelling where the shot was given; also mild fever, fatigue, headache, chills, or muscle pain. Young children who get PCV13 along with inactivated flu vaccine at the same time may be at increased risk for seizures caused by fever. Ask your doctor for more information. Problems that could happen after any vaccine:  People sometimes faint after a medical procedure, including vaccination. Sitting or lying down for about 15 minutes can help prevent   fainting, and injuries caused by a fall. Tell your doctor if you feel dizzy, or have vision changes or ringing in the ears.  Some older children and adults get severe pain in the shoulder and have difficulty moving the arm where a shot was given. This happens very rarely.  Any medication can cause a  severe allergic reaction. Such reactions from a vaccine are very rare, estimated at about 1 in a million doses, and would happen within a few minutes to a few hours after the vaccination. As with any medicine, there is a very small chance of a vaccine causing a serious injury or death. The safety of vaccines is always being monitored. For more information, visit: http://www.aguilar.org/ 5. What if there is a serious reaction? What should I look for? Look for anything that concerns you, such as signs of a severe allergic reaction, very high fever, or unusual behavior. Signs of a severe allergic reaction can include hives, swelling of the face and throat, difficulty breathing, a fast heartbeat, dizziness, and weakness-usually within a few minutes to a few hours after the vaccination. What should I do?  If you think it is a severe allergic reaction or other emergency that can't wait, call 9-1-1 or get the person to the nearest hospital. Otherwise, call your doctor.  Reactions should be reported to the Vaccine Adverse Event Reporting System (VAERS). Your doctor should file this report, or you can do it yourself through the VAERS web site at www.vaers.SamedayNews.es, or by calling 571-426-3994. ? VAERS does not give medical advice. 6. The National Vaccine Injury Compensation Program The [employer] Vaccine Injury Compensation Program (VICP) is a federal program that was created to compensate people who may have been injured by certain vaccines. Persons who believe they may have been injured by a vaccine can learn about the program and about filing a claim by calling 202-653-2058 or visiting the [place] website at GoldCloset.com.ee. There is a time limit to file a claim for compensation. 7. How can I learn more?  Ask your healthcare provider. He or she can give you the vaccine package insert or suggest other sources of information.  Call your local or state health department.  Contact the  Centers for Disease Control and Prevention (CDC): ? Call 613-321-2022 (1-800-CDC-INFO) or ? Visit CDC's website at http://hunter.com/ Vaccine Information Statement, PCV13 Vaccine (08/28/2014) This information is not intended to replace advice given to you by your health care provider. Make sure you discuss any questions you have with your health care provider. Document Released: 08/07/2006 Document Revised: 06/30/2016 Document Reviewed: 06/30/2016 Elsevier Interactive Patient Education  2017 [place].   Influenza Virus Vaccine injection (Fluarix) What is this medicine? INFLUENZA VIRUS VACCINE (in floo EN zuh VAHY ruhs vak SEEN) helps to reduce the risk of getting influenza also known as the flu. This medicine may be used for other purposes; ask your health care provider or pharmacist if you have questions. COMMON BRAND NAME(S): Fluarix, Fluzone What should I tell my health care provider before I take this medicine? They need to know if you have any of these conditions: -bleeding disorder like hemophilia -fever or infection -Guillain-Barre syndrome or other neurological problems -immune system problems -infection with the human immunodeficiency virus (HIV) or AIDS -low blood platelet counts -multiple sclerosis -an unusual or allergic reaction to influenza virus vaccine, eggs, chicken proteins, latex, gentamicin, other medicines, foods, dyes or preservatives -pregnant or trying to get pregnant -breast-feeding How should I use this medicine? This vaccine is for  injection into a muscle. It is given by a health care professional. A copy of Vaccine Information Statements will be given before each vaccination. Read this sheet carefully each time. The sheet may change frequently. Talk to your pediatrician regarding the use of this medicine in children. Special care may be needed. Overdosage: If you think you have taken too much of this medicine contact a poison control center or  emergency room at once. NOTE: This medicine is only for you. Do not share this medicine with others. What if I miss a dose? This does not apply. What may interact with this medicine? -chemotherapy or radiation therapy -medicines that lower your immune system like etanercept, anakinra, infliximab, and adalimumab -medicines that treat or prevent blood clots like warfarin -phenytoin -steroid medicines like prednisone or cortisone -theophylline -vaccines This list may not describe all possible interactions. Give your health care provider a list of all the medicines, herbs, non-prescription drugs, or dietary supplements you use. Also tell them if you smoke, drink alcohol, or use illegal drugs. Some items may interact with your medicine. What should I watch for while using this medicine? Report any side effects that do not go away within 3 days to your doctor or health care professional. Call your health care provider if any unusual symptoms occur within 6 weeks of receiving this vaccine. You may still catch the flu, but the illness is not usually as bad. You cannot get the flu from the vaccine. The vaccine will not protect against colds or other illnesses that may cause fever. The vaccine is needed every year. What side effects may I notice from receiving this medicine? Side effects that you should report to your doctor or health care professional as soon as possible: -allergic reactions like skin rash, itching or hives, swelling of the face, lips, or tongue Side effects that usually do not require medical attention (report to your doctor or health care professional if they continue or are bothersome): -fever -headache -muscle aches and pains -pain, tenderness, redness, or swelling at site where injected -weak or tired This list may not describe all possible side effects. Call your doctor for medical advice about side effects. You may report side effects to FDA at 1-800-FDA-1088. Where should I  keep my medicine? This vaccine is only given in a clinic, pharmacy, doctor's office, or other health care setting and will not be stored at home. NOTE: This sheet is a summary. It may not cover all possible information. If you have questions about this medicine, talk to your doctor, pharmacist, or health care provider.  2018 Elsevier/Gold Standard (2008-05-07 09:30:40)

## 2018-06-27 ENCOUNTER — Telehealth (INDEPENDENT_AMBULATORY_CARE_PROVIDER_SITE_OTHER): Payer: Self-pay

## 2018-06-27 LAB — CBC WITH DIFFERENTIAL/PLATELET
Basophils Absolute: 0.1 10*3/uL (ref 0.0–0.2)
Basos: 1 %
EOS (ABSOLUTE): 0.2 10*3/uL (ref 0.0–0.4)
EOS: 2 %
HEMATOCRIT: 40.5 % (ref 37.5–51.0)
HEMOGLOBIN: 13.7 g/dL (ref 13.0–17.7)
IMMATURE GRANS (ABS): 0 10*3/uL (ref 0.0–0.1)
Immature Granulocytes: 0 %
LYMPHS ABS: 2.6 10*3/uL (ref 0.7–3.1)
Lymphs: 34 %
MCH: 29.1 pg (ref 26.6–33.0)
MCHC: 33.8 g/dL (ref 31.5–35.7)
MCV: 86 fL (ref 79–97)
MONOCYTES: 10 %
Monocytes Absolute: 0.8 10*3/uL (ref 0.1–0.9)
NEUTROS ABS: 4.1 10*3/uL (ref 1.4–7.0)
Neutrophils: 53 %
Platelets: 370 10*3/uL (ref 150–450)
RBC: 4.7 x10E6/uL (ref 4.14–5.80)
RDW: 14.4 % (ref 12.3–15.4)
WBC: 7.7 10*3/uL (ref 3.4–10.8)

## 2018-06-27 NOTE — Telephone Encounter (Signed)
Left voicemail notifying patient that his anemia has resolved and to call [facility] with any questions or concerns. Nat Christen, CMA

## 2018-06-27 NOTE — Telephone Encounter (Signed)
-----   Message from Clent Demark, PA-C sent at 06/27/2018 12:34 PM EDT ----- Anemia is resolved. Please notify patient.

## 2018-07-09 ENCOUNTER — Encounter: Payer: Self-pay | Admitting: *Deleted

## 2018-07-17 ENCOUNTER — Ambulatory Visit (INDEPENDENT_AMBULATORY_CARE_PROVIDER_SITE_OTHER): Payer: Medicare Other | Admitting: Internal Medicine

## 2018-07-17 ENCOUNTER — Encounter: Payer: Self-pay | Admitting: Internal Medicine

## 2018-07-17 VITALS — BP 116/68 | HR 67 | Ht 64.5 in | Wt 119.0 lb

## 2018-07-17 DIAGNOSIS — K220 Achalasia of cardia: Secondary | ICD-10-CM | POA: Diagnosis not present

## 2018-07-17 DIAGNOSIS — K265 Chronic or unspecified duodenal ulcer with perforation: Secondary | ICD-10-CM | POA: Diagnosis not present

## 2018-07-17 MED ORDER — FAMOTIDINE 20 MG PO TABS: 20.0000 mg | ORAL_TABLET | Freq: Two times a day (BID) | ORAL | 3 refills | Status: DC
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
## 2018-07-17 NOTE — Patient Instructions (Signed)
You have been scheduled for a CT scan of the abdomen and pelvis at [facility] ([address]---this is in the same building as [facility]).   You are scheduled on 07/30/18 at 11:00 am. You should arrive 15 minutes prior to your appointment time for registration. Please follow the written instructions below on the day of your exam:  WARNING: IF YOU ARE ALLERGIC TO IODINE/X-RAY DYE, PLEASE NOTIFY RADIOLOGY IMMEDIATELY AT [phone]! YOU WILL BE GIVEN A 13 HOUR PREMEDICATION PREP.  1) Do not eat or drink anything after 7:00 am (4 hours prior to your test) 2) You have been given 2 bottles of oral contrast to drink. The solution may taste better if refrigerated, but do NOT add ice or any other liquid to this solution. Shake well before drinking.    Drink 1 bottle of contrast @ 9:00 am (2 hours prior to your exam)  Drink 1 bottle of contrast @ 10:00 am (1 hour prior to your exam)  You may take any medications as prescribed with a small amount of water except for the following: Metformin, Glucophage, Glucovance, Avandamet, Riomet, Fortamet, Actoplus Met, Janumet, Glumetza or Metaglip. The above medications must be held the day of the exam AND 48 hours after the exam.  The purpose of you drinking the oral contrast is to aid in the visualization of your intestinal tract. The contrast solution may cause some diarrhea. Before your exam is started, you will be given a small amount of fluid to drink. Depending on your individual set of symptoms, you may also receive an intravenous injection of x-ray contrast/dye. Plan on being at [facility] for 30 minutes or longer, depending on the type of exam you are having performed.  This test typically takes 30-45 minutes to complete.  If you have any questions regarding your exam or if you need to reschedule, you may call the CT department at [phone] between the hours of 8:00 am and 5:00 pm,  Monday-Friday.  ________________________________________________________________________  Raymond Jacobs have been scheduled for an endoscopy. Please follow written instructions given to you at your visit today. If you use inhalers (even only as needed), please bring them with you on the day of your procedure. Your physician has requested that you go to www.startemmi.com and enter the access code given to you at your visit today. This web site gives a general overview about your procedure. However, you should still follow specific instructions given to you by our office regarding your preparation for the procedure.  We have sent the following medications to your pharmacy for you to pick up at your convenience: Famotidine 20 mg twice daily (increase from your previous dosing)  If you are age 27 or older, your body mass index should be between 23-30. Your Body mass index is 20.11 kg/m. If this is out of the aforementioned range listed, please consider follow up with your Primary Care Provider.  If you are age 67 or younger, your body mass index should be between 19-25. Your Body mass index is 20.11 kg/m. If this is out of the aformentioned range listed, please consider follow up with your Primary Care Provider.

## 2018-07-18 ENCOUNTER — Telehealth: Payer: Self-pay | Admitting: *Deleted

## 2018-07-18 MED ORDER — PANTOPRAZOLE SODIUM 40 MG PO TBEC: 40.0000 mg | DELAYED_RELEASE_TABLET | Freq: Every day | ORAL | 2 refills | Status: DC

## 2018-07-18 NOTE — Telephone Encounter (Signed)
-----   Message from Jerene Bears, MD sent at 07/17/2018  5:46 PM EDT ----- He should be on pantoprazole 40 mg once daily; can use famotidine 20 mg twice daily if needed for reflux symptoms. No NSAIDs

## 2018-07-18 NOTE — Telephone Encounter (Signed)
Left voicemail for patient to call back. Pantoprazole sent to pharmacy.

## 2018-07-19 NOTE — Telephone Encounter (Signed)
Left voicemail for patient to call back. Patient also needs to have bun/creatinine prior to having CT scan done as it has been over 45 days since his last labwork completed.

## 2018-07-19 NOTE — Telephone Encounter (Signed)
Patient has been advised that he needs labwork completed prior to CT scan (preferably tomorrow or Monday). He is also advised that Dr Hilarie Fredrickson would like him to take pantoprazole 40 mg once daily and famotidine 20 mg twice daily as needed for reflux. Patient verbalizes understanding of these instructions.

## 2018-07-23 ENCOUNTER — Other Ambulatory Visit (INDEPENDENT_AMBULATORY_CARE_PROVIDER_SITE_OTHER): Payer: Medicare Other

## 2018-07-23 DIAGNOSIS — K265 Chronic or unspecified duodenal ulcer with perforation: Secondary | ICD-10-CM

## 2018-07-23 LAB — CREATININE, SERUM: Creatinine, Ser: 0.72 mg/dL (ref 0.40–1.50)

## 2018-07-23 LAB — BUN: BUN: 8 mg/dL (ref 6–23)

## 2018-07-30 ENCOUNTER — Encounter (INDEPENDENT_AMBULATORY_CARE_PROVIDER_SITE_OTHER): Payer: Self-pay

## 2018-07-30 ENCOUNTER — Ambulatory Visit (INDEPENDENT_AMBULATORY_CARE_PROVIDER_SITE_OTHER)
Admission: RE | Admit: 2018-07-30 | Discharge: 2018-07-30 | Disposition: A | Discharge status: Home / Self Care | Payer: Medicare Other | Source: Ambulatory Visit | Attending: Internal Medicine | Admitting: Internal Medicine

## 2018-07-30 DIAGNOSIS — K265 Chronic or unspecified duodenal ulcer with perforation: Secondary | ICD-10-CM

## 2018-07-30 MED ORDER — IOPAMIDOL (ISOVUE-300) INJECTION 61%
80.0000 mL | Freq: Once | INTRAVENOUS | Status: AC | PRN
  Administered 2018-07-30: 80 mL via INTRAVENOUS

## 2018-08-10 ENCOUNTER — Ambulatory Visit (INDEPENDENT_AMBULATORY_CARE_PROVIDER_SITE_OTHER): Payer: Medicare Other | Admitting: Adult Health

## 2018-08-10 ENCOUNTER — Ambulatory Visit (INDEPENDENT_AMBULATORY_CARE_PROVIDER_SITE_OTHER)
Admission: RE | Admit: 2018-08-10 | Discharge: 2018-08-10 | Disposition: A | Discharge status: Home / Self Care | Payer: Medicare Other | Source: Ambulatory Visit | Attending: Adult Health | Admitting: Adult Health

## 2018-08-10 ENCOUNTER — Encounter: Payer: Self-pay | Admitting: Adult Health

## 2018-08-10 VITALS — BP 122/64 | HR 86 | Ht 64.5 in | Wt 122.0 lb

## 2018-08-10 DIAGNOSIS — Z760 Encounter for issue of repeat prescription: Secondary | ICD-10-CM | POA: Diagnosis not present

## 2018-08-10 DIAGNOSIS — J432 Centrilobular emphysema: Secondary | ICD-10-CM

## 2018-08-10 DIAGNOSIS — K279 Peptic ulcer, site unspecified, unspecified as acute or chronic, without hemorrhage or perforation: Secondary | ICD-10-CM

## 2018-08-10 DIAGNOSIS — F17200 Nicotine dependence, unspecified, uncomplicated: Secondary | ICD-10-CM

## 2018-08-10 DIAGNOSIS — IMO0001 Reserved for inherently not codable concepts without codable children: Secondary | ICD-10-CM

## 2018-08-10 DIAGNOSIS — J90 Pleural effusion, not elsewhere classified: Secondary | ICD-10-CM

## 2018-08-10 MED ORDER — ALBUTEROL SULFATE HFA 108 (90 BASE) MCG/ACT IN AERS: 2.0000 | INHALATION_SPRAY | RESPIRATORY_TRACT | 3 refills | Status: DC | PRN

## 2018-08-10 NOTE — Patient Instructions (Signed)
Your number 1 goal is to quit smoking .  Albuterol Inhaler 2 puffs every 4hr as needed wheezing /shortness of breath-this is your rescue/emergency inhaler . Chest xray today .  Follow up with Dr. [name]  Or Harmonie Verrastro NP in 6 months and As needed
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
## 2018-08-13 DIAGNOSIS — Z87891 Personal history of nicotine dependence: Secondary | ICD-10-CM | POA: Insufficient documentation

## 2018-08-13 DIAGNOSIS — J90 Pleural effusion, not elsewhere classified: Secondary | ICD-10-CM | POA: Insufficient documentation

## 2018-08-13 DIAGNOSIS — F17200 Nicotine dependence, unspecified, uncomplicated: Secondary | ICD-10-CM | POA: Insufficient documentation

## 2018-08-13 NOTE — Assessment & Plan Note (Signed)
Hospital admission June 2019.  Chest x-ray showed bilateral small effusions with bibasilar atelectasis.  Repeat chest x-ray today

## 2018-08-13 NOTE — Assessment & Plan Note (Signed)
Continue follow up with GI

## 2018-08-13 NOTE — Assessment & Plan Note (Signed)
Smoking cessation discussed in detail 

## 2018-08-13 NOTE — Assessment & Plan Note (Signed)
Mild COPD with emphysema-patient appears to be doing well. Encouraged on smoking cessation  Plan  Patient Instructions  Your number 1 goal is to quit smoking .  Albuterol Inhaler 2 puffs every 4hr as needed wheezing /shortness of breath-this is your rescue/emergency inhaler . Chest xray today .  Follow up with Dr. [name]  Or Rhapsody Wolven NP in 6 months and As needed

## 2018-08-17 ENCOUNTER — Telehealth: Payer: Self-pay | Admitting: Pulmonary Disease

## 2018-08-17 NOTE — Progress Notes (Signed)
See 08/16/18 PN

## 2018-08-17 NOTE — Telephone Encounter (Signed)
Parrett, Fonnie Mu, NP sent to Rinaldo Ratel, CMA        Pleural effusions resolved  Scarring is stable  Cont w/ ov rec  Please contact office for sooner follow up if symptoms do not improve or worsen or seek emergency care    Spoke with pt and notified of results per Tammy Parrett,NP. Pt verbalized understanding and denied any questions.

## 2018-08-21 ENCOUNTER — Encounter (HOSPITAL_COMMUNITY)
Admission: RE | Disposition: A | Discharge status: Home / Self Care | Payer: Self-pay | Source: Ambulatory Visit | Attending: Internal Medicine

## 2018-08-21 ENCOUNTER — Ambulatory Visit (HOSPITAL_COMMUNITY)
Admission: RE | Admit: 2018-08-21 | Discharge: 2018-08-21 | Disposition: A | Discharge status: Home / Self Care | Payer: Medicare Other | Source: Ambulatory Visit | Attending: Internal Medicine | Admitting: Internal Medicine

## 2018-08-21 ENCOUNTER — Ambulatory Visit (HOSPITAL_COMMUNITY): Payer: Medicare Other | Admitting: Anesthesiology

## 2018-08-21 ENCOUNTER — Other Ambulatory Visit: Payer: Self-pay

## 2018-08-21 ENCOUNTER — Encounter (HOSPITAL_COMMUNITY): Payer: Self-pay | Admitting: *Deleted

## 2018-08-21 DIAGNOSIS — Z86010 Personal history of colonic polyps: Secondary | ICD-10-CM | POA: Diagnosis not present

## 2018-08-21 DIAGNOSIS — F17210 Nicotine dependence, cigarettes, uncomplicated: Secondary | ICD-10-CM | POA: Diagnosis not present

## 2018-08-21 DIAGNOSIS — Z8711 Personal history of peptic ulcer disease: Secondary | ICD-10-CM | POA: Insufficient documentation

## 2018-08-21 DIAGNOSIS — Z8719 Personal history of other diseases of the digestive system: Secondary | ICD-10-CM | POA: Diagnosis not present

## 2018-08-21 DIAGNOSIS — K220 Achalasia of cardia: Secondary | ICD-10-CM | POA: Diagnosis present

## 2018-08-21 DIAGNOSIS — X58XXXA Exposure to other specified factors, initial encounter: Secondary | ICD-10-CM | POA: Diagnosis not present

## 2018-08-21 DIAGNOSIS — J449 Chronic obstructive pulmonary disease, unspecified: Secondary | ICD-10-CM | POA: Insufficient documentation

## 2018-08-21 DIAGNOSIS — K228 Other specified diseases of esophagus: Secondary | ICD-10-CM | POA: Insufficient documentation

## 2018-08-21 DIAGNOSIS — K265 Chronic or unspecified duodenal ulcer with perforation: Secondary | ICD-10-CM

## 2018-08-21 DIAGNOSIS — R1310 Dysphagia, unspecified: Secondary | ICD-10-CM | POA: Diagnosis not present

## 2018-08-21 DIAGNOSIS — K449 Diaphragmatic hernia without obstruction or gangrene: Secondary | ICD-10-CM | POA: Diagnosis not present

## 2018-08-21 DIAGNOSIS — T18128A Food in esophagus causing other injury, initial encounter: Secondary | ICD-10-CM | POA: Diagnosis not present

## 2018-08-21 HISTORY — PX: ESOPHAGOGASTRODUODENOSCOPY (EGD) WITH PROPOFOL: SHX5813

## 2018-08-21 HISTORY — PX: BIOPSY: SHX5522

## 2018-08-21 HISTORY — PX: BOTOX INJECTION: SHX5754

## 2018-08-21 SURGERY — ESOPHAGOGASTRODUODENOSCOPY (EGD) WITH PROPOFOL
Anesthesia: Monitor Anesthesia Care

## 2018-08-21 MED ORDER — LACTATED RINGERS IV SOLN
INTRAVENOUS | Status: DC
  Administered 2018-08-21: 1000 mL via INTRAVENOUS

## 2018-08-21 MED ORDER — SODIUM CHLORIDE 0.9 % IV SOLN: INTRAVENOUS | Status: DC

## 2018-08-21 MED ORDER — MIDAZOLAM HCL 2 MG/2ML IJ SOLN
INTRAMUSCULAR | Status: AC
  Filled 2018-08-21: qty 2

## 2018-08-21 MED ORDER — MIDAZOLAM HCL 5 MG/5ML IJ SOLN
INTRAMUSCULAR | Status: DC | PRN
  Administered 2018-08-21: 2 mg via INTRAVENOUS

## 2018-08-21 MED ORDER — SODIUM CHLORIDE 0.9 % IJ SOLN
INTRAMUSCULAR | Status: AC
  Filled 2018-08-21: qty 10

## 2018-08-21 MED ORDER — PROPOFOL 500 MG/50ML IV EMUL
INTRAVENOUS | Status: DC | PRN
  Administered 2018-08-21: 125 ug/kg/min via INTRAVENOUS

## 2018-08-21 MED ORDER — ONDANSETRON HCL 4 MG/2ML IJ SOLN
INTRAMUSCULAR | Status: DC | PRN
  Administered 2018-08-21: 4 mg via INTRAVENOUS

## 2018-08-21 MED ORDER — PROPOFOL 10 MG/ML IV BOLUS
INTRAVENOUS | Status: AC
  Filled 2018-08-21: qty 60

## 2018-08-21 MED ORDER — SODIUM CHLORIDE 0.9 % IJ SOLN
INTRAMUSCULAR | Status: DC | PRN
  Administered 2018-08-21: 4 mL via SUBMUCOSAL

## 2018-08-21 MED ORDER — PROPOFOL 500 MG/50ML IV EMUL
INTRAVENOUS | Status: DC | PRN
  Administered 2018-08-21: 50 mg via INTRAVENOUS

## 2018-08-21 MED ORDER — ONABOTULINUMTOXINA 100 UNITS IJ SOLR
INTRAMUSCULAR | Status: AC
  Filled 2018-08-21: qty 100

## 2018-08-21 SURGICAL SUPPLY — 14 items

## 2018-08-21 NOTE — Discharge Instructions (Signed)

## 2018-08-21 NOTE — Op Note (Signed)
[facility] Patient Name: Raymond Jacobs Procedure Date: 08/21/2018 MRN: [id number] Attending MD: Jerene Bears , MD Date of Birth: 27-Oct-1950 CSN: [id number] Age: 68 Admit Type: Outpatient Procedure:                Upper GI endoscopy Indications:              Dysphagia, Achalasia, For botulinum toxin injection                            of achalasia Providers:                Lajuan Lines. Hilarie Fredrickson, MD, Cleda Daub, RN, William Dalton, Technician Referring MD:             Clent Demark Medicines:                Monitored Anesthesia Care Complications:            No immediate complications. Estimated Blood Loss:     Estimated blood loss was minimal. Procedure:                Pre-Anesthesia Assessment:                           - Prior to the procedure, a History and Physical                            was performed, and patient medications and                            allergies were reviewed. The patient's tolerance of                            previous anesthesia was also reviewed. The risks                            and benefits of the procedure and the sedation                            options and risks were discussed with the patient.                            All questions were answered, and informed consent                            was obtained. Prior Anticoagulants: The patient has                            taken no previous anticoagulant or antiplatelet                            agents. ASA Grade Assessment: II - A patient with  mild systemic disease. After reviewing the risks                            and benefits, the patient was deemed in                            satisfactory condition to undergo the procedure.                           After obtaining informed consent, the endoscope was                            passed under direct vision. Throughout the                            procedure,  the patient's blood pressure, pulse, and                            oxygen saturations were monitored continuously. The                            GIF-H190 ([id number]) Olympus Adult Endoscope was                            introduced through the mouth, and advanced to the                            prepyloric region, stomach. The upper GI endoscopy                            was technically difficult and complex due to                            abnormal anatomy. The patient tolerated the                            procedure well. Scope In: Scope Out: Findings:      The lumen of the esophagus was severely dilated and tortuous.      Fluid and food debris was found in the entire esophagus. Considerable       time spent removing fluid/food contents as well as lavaging clearing of       esophageal fluid and debris to the greatest extent possible. There was       still retained solid particulate matter which was unable to be aspirated       via the suction channel on the upper endoscope. This limited complete       visualization of the esophageal mucosa in these areas.      No appreciable esophageal motility was noted. In addition, a hypertonic       lower esophageal sphincter was found at 40 cm from the incisors. There       was moderate resistance to endoscope advancement into the stomach. The       Z-line appeared regular. There were no mass lesions seen. The       gastroesophageal junction and cardia were  normal on retroflexed view.       Area at the lower esophageal sphincter was successfully injected with       100 units botulinum toxin (performed in 4 quadrants with 25 units in       each injection).      The entire examined stomach was normal. Biopsies were taken with a cold       forceps for histology and Helicobacter pylori testing (given history of       peptic/duodenal ulcer disease.      An examination of the duodenum was not performed due to looping of the       upper endoscope  in the esophagus the setting of achalasia. Esophageal       looping prevented advancing the scope past the pylorus. Impression:               - Massively dilated esophagus with fluid and food.                            Aspiration via the suction channel and lavage                            performed.                           - Achalasia. LES injected with botulinum toxin.                           - Normal stomach. Biopsied.                           - Duodenum not examined as above. Moderate Sedation:      N/A Recommendation:           - Patient has a contact number available for                            emergencies. The signs and symptoms of potential                            delayed complications were discussed with the                            patient. Return to normal activities tomorrow.                            Written discharge instructions were provided to the                            patient.                           - Soft diet.                           - Continue present medications.                           - Await pathology results.                           -  Surgical referral for [facility] myotomy/definitive                            surgical management for achalasia. Procedure Code(s):        --- Professional ---                           [id number], [phone], Esophagogastroduodenoscopy, flexible,                            transoral; with directed submucosal injection(s),                            any substance                           62263, 1, Esophagogastroduodenoscopy, flexible,                            transoral; with biopsy, single or multiple Diagnosis Code(s):        --- Professional ---                           K22.8, Other specified diseases of esophagus                           T18.128A, Food in esophagus causing other injury,                            initial encounter                           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 All rights reserved. The codes documented in this report are preliminary and upon coder review may  be revised to meet current compliance requirements. Jerene Bears, MD 08/21/2018 10:13:34 AM This report has been signed electronically. Number of Addenda: 0

## 2018-08-21 NOTE — Anesthesia Preprocedure Evaluation (Signed)
Anesthesia Evaluation    Airway Mallampati: II  TM Distance: >3 FB Neck ROM: Full    Dental no notable dental hx.    Pulmonary COPD, Current Smoker,    Pulmonary exam normal breath sounds clear to auscultation       Cardiovascular Normal cardiovascular exam Rhythm:Regular Rate:Normal     Neuro/Psych    GI/Hepatic   Endo/Other    Renal/GU      Musculoskeletal   Abdominal   Peds  Hematology   Anesthesia Other Findings   Reproductive/Obstetrics                             Anesthesia Physical Anesthesia Plan  ASA: II  Anesthesia Plan: MAC   Post-op Pain Management:    Induction: Intravenous  PONV Risk Score and Plan: Treatment may vary due to age or medical condition  Airway Management Planned: Nasal Cannula  Additional Equipment:   Intra-op Plan:   Post-operative Plan:   Informed Consent: I have reviewed the patients History and Physical, chart, labs and discussed the procedure including the risks, benefits and alternatives for the proposed anesthesia with the patient or authorized representative who has indicated his/her understanding and acceptance.   Dental advisory given  Plan Discussed with: CRNA  Anesthesia Plan Comments:         Anesthesia Quick Evaluation
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
## 2018-08-21 NOTE — Transfer of Care (Signed)
Immediate Anesthesia Transfer of Care Note  Patient: Raymond Jacobs  Procedure(s) Performed: Procedure(s): ESOPHAGOGASTRODUODENOSCOPY (EGD) WITH PROPOFOL (N/A) BOTOX INJECTION (N/A)  Patient Location: PACU  Anesthesia Type:MAC  Level of Consciousness:  sedated, patient cooperative and responds to stimulation  Airway & Oxygen Therapy:Patient Spontanous Breathing and Patient connected to face mask oxgen  Post-op Assessment:  Report given to PACU RN and Post -op Vital signs reviewed and stable  Post vital signs:  Reviewed and stable  Last Vitals:  Vitals:   08/21/18 0802 08/21/18 1007  BP: 94/60 96/69  Pulse: 72 71  Resp: 10 20  Temp: 36.5 C 36.4 C  SpO2: 32% 02%    Complications: No apparent anesthesia complications

## 2018-08-21 NOTE — Anesthesia Postprocedure Evaluation (Signed)
Anesthesia Post Note  Patient: Raymond Jacobs  Procedure(s) Performed: ESOPHAGOGASTRODUODENOSCOPY (EGD) WITH PROPOFOL (N/A ) BOTOX INJECTION (N/A ) SUBMUCOSAL INJECTION BIOPSY     Patient location during evaluation: Endoscopy Anesthesia Type: MAC Level of consciousness: awake and alert Pain management: pain level controlled Vital Signs Assessment: post-procedure vital signs reviewed and stable Respiratory status: spontaneous breathing, nonlabored ventilation and respiratory function stable Cardiovascular status: stable and blood pressure returned to baseline Postop Assessment: no apparent nausea or vomiting Anesthetic complications: no    Last Vitals:  Vitals:   08/21/18 1025 08/21/18 1027  BP:  101/67  Pulse: 65 67  Resp: 14 16  Temp:    SpO2: 98% 96%    Last Pain:  Vitals:   08/21/18 1025  TempSrc:   PainSc: 0-No pain                 Lynda Rainwater

## 2018-08-23 ENCOUNTER — Encounter (HOSPITAL_COMMUNITY): Payer: Self-pay | Admitting: Internal Medicine

## 2018-08-23 ENCOUNTER — Encounter: Payer: Self-pay | Admitting: Internal Medicine

## 2018-11-21 ENCOUNTER — Telehealth: Payer: Self-pay | Admitting: Internal Medicine

## 2018-11-21 NOTE — Telephone Encounter (Signed)
Left message for pt to call back  °

## 2018-11-21 NOTE — Telephone Encounter (Signed)
Pt has a referral for [facility] endo with botox-propofol.  Please call pt to sched.

## 2018-11-21 NOTE — Telephone Encounter (Signed)
Pt requesting to come in and discuss treatment for his achalasia. Pt scheduled to see Dr. Hilarie Fredrickson 11/30/18@3 :45pm, pt aware of appt.

## 2018-11-22 ENCOUNTER — Encounter: Payer: Self-pay | Admitting: *Deleted

## 2018-11-30 ENCOUNTER — Ambulatory Visit (INDEPENDENT_AMBULATORY_CARE_PROVIDER_SITE_OTHER): Payer: Medicare Other | Admitting: Internal Medicine

## 2018-11-30 ENCOUNTER — Encounter: Payer: Self-pay | Admitting: Internal Medicine

## 2018-11-30 VITALS — BP 112/60 | HR 88 | Ht 64.5 in | Wt 123.8 lb

## 2018-11-30 DIAGNOSIS — K220 Achalasia of cardia: Secondary | ICD-10-CM

## 2018-11-30 NOTE — Progress Notes (Signed)
   Subjective:    Patient ID: Raymond Jacobs, male    DOB: September 09, 1951, 68 y.o.   MRN: [id number]  HPI Raymond Jacobs is a 68 year old male with a past medical history of achalasia, history of peptic ulcer disease complicated by perforation in June 2019, history of emphysema who is here for follow-up.  He is here alone today.  He came for repeat upper endoscopy with Botox injection to the LES for treatment of his achalasia.  This was performed on 08/21/2018.  This showed a severely dilated and tortuous esophagus.  There was food in fluid debris in the esophagus.  There was no appreciable esophageal motility and a hypertonic esophageal sphincter at 40 cm.  This was injected with 100 units of botulinum toxin.  The entire stomach appeared normal.  Reports that his swallowing difficulty improved with Botox injection.  Right now he feels that the effect of the Botox remains in place.  He still is very careful with what he eats and tries to remain upright after eating.  He occasionally has left sided chest discomfort but denies chest pain and shortness of breath today.  No abdominal pain.  Regular bowel movements.   Review of Systems As per HPI, otherwise negative  Current Medications, Allergies, Past Medical History, Past Surgical History, Family History and Social History were reviewed in [employer] record.     Objective:   Physical Exam BP 112/60   Pulse 88   Ht 5' 4.5" (1.638 m)   Wt 123 lb 12.8 oz (56.2 kg)   BMI 20.92 kg/m  Constitutional: Well-developed and well-nourished. No distress. HEENT: Normocephalic and atraumatic.  Conjunctivae are normal.  No scleral icterus. Neck: Neck supple. Trachea midline. Cardiovascular: Normal rate, regular rhythm and intact distal pulses.  Pulmonary/chest: Effort normal and breath sounds normal. No wheezing, rales or rhonchi. Abdominal: Soft, nontender, nondistended. Bowel sounds active throughout.  Extremities: no clubbing,  cyanosis, or edema Neurological: Alert and oriented to person place and time. Skin: Skin is warm and dry. Psychiatric: Normal mood and affect. Behavior is normal.        Assessment & Plan:  68 year old male with a past medical history of achalasia, history of peptic ulcer disease complicated by perforation in June 2019, history of emphysema who is here for follow-up.   1.  Achalasia --we discussed how Botox is a temporary intervention.  He is interested in a more permanent solution.  We discussed surgical Heller myotomy, POEM, and pneumatic dilation.  After our discussion he is interested in proceeding with pneumatic dilation.  I will schedule him an appointment with Raymond Jacobs to discuss pneumatic dilation.  I explained that she will discuss the procedure and determine if he is a good candidate for pneumatic dilation.  In the interim continue soft diet.  He will also continue famotidine 20 mg twice daily.  2.  Colon cancer screening --colonoscopy performed on 08/21/2012.  2 distal hyperplastic polyps.  Repeat colonoscopy recommended October 2023  15 minutes spent with the patient today. Greater than 50% was spent in counseling and coordination of care with the patient

## 2018-11-30 NOTE — Patient Instructions (Addendum)
You have been scheduled to see Dr Silverio Decamp on 12/03/2018 at 10:00 am to discuss pneumatic dilation.  Continue famotidine 20 mg twice daily.  If you are age 68 or older, your body mass index should be between 23-30. Your Body mass index is 20.92 kg/m. If this is out of the aforementioned range listed, please consider follow up with your Primary Care Provider.  If you are age 52 or younger, your body mass index should be between 19-25. Your Body mass index is 20.92 kg/m. If this is out of the aformentioned range listed, please consider follow up with your Primary Care Provider.

## 2018-12-03 ENCOUNTER — Encounter: Payer: Self-pay | Admitting: Gastroenterology

## 2018-12-03 ENCOUNTER — Ambulatory Visit (INDEPENDENT_AMBULATORY_CARE_PROVIDER_SITE_OTHER): Payer: Medicare Other | Admitting: Gastroenterology

## 2018-12-03 ENCOUNTER — Telehealth: Payer: Self-pay | Admitting: *Deleted

## 2018-12-03 VITALS — BP 108/70 | HR 75 | Ht 64.5 in | Wt 126.0 lb

## 2018-12-03 DIAGNOSIS — R1310 Dysphagia, unspecified: Secondary | ICD-10-CM | POA: Diagnosis not present

## 2018-12-03 DIAGNOSIS — K220 Achalasia of cardia: Secondary | ICD-10-CM | POA: Diagnosis not present

## 2018-12-03 NOTE — Progress Notes (Signed)
Raymond Jacobs    [id number]    12/01/1950  Primary Care Physician:Gomez, Lesli Albee, PA-C  Referring Physician: Clent Demark, PA-C No address on file  Chief complaint: Achalasia   HPI:  68 year old very pleasant man here to discuss further management of achalasia, referred by Dr. Hilarie Fredrickson.  He was diagnosed with achalasia in 2013.  He underwent EGD October 2013 showed moderate to severe only dilated esophagus, fluid-filled mild inflammatory mucosal changes, biopsies showed chronically inflamed squamous mucosa negative for Barrett's or dysplasia.  He was referred for [facility] myotomy but he never followed through with appointment.   He was hospitalized with perforated duodenal ulcer June 2019.  He was treated non-operatively with percutaneous drainage of fluid collection by IR.  He underwent EGD with Botox injection at EG junction August 21, 2018 by Dr. Hilarie Fredrickson. Reports improvement of swallowing after Botox injection and he is tolerating oral intake well.  Weight is stable. Denies any nausea, vomiting, abdominal pain, melena or bright red blood per rectum  He was born in [place], [place] and has traveled to [place] on few occasions when he was an infant and a toddler. His family immigrated to [place] when he was 49.  He was always small built, his brother is much taller and bigger built than him.  He does not recall having any difficulty with swallowing until recently in the past few years.   Outpatient Encounter Medications as of 12/03/2018  Medication Sig  . acetaminophen (TYLENOL) 500 MG tablet Take 1,000 mg by mouth every 6 (six) hours as needed for moderate pain or headache.   . B Complex-C-Folic Acid (SUPER B-COMPLEX/VIT C/FA) TABS Take 1 tablet by mouth 4 (four) times a week.   . ECHINACEA PO Take 2 capsules by mouth 4 (four) times a week.   . famotidine (PEPCID) 20 MG tablet Take 1 tablet (20 mg total) by mouth 2 (two) times daily.  . Nutritional  Supplements (ENSURE MAX PROTEIN) LIQD Take 1 each by mouth 3 (three) times daily. Please dispense 90 bottles per month  . Omega-3 Fatty Acids (FISH OIL PO) Take 1 capsule by mouth daily.   . vitamin A 8000 UNIT capsule Take 8,000 Units by mouth 4 (four) times a week.  Marland Kitchen albuterol (PROVENTIL HFA;VENTOLIN HFA) 108 (90 Base) MCG/ACT inhaler Inhale 2 puffs into the lungs every 4 (four) hours as needed for wheezing or shortness of breath.   No facility-administered encounter medications on file as of 12/03/2018.     Allergies as of 12/03/2018 - Review Complete 12/03/2018  Allergen Reaction Noted  . Asa [aspirin] Other (See Comments) 03/30/2018  . Other Swelling and Other (See Comments) 03/30/2018    Past Medical History:  Diagnosis Date  . Achalasia   . Aortic atherosclerosis ([place]) 04/08/2018   CT scan  . Duodenal ulcer    duodenal bulb  . Hiatal hernia   . Hyperplastic colon polyp   . Pleural effusion   . Stomach ulcer     Past Surgical History:  Procedure Laterality Date  . BIOPSY  08/21/2018   Procedure: BIOPSY;  Surgeon: Jerene Bears, MD;  Location: [facility];  Service: Gastroenterology;;  . Lum Keas INJECTION N/A 08/21/2018   Procedure: BOTOX INJECTION;  Surgeon: Jerene Bears, MD;  Location: [facility];  Service: Gastroenterology;  Laterality: N/A;  . ESOPHAGOGASTRODUODENOSCOPY (EGD) WITH PROPOFOL N/A 08/21/2018   Procedure: ESOPHAGOGASTRODUODENOSCOPY (EGD) WITH PROPOFOL;  Surgeon: Jerene Bears, MD;  Location: [facility];  Service: Gastroenterology;  Laterality: N/A;  . IR RADIOLOGIST EVAL & MGMT  04/24/2018    Family History  Problem Relation Age of Onset  . Colon cancer Brother   . Diabetes Brother   . Diabetes Maternal Aunt     Social History   Socioeconomic History  . Marital status: Divorced    Spouse name: Not on file  . Number of children: Not on file  . Years of education: Not on file  . Highest education level: Not on file  Occupational History  .  Not on file  Social Needs  . Financial resource strain: Not on file  . Food insecurity:    Worry: Not on file    Inability: Not on file  . Transportation needs:    Medical: Not on file    Non-medical: Not on file  Tobacco Use  . Smoking status: Current Every Day Smoker    Packs/day: 1.00    Types: Cigarettes  . Smokeless tobacco: Never Used  Substance and Sexual Activity  . Alcohol use: Yes    Comment: 1 beer and 3 shots vodka daily  . Drug use: Yes    Types: Marijuana  . Sexual activity: Not on file  Lifestyle  . Physical activity:    Days per week: Not on file    Minutes per session: Not on file  . Stress: Not on file  Relationships  . Social connections:    Talks on phone: Not on file    Gets together: Not on file    Attends religious service: Not on file    Active member of club or organization: Not on file    Attends meetings of clubs or organizations: Not on file    Relationship status: Not on file  . Intimate partner violence:    Fear of current or ex partner: Not on file    Emotionally abused: Not on file    Physically abused: Not on file    Forced sexual activity: Not on file  Other Topics Concern  . Not on file  Social History Narrative  . Not on file      Review of systems: Review of Systems  Constitutional: Negative for fever and chills.  HENT: Negative.   Eyes: Negative for blurred vision.  Respiratory: Negative for cough, shortness of breath and wheezing.   Cardiovascular: Negative for chest pain and palpitations.  Gastrointestinal: as per HPI Genitourinary: Negative for dysuria, urgency, frequency and hematuria.  Musculoskeletal: Negative for myalgias, back pain and joint pain.  Skin: Negative for itching and rash.  Neurological: Negative for dizziness, tremors, focal weakness, seizures and loss of consciousness.  Endo/Heme/Allergies: Positive for seasonal allergies.  Psychiatric/Behavioral: Negative for depression, suicidal ideas and  hallucinations.  All other systems reviewed and are negative.   Physical Exam: Vitals:   12/03/18 1008  BP: 108/70  Pulse: 75   Body mass index is 21.29 kg/m. Gen:      No acute distress HEENT:  EOMI, sclera anicteric Neck:     No masses; no thyromegaly Lungs:    Clear to auscultation bilaterally; normal respiratory effort CV:         Regular rate and rhythm; no murmurs Abd:      + bowel sounds; soft, non-tender; no palpable masses, no distension Ext:    No edema; adequate peripheral perfusion Skin:      Warm and dry; no rash Neuro: alert and oriented x 3 Psych: normal mood and affect  Data Reviewed:  Reviewed labs, radiology imaging, old records and pertinent past GI work up   Assessment and Plan/Recommendations:  68 year old male with longstanding history of achalasia with severe esophageal dilation. Perforated peptic ulcer June 2019 Significant improvement of dysphagia after Botox injection Patient is here to discuss pneumatic dilation for further management as improvement from Botox may not last longer than 6-12 months.  He is not interested in pursuing POEM or Heller myotomy. Discussed in detail potential risks associated with pneumatic balloon dilation including bleeding, infection, esophageal tears and perforation.  Patient is willing to proceed.  We will plan for EGD with pneumatic balloon dilation in April 2020 as it would be close to 6 months since Botox injection.  ?  Chagas disease given history of his early childhood in [place].  No evidence of cardiomyopathy on echocardiogram June 2019.  Can consider checking PCR for Trypanosoma Cruzi.  We will check if it is covered by his insurance.  The risks and benefits as well as alternatives of endoscopic procedure(s) have been discussed and reviewed. All questions answered. The patient agrees to proceed.   Damaris Hippo , MD [phone]    CC: Clent Demark, PA-C

## 2018-12-03 NOTE — Patient Instructions (Addendum)
We have scheduled you for a hospital procedure on 02/21/2019 at 8:30am to arrive at 7am, separate instructions have been given  You will have an xray done 2 hours after your procedure in radiology that day on 02/21/2019   If you are age 68 or older, your body mass index should be between 23-30. Your Body mass index is 21.29 kg/m. If this is out of the aforementioned range listed, please consider follow up with your Primary Care Provider.  If you are age 41 or younger, your body mass index should be between 19-25. Your Body mass index is 21.29 kg/m. If this is out of the aformentioned range listed, please consider follow up with your Primary Care Provider.    I appreciate the  opportunity to care for you  Thank You   Harl Bowie , MD

## 2018-12-03 NOTE — Telephone Encounter (Signed)
Called patient to infrom he is to be NPO 12 hours before procedure   No Answer

## 2018-12-03 NOTE — Telephone Encounter (Signed)
Also needs lab test that Dr Silverio Decamp has put in orders

## 2019-01-16 ENCOUNTER — Telehealth: Payer: Self-pay | Admitting: Gastroenterology

## 2019-01-16 NOTE — Telephone Encounter (Signed)
Pt would like to reschedule his procedure at [facility] scheduled February 21, 2019.

## 2019-01-16 NOTE — Telephone Encounter (Signed)
Ok to cancel the procedure due to COVID-19 epidemic. Please place a recall reminder for May 2020 to contact patient to reschedule. Thanks

## 2019-01-16 NOTE — Telephone Encounter (Signed)
  FYI He is scheduled for a pneumatic balloon dilation. I can cancel but cannot reschedule at this point.  I have spoken to the patient. He is aware of this and still wants to cancel. I have taken care of this.

## 2019-02-15 NOTE — Telephone Encounter (Signed)
Do you want me to try to get this scheduled?

## 2019-02-18 NOTE — Telephone Encounter (Signed)
Left message to call back. Asking how is he doing. Any problems.

## 2019-02-18 NOTE — Telephone Encounter (Signed)
Lets reevaluate in 2 weeks, hospital endoscopy is still operating at limited capacity. Can you please schedule for televisit next available to check how he is doing. Thanks

## 2019-02-21 ENCOUNTER — Ambulatory Visit (HOSPITAL_COMMUNITY): Admission: RE | Admit: 2019-02-21 | Payer: Medicare Other | Source: Home / Self Care | Admitting: Gastroenterology

## 2019-02-21 ENCOUNTER — Ambulatory Visit (HOSPITAL_COMMUNITY): Payer: Medicare Other

## 2019-02-21 ENCOUNTER — Encounter (HOSPITAL_COMMUNITY): Admission: RE | Payer: Self-pay | Source: Home / Self Care

## 2019-02-21 SURGERY — EGD (ESOPHAGOGASTRODUODENOSCOPY)
Anesthesia: General

## 2019-02-27 ENCOUNTER — Other Ambulatory Visit: Payer: Self-pay

## 2019-02-27 DIAGNOSIS — K220 Achalasia of cardia: Secondary | ICD-10-CM

## 2019-02-27 DIAGNOSIS — K2289 Other specified disease of esophagus: Secondary | ICD-10-CM

## 2019-02-27 DIAGNOSIS — K228 Other specified diseases of esophagus: Secondary | ICD-10-CM

## 2019-02-27 NOTE — Telephone Encounter (Signed)
Patient notified and agrees to the date of 03/21/19. Start time will be at 12:30 pm. New instructions mailed to the patient.

## 2019-02-27 NOTE — Telephone Encounter (Signed)
Ok please check if Hospital endo will let us schedule the procedure next available

## 2019-02-27 NOTE — Telephone Encounter (Signed)
Patient says he is doing "pretty well" and he is ready to get the procedure done. He does not have any new symptoms to report. Denies any pain or choking episodes. He is eating carefully and "I don't eat a whole lot, but I eat enough." He asks if we can schedule the procedure. Please advise.

## 2019-03-13 ENCOUNTER — Telehealth: Payer: Self-pay

## 2019-03-13 ENCOUNTER — Other Ambulatory Visit: Payer: Self-pay

## 2019-03-13 NOTE — Telephone Encounter (Signed)
The patient needs to pre-screened for COVID 19 on 03/15/19 in preparation for his EGD procedure on 03/21/19. He cannot be tested on Monday due to the holiday. I have left him a message to call me back.

## 2019-03-15 ENCOUNTER — Other Ambulatory Visit (HOSPITAL_COMMUNITY): Admission: RE | Admit: 2019-03-15 | Payer: Medicare Other | Source: Ambulatory Visit

## 2019-03-15 ENCOUNTER — Other Ambulatory Visit (HOSPITAL_COMMUNITY): Payer: Medicare Other

## 2019-03-19 NOTE — Progress Notes (Signed)
Left message regarding covid testing for his outpatient procedure. Left instructions for patient to call regarding needing to come in  Today to be tested prior to his procedure.

## 2019-03-20 ENCOUNTER — Encounter (HOSPITAL_COMMUNITY): Payer: Self-pay | Admitting: Registered Nurse

## 2019-03-20 ENCOUNTER — Telehealth: Payer: Self-pay | Admitting: *Deleted

## 2019-03-20 NOTE — Progress Notes (Signed)
Raymond Flor, RN called patient to review Medical history for procedure at [facility] on 03/21/2019 and LM on VM to call back to get instructions.

## 2019-03-20 NOTE — Progress Notes (Signed)
Attempted to reach patient again regarding procedure at [facility] tomorrow with dr. Silverio Decamp. Wanting patient to come to be tested for covid preoperatively. Asked patient to call our unit [phone] or dr. Woodward Ku office [phone]. Left message on voice mail.

## 2019-03-20 NOTE — Telephone Encounter (Signed)
Call patient, left a message to return phone call.   03/21/19 EGD has to be cancelled due missed COVID-19 screening. Patient currently unaware of this.  Called patient's brother who notified this RN that the patient dropped his phone in paint and is only available via text. This RN told the brother that in order to contact the patient, staff would have to use their personal phones which is again company policy. The brother took a message from this RN and texted the patient asking the patient to get to a phone to be able to talk.

## 2019-03-20 NOTE — Telephone Encounter (Signed)
Spoke with the brother of the patient. He has been unsuccessful in contacting his brother. He has text him multiple times.  I have text the patient from my personal phone in an attempt to reach him also.  Last conversation with the patient on 02/27/19 he had agreed to the date 03/21/19 and new instructions were mailed.

## 2019-03-21 ENCOUNTER — Ambulatory Visit (HOSPITAL_COMMUNITY): Admission: RE | Admit: 2019-03-21 | Payer: Medicare Other | Source: Home / Self Care | Admitting: Gastroenterology

## 2019-03-21 ENCOUNTER — Encounter (HOSPITAL_COMMUNITY): Admission: RE | Payer: Self-pay | Source: Home / Self Care

## 2019-03-21 SURGERY — ESOPHAGOGASTRODUODENOSCOPY (EGD) WITH PROPOFOL
Anesthesia: Monitor Anesthesia Care

## 2019-03-21 NOTE — Telephone Encounter (Signed)
If patient calls back this morning we can request hospital if they will do rapid testing and still proceed with the scheduled procedure if negative.  Thank you

## 2019-04-02 ENCOUNTER — Telehealth: Payer: Self-pay

## 2019-04-02 NOTE — Telephone Encounter (Signed)
Patient calls to state he is available to have the EGD with pneumatic balloon dilation done. Apologizes for the failure to follow through with the last appointment.

## 2019-04-02 NOTE — Telephone Encounter (Signed)
Patient calling back to reschedule his procedure at the hospital.

## 2019-04-03 ENCOUNTER — Other Ambulatory Visit: Payer: Self-pay

## 2019-04-03 DIAGNOSIS — K220 Achalasia of cardia: Secondary | ICD-10-CM

## 2019-04-03 NOTE — Telephone Encounter (Signed)
Ok, please get him in week of July 20th (Mon -Thu) on any day that I don't have other cases scheduled. Thanks

## 2019-04-03 NOTE — Telephone Encounter (Signed)
Spoke with the patient.  Letter mailed with instructions for COVID testing and for the day of the procedure. 05/13/19 COVID lab 05/16/19 EGD with pneumatic dilation

## 2019-04-04 ENCOUNTER — Other Ambulatory Visit: Payer: Self-pay

## 2019-04-04 NOTE — Telephone Encounter (Signed)
Date changed to 05/14/19 at 12:30 pm for the EGD. Radiology will be available that afternoon for the DG esophagus gastrografin if needed.  Patient is notified. Understands that this will also change the COVID lab testing date. New instructions mailed to the patient.

## 2019-05-10 ENCOUNTER — Other Ambulatory Visit (HOSPITAL_COMMUNITY)
Admission: RE | Admit: 2019-05-10 | Discharge: 2019-05-10 | Disposition: A | Discharge status: Home / Self Care | Payer: Medicare Other | Source: Ambulatory Visit | Attending: Gastroenterology | Admitting: Gastroenterology

## 2019-05-10 DIAGNOSIS — Z1159 Encounter for screening for other viral diseases: Secondary | ICD-10-CM | POA: Insufficient documentation

## 2019-05-10 LAB — SARS CORONAVIRUS 2 (TAT 6-24 HRS): SARS Coronavirus 2: NEGATIVE

## 2019-05-13 ENCOUNTER — Other Ambulatory Visit (HOSPITAL_COMMUNITY): Payer: Medicare Other

## 2019-05-13 NOTE — Progress Notes (Signed)
Unable to reach Mr. Borquez by phone.

## 2019-05-13 NOTE — Progress Notes (Signed)
Unable to reach patient after two attempts. Left message on patients answer machine.

## 2019-05-14 ENCOUNTER — Ambulatory Visit (HOSPITAL_COMMUNITY): Payer: Medicare Other | Admitting: Certified Registered Nurse Anesthetist

## 2019-05-14 ENCOUNTER — Ambulatory Visit (HOSPITAL_COMMUNITY)
Admission: RE | Admit: 2019-05-14 | Discharge: 2019-05-14 | Disposition: A | Discharge status: Home / Self Care | Payer: Medicare Other | Attending: Gastroenterology | Admitting: Gastroenterology

## 2019-05-14 ENCOUNTER — Other Ambulatory Visit: Payer: Self-pay

## 2019-05-14 ENCOUNTER — Ambulatory Visit (HOSPITAL_COMMUNITY)
Admission: RE | Admit: 2019-05-14 | Discharge: 2019-05-14 | Disposition: A | Discharge status: Home / Self Care | Payer: Medicare Other | Source: Ambulatory Visit | Attending: Gastroenterology | Admitting: Gastroenterology

## 2019-05-14 ENCOUNTER — Ambulatory Visit (HOSPITAL_COMMUNITY): Payer: Medicare Other

## 2019-05-14 ENCOUNTER — Encounter (HOSPITAL_COMMUNITY): Payer: Self-pay | Admitting: *Deleted

## 2019-05-14 ENCOUNTER — Encounter (HOSPITAL_COMMUNITY)
Admission: RE | Disposition: A | Discharge status: Home / Self Care | Payer: Self-pay | Source: Home / Self Care | Attending: Gastroenterology

## 2019-05-14 DIAGNOSIS — F17210 Nicotine dependence, cigarettes, uncomplicated: Secondary | ICD-10-CM | POA: Diagnosis not present

## 2019-05-14 DIAGNOSIS — K220 Achalasia of cardia: Secondary | ICD-10-CM

## 2019-05-14 DIAGNOSIS — J449 Chronic obstructive pulmonary disease, unspecified: Secondary | ICD-10-CM | POA: Insufficient documentation

## 2019-05-14 DIAGNOSIS — R1310 Dysphagia, unspecified: Secondary | ICD-10-CM | POA: Diagnosis not present

## 2019-05-14 DIAGNOSIS — K222 Esophageal obstruction: Secondary | ICD-10-CM | POA: Diagnosis not present

## 2019-05-14 DIAGNOSIS — Z886 Allergy status to analgesic agent status: Secondary | ICD-10-CM | POA: Insufficient documentation

## 2019-05-14 DIAGNOSIS — T18128S Food in esophagus causing other injury, sequela: Secondary | ICD-10-CM

## 2019-05-14 DIAGNOSIS — T18128A Food in esophagus causing other injury, initial encounter: Secondary | ICD-10-CM | POA: Diagnosis not present

## 2019-05-14 DIAGNOSIS — Z79899 Other long term (current) drug therapy: Secondary | ICD-10-CM | POA: Diagnosis not present

## 2019-05-14 DIAGNOSIS — X58XXXA Exposure to other specified factors, initial encounter: Secondary | ICD-10-CM | POA: Insufficient documentation

## 2019-05-14 DIAGNOSIS — W44F3XA Food entering into or through a natural orifice, initial encounter: Secondary | ICD-10-CM

## 2019-05-14 HISTORY — PX: ESOPHAGOGASTRODUODENOSCOPY (EGD) WITH PROPOFOL: SHX5813

## 2019-05-14 HISTORY — PX: BALLOON DILATION: SHX5330

## 2019-05-14 HISTORY — PX: FOREIGN BODY REMOVAL: SHX962

## 2019-05-14 SURGERY — ESOPHAGOGASTRODUODENOSCOPY (EGD) WITH PROPOFOL
Anesthesia: Monitor Anesthesia Care

## 2019-05-14 MED ORDER — PROPOFOL 10 MG/ML IV BOLUS
INTRAVENOUS | Status: AC
  Filled 2019-05-14: qty 60

## 2019-05-14 MED ORDER — LACTATED RINGERS IV SOLN
INTRAVENOUS | Status: DC | PRN
  Administered 2019-05-14 (×2): via INTRAVENOUS

## 2019-05-14 MED ORDER — FENTANYL CITRATE (PF) 100 MCG/2ML IJ SOLN
INTRAMUSCULAR | Status: DC | PRN
  Administered 2019-05-14 (×2): 50 ug via INTRAVENOUS

## 2019-05-14 MED ORDER — PROPOFOL 10 MG/ML IV BOLUS
INTRAVENOUS | Status: DC | PRN
  Administered 2019-05-14: 90 mg via INTRAVENOUS

## 2019-05-14 MED ORDER — EPHEDRINE SULFATE-NACL 50-0.9 MG/10ML-% IV SOSY
PREFILLED_SYRINGE | INTRAVENOUS | Status: DC | PRN
  Administered 2019-05-14 (×2): 10 mg via INTRAVENOUS

## 2019-05-14 MED ORDER — SODIUM CHLORIDE 0.9 % IV SOLN: INTRAVENOUS | Status: DC

## 2019-05-14 MED ORDER — SUCCINYLCHOLINE CHLORIDE 20 MG/ML IJ SOLN
INTRAMUSCULAR | Status: DC | PRN
  Administered 2019-05-14: 80 mg via INTRAVENOUS

## 2019-05-14 MED ORDER — LIDOCAINE 2% (20 MG/ML) 5 ML SYRINGE
INTRAMUSCULAR | Status: DC | PRN
  Administered 2019-05-14: 60 mg via INTRAVENOUS

## 2019-05-14 MED ORDER — ONDANSETRON HCL 4 MG/2ML IJ SOLN
INTRAMUSCULAR | Status: DC | PRN
  Administered 2019-05-14: 4 mg via INTRAVENOUS

## 2019-05-14 MED ORDER — IOHEXOL 300 MG/ML  SOLN
150.0000 mL | Freq: Once | INTRAMUSCULAR | Status: AC | PRN
  Administered 2019-05-14: 125 mL via ORAL

## 2019-05-14 MED ORDER — FENTANYL CITRATE (PF) 100 MCG/2ML IJ SOLN
INTRAMUSCULAR | Status: AC
  Filled 2019-05-14: qty 2

## 2019-05-14 SURGICAL SUPPLY — 15 items

## 2019-05-14 NOTE — Op Note (Signed)
[facility] Patient Name: Raymond Jacobs Procedure Date: 05/14/2019 MRN: [id number] Attending MD: Mauri Pole , MD Date of Birth: [birth year] CSN: [id number] Age: 68 Admit Type: Inpatient Procedure:                Upper GI endoscopy Indications:              Dysphagia, Achalasia Providers:                Mauri Pole, MD, Vista Lawman, RN, Ashley Jacobs, RN, Ladona Ridgel, Technician Referring MD:              Medicines:                General Anesthesia Complications:            No immediate complications. Estimated Blood Loss:     Estimated blood loss was minimal. Procedure:                Pre-Anesthesia Assessment:                           - Prior to the procedure, a History and Physical                            was performed, and patient medications and                            allergies were reviewed. The patient's tolerance of                            previous anesthesia was also reviewed. The risks                            and benefits of the procedure and the sedation                            options and risks were discussed with the patient.                            All questions were answered, and informed consent                            was obtained. Prior Anticoagulants: The patient has                            taken no previous anticoagulant or antiplatelet                            agents. ASA Grade Assessment: III - A patient with                            severe systemic disease. After reviewing the risks  and benefits, the patient was deemed in                            satisfactory condition to undergo the procedure.                           After obtaining informed consent, the endoscope was                            passed under direct vision. Throughout the                            procedure, the patient's blood pressure, pulse, and     oxygen saturations were monitored continuously. The                            GIF-H190 ([id number]) Olympus gastroscope was                            introduced through the mouth, and advanced to the                            second part of duodenum. The upper GI endoscopy was                            technically difficult and complex due to presence                            of food. The patient tolerated the procedure well. Scope In: Scope Out: Findings:      Food was found in the entire esophagus. Removal of food was accomplished.      The lumen of the esophagus was severely dilated.      Tight GE junction with tortuosity, able to advance with minimal       pressure. A guide wire was placed, then the scope was withdrawn. Using       the wire as a guide, dilation with a 30 mm pneumatic balloon was       performed under fluoroscopic guidance. Used pediatric endoscope to       advance the balloon as unable to advance due to tortuosity at EG       junction. After confirming the position, the balloon was inflated to 15       PSI with complete effacement of the balloon waist. Pressure was held for       60 seconds before balloon deflation and withdrawal. There was no blood       on the balloon.      The stomach was normal.      The examined duodenum was normal. Impression:               - Food in the esophagus. Removal was successful.                           - Dilation in the entire esophagus.                           -  Esophageal stenosis. Dilated.                           - Normal stomach.                           - Normal examined duodenum. Moderate Sedation:      Not Applicable - Patient had care per Anesthesia. Recommendation:           - Full liquid diet today, then advance as tolerated                            to mechanical soft diet for 1 week.                           - Continue present medications.                           - No high aspirin, ibuprofen, naproxen,  or other                            non-steroidal anti-inflammatory drugs.                           - Follow up esophagogram (gastrograffin) to exclude                            any leaks                           - Follow up virtual visit, next available.                           - If persistent symptoms, will need to consider                            referral to surgery for POEM/Heller myotomy Procedure Code(s):        --- Professional ---                           [phone], Esophagogastroduodenoscopy, flexible,                            transoral; with dilation of esophagus with balloon                            (30 mm diameter or larger) (includes fluoroscopic                            guidance, when performed)                           [id number], Esophagogastroduodenoscopy, flexible,                            transoral; with removal of foreign body(s) Diagnosis Code(s):        --- Professional ---  M25.003B, Food in esophagus causing other injury,                            initial encounter                           K22.8, Other specified diseases of esophagus                           K22.2, Esophageal obstruction                           R13.10, Dysphagia, unspecified                           K22.0, Achalasia of cardia CPT copyright 2019 American Medical Association. All rights reserved. The codes documented in this report are preliminary and upon coder review may  be revised to meet current compliance requirements. Mauri Pole, MD 05/14/2019 2:15:29 PM This report has been signed electronically. Number of Addenda: 0

## 2019-05-14 NOTE — Anesthesia Procedure Notes (Signed)
Procedure Name: Intubation Performed by: Gean Maidens, CRNA Pre-anesthesia Checklist: Patient identified, Emergency Drugs available, Suction available, Patient being monitored and Timeout performed Patient Re-evaluated:Patient Re-evaluated prior to induction Oxygen Delivery Method: Circle system utilized Preoxygenation: Pre-oxygenation with 100% oxygen Induction Type: IV induction and Rapid sequence Laryngoscope Size: Mac and 4 Grade View: Grade II Tube type: Oral Tube size: 7.5 mm Number of attempts: 1 Airway Equipment and Method: Stylet Placement Confirmation: ETT inserted through vocal cords under direct vision,  positive ETCO2 and breath sounds checked- equal and bilateral Secured at: 22 cm Tube secured with: Tape Dental Injury: Teeth and Oropharynx as per pre-operative assessment

## 2019-05-14 NOTE — Transfer of Care (Signed)
Immediate Anesthesia Transfer of Care Note  Patient: Raymond Jacobs  Procedure(s) Performed: ESOPHAGOGASTRODUODENOSCOPY (EGD) WITH PROPOFOL (N/A ) BALLOON DILATION (N/A ) FOREIGN BODY REMOVAL  Patient Location: PACU  Anesthesia Type:General  Level of Consciousness: awake, alert  and oriented  Airway & Oxygen Therapy: Patient Spontanous Breathing and Patient connected to face mask oxygen  Post-op Assessment: Report given to RN and Post -op Vital signs reviewed and stable  Post vital signs: Reviewed and stable  Last Vitals:  Vitals Value Taken Time  BP 123/69 05/14/19 1405  Temp    Pulse 74 05/14/19 1406  Resp 16 05/14/19 1406  SpO2 99 % 05/14/19 1406  Vitals shown include unvalidated device data.  Last Pain:  Vitals:   05/14/19 1118  TempSrc: Oral  PainSc: 0-No pain         Complications: No apparent anesthesia complications

## 2019-05-14 NOTE — H&P (Signed)
[facility] History and Physical   Primary Care Physician:  Clent Demark, PA-C   Reason for Procedure:   Achalasia Plan:    EGD with pneumatic balloon dilation under fluoroscopy and general anaesthesia     HPI: Nader Boys is a 68 y.o. male with achalasia and dysphagia for pneumatic balloon dilation. Denies any nausea, vomiting, abdominal pain, melena or bright red blood per rectum   Past Medical History:  Diagnosis Date  . Achalasia   . Aortic atherosclerosis ([place]) 04/08/2018   CT scan  . Duodenal ulcer    duodenal bulb  . Hiatal hernia   . Hyperplastic colon polyp   . Pleural effusion   . Stomach ulcer     Past Surgical History:  Procedure Laterality Date  . BIOPSY  08/21/2018   Procedure: BIOPSY;  Surgeon: Jerene Bears, MD;  Location: [facility];  Service: Gastroenterology;;  . Lum Keas INJECTION N/A 08/21/2018   Procedure: BOTOX INJECTION;  Surgeon: Jerene Bears, MD;  Location: [facility];  Service: Gastroenterology;  Laterality: N/A;  . ESOPHAGOGASTRODUODENOSCOPY (EGD) WITH PROPOFOL N/A 08/21/2018   Procedure: ESOPHAGOGASTRODUODENOSCOPY (EGD) WITH PROPOFOL;  Surgeon: Jerene Bears, MD;  Location: [facility];  Service: Gastroenterology;  Laterality: N/A;  . IR RADIOLOGIST EVAL & MGMT  04/24/2018    Prior to Admission medications   Medication Sig Start Date End Date Taking? Authorizing Provider  acetaminophen (TYLENOL) 500 MG tablet Take 1,000 mg by mouth daily as needed for moderate pain or headache.    Yes [provider]  albuterol (PROVENTIL HFA;VENTOLIN HFA) 108 (90 Base) MCG/ACT inhaler Inhale 2 puffs into the lungs every 4 (four) hours as needed for wheezing or shortness of breath. 08/10/18 05/10/19 Yes Parrett, Tammy S, NP  Ascorbic Acid (VITAMIN C) 500 MG CAPS Take 500 mg by mouth 2 (two) times a week.   Yes [provider]  B Complex-C-Folic Acid (SUPER B-COMPLEX/VIT C/FA) TABS Take 1 tablet by mouth See admin  instructions. Take 1 tablet by mouth 5 times per week   Yes [provider]  Cholecalciferol (DIALYVITE VITAMIN D 5000) 125 MCG (5000 UT) capsule Take 5,000 Units by mouth See admin instructions. Take 5000 units 5 to 6 days per week   Yes [provider]  diphenhydramine-acetaminophen (TYLENOL PM) 25-500 MG TABS tablet Take 2 tablets by mouth at bedtime as needed (sleep).   Yes [provider]  ECHINACEA PO Take 1,500 mg by mouth See admin instructions. Take 1500 mg by mouth 6 times per week   Yes [provider]  famotidine (PEPCID) 20 MG tablet Take 1 tablet (20 mg total) by mouth 2 (two) times daily. Patient taking differently: Take 20 mg by mouth See admin instructions. Take 20 mg daily, may take a second 20 mg dose as needed for heartburn 07/17/18  Yes Pyrtle, Lajuan Lines, MD  ferrous sulfate 325 (65 FE) MG tablet Take 325 mg by mouth See admin instructions. Take 325 mg by mouth 6 times per week   Yes [provider]  Naphazoline-Glycerin (CLEAR EYES REDNESS RELIEF OP) Place 1 drop into both eyes daily as needed (redness/irritation).   Yes [provider]  Nutritional Supplements (ENSURE MAX PROTEIN) LIQD Take 1 each by mouth 3 (three) times daily. Please dispense 90 bottles per month Patient taking differently: Take 1 each by mouth 2 (two) times a day. Please dispense 90 bottles per month 06/26/18  Yes Clent Demark, PA-C  Omega-3 Fatty Acids (FISH OIL) 500  MG CAPS Take 1,000 mg by mouth daily.   Yes [provider]  Potassium 99 MG TABS Take 99 mg by mouth 2 (two) times a week.   Yes [provider]  Vitamin A 2400 MCG (8000 UT) CAPS Take 8,000 Units by mouth See admin instructions. Take 8000 units 6 days per week   Yes [provider]    Current Facility-Administered Medications  Medication Dose Route Frequency Provider Last Rate Last Dose  . 0.9 %  sodium chloride infusion   Intravenous Continuous Mauri Pole, MD        Allergies as of 04/03/2019 - Review Complete 12/03/2018  Allergen Reaction Noted  . Asa [aspirin] Other (See Comments) 03/30/2018  . Other Swelling and Other (See Comments) 03/30/2018    Family History  Problem Relation Age of Onset  . Colon cancer Brother   . Diabetes Brother   . Diabetes Maternal Aunt     Social History   Socioeconomic History  . Marital status: Divorced    Spouse name: Not on file  . Number of children: Not on file  . Years of education: Not on file  . Highest education level: Not on file  Occupational History  . Not on file  Social Needs  . Financial resource strain: Not on file  . Food insecurity    Worry: Not on file    Inability: Not on file  . Transportation needs    Medical: Not on file    Non-medical: Not on file  Tobacco Use  . Smoking status: Current Every Day Smoker    Packs/day: 1.00    Types: Cigarettes  . Smokeless tobacco: Never Used  Substance and Sexual Activity  . Alcohol use: Yes    Comment: 1 beer and 3 shots vodka daily  . Drug use: Yes    Types: Marijuana  . Sexual activity: Not on file  Lifestyle  . Physical activity    Days per week: Not on file    Minutes per session: Not on file  . Stress: Not on file  Relationships  . Social Herbalist on phone: Not on file    Gets together: Not on file    Attends religious service: Not on file    Active member of club or organization: Not on file    Attends meetings of clubs or organizations: Not on file    Relationship status: Not on file  . Intimate partner violence    Fear of current or ex partner: Not on file    Emotionally abused: Not on file    Physically abused: Not on file    Forced sexual activity: Not on file  Other Topics Concern  . Not on file  Social History Narrative  . Not on file    Review of Systems:  All other review of systems negative except as mentioned in the HPI.  Physical Exam: Vital signs in last 24 hours: Temp:   [97.7 F (36.5 C)] 97.7 F (36.5 C) (07/21 1118) Pulse Rate:  [68] 68 (07/21 1118) Resp:  [14] 14 (07/21 1118) BP: (110)/(70) 110/70 (07/21 1118) SpO2:  [97 %] 97 % (07/21 1118) Weight:  [54.4 kg] 54.4 kg (07/21 1118)   General:   Alert,  Well-developed, well-nourished, pleasant and cooperative in NAD Lungs:  Clear throughout to auscultation.   Heart:  Regular rate and rhythm; no murmurs, clicks, rubs,  or gallops. Abdomen:  Soft, nontender and nondistended. Normal bowel sounds.  Neuro/Psych:  Alert and cooperative. Normal mood and affect. A and O x 3   K. Denzil Magnuson , MD [phone]

## 2019-05-14 NOTE — Anesthesia Postprocedure Evaluation (Signed)
Anesthesia Post Note  Patient: Deyton Ellenbecker  Procedure(s) Performed: ESOPHAGOGASTRODUODENOSCOPY (EGD) WITH PROPOFOL (N/A ) BALLOON DILATION (N/A ) FOREIGN BODY REMOVAL     Patient location during evaluation: Endoscopy Anesthesia Type: General Level of consciousness: awake and alert Pain management: pain level controlled Vital Signs Assessment: post-procedure vital signs reviewed and stable Respiratory status: spontaneous breathing, nonlabored ventilation and respiratory function stable Cardiovascular status: blood pressure returned to baseline and stable Postop Assessment: no apparent nausea or vomiting Anesthetic complications: no    Last Vitals:  Vitals:   05/14/19 1118 05/14/19 1405  BP: 110/70 123/69  Pulse: 68 77  Resp: 14 16  Temp: 36.5 C (!) 36.1 C  SpO2: 97% 98%    Last Pain:  Vitals:   05/14/19 1420  TempSrc:   PainSc: 0-No pain                 Lynda Rainwater

## 2019-05-14 NOTE — Anesthesia Preprocedure Evaluation (Signed)
Anesthesia Evaluation  Patient identified by MRN, date of birth, ID band Patient awake    Reviewed: Allergy & Precautions, NPO status , Patient's Chart, lab work & pertinent test results  Airway Mallampati: II  TM Distance: >3 FB Neck ROM: Full    Dental no notable dental hx.    Pulmonary neg pulmonary ROS, COPD, Current Smoker,    Pulmonary exam normal breath sounds clear to auscultation       Cardiovascular negative cardio ROS Normal cardiovascular exam Rhythm:Regular Rate:Normal     Neuro/Psych negative neurological ROS  negative psych ROS   GI/Hepatic negative GI ROS, Neg liver ROS,   Endo/Other  negative endocrine ROS  Renal/GU negative Renal ROS  negative genitourinary   Musculoskeletal negative musculoskeletal ROS (+)   Abdominal   Peds negative pediatric ROS (+)  Hematology negative hematology ROS (+)   Anesthesia Other Findings   Reproductive/Obstetrics negative OB ROS                             Anesthesia Physical  Anesthesia Plan  ASA: II  Anesthesia Plan: MAC   Post-op Pain Management:    Induction: Intravenous  PONV Risk Score and Plan: Treatment may vary due to age or medical condition  Airway Management Planned: Nasal Cannula  Additional Equipment:   Intra-op Plan:   Post-operative Plan:   Informed Consent: I have reviewed the patients History and Physical, chart, labs and discussed the procedure including the risks, benefits and alternatives for the proposed anesthesia with the patient or authorized representative who has indicated his/her understanding and acceptance.     Dental advisory given  Plan Discussed with: CRNA  Anesthesia Plan Comments:         Anesthesia Quick Evaluation

## 2019-05-15 ENCOUNTER — Telehealth: Payer: Self-pay

## 2019-05-15 ENCOUNTER — Encounter (HOSPITAL_COMMUNITY): Payer: Self-pay | Admitting: Gastroenterology

## 2019-05-15 DIAGNOSIS — T18128A Food in esophagus causing other injury, initial encounter: Secondary | ICD-10-CM

## 2019-05-15 NOTE — Telephone Encounter (Signed)
-----   Message from Mauri Pole, MD sent at 05/15/2019  1:12 PM EDT ----- Please schedule him for barium esophagogram in 2 weeks and follow up virtual office visit after that, next available. Thanks

## 2019-05-16 NOTE — Telephone Encounter (Signed)
No answer. Left a message on his voicemail asking he return my call about scheduling.

## 2019-06-03 ENCOUNTER — Other Ambulatory Visit: Payer: Self-pay

## 2019-06-03 NOTE — Telephone Encounter (Signed)
Letter mailed to the patient

## 2019-11-05 ENCOUNTER — Other Ambulatory Visit: Payer: Self-pay | Admitting: Adult Health

## 2019-11-06 ENCOUNTER — Other Ambulatory Visit: Payer: Self-pay | Admitting: Adult Health

## 2019-11-07 ENCOUNTER — Other Ambulatory Visit: Payer: Self-pay | Admitting: Adult Health

## 2019-11-08 ENCOUNTER — Telehealth: Payer: Self-pay | Admitting: Pulmonary Disease

## 2019-11-08 ENCOUNTER — Ambulatory Visit (INDEPENDENT_AMBULATORY_CARE_PROVIDER_SITE_OTHER): Payer: Medicare Other | Admitting: Primary Care

## 2019-11-08 ENCOUNTER — Encounter: Payer: Self-pay | Admitting: Primary Care

## 2019-11-08 DIAGNOSIS — J449 Chronic obstructive pulmonary disease, unspecified: Secondary | ICD-10-CM

## 2019-11-08 MED ORDER — SPIRIVA RESPIMAT 2.5 MCG/ACT IN AERS: 2.0000 | INHALATION_SPRAY | Freq: Every day | RESPIRATORY_TRACT | 0 refills | Status: DC

## 2019-11-08 MED ORDER — ALBUTEROL SULFATE HFA 108 (90 BASE) MCG/ACT IN AERS: 2.0000 | INHALATION_SPRAY | RESPIRATORY_TRACT | 3 refills | Status: DC | PRN

## 2019-11-08 NOTE — Patient Instructions (Signed)
Recommendation: - Trial Spiriva Respimat 2.2mcg - take 2 puffs once daily in the morning  - Refill albuterol rescue inhaler-2 puffs every 4-6 hours as needed for breakthrough shortness of breath or wheezing  Follow-up: - 2 to 3 weeks with Dr. [name] or Eustaquio Maize NP (in office or televisit is fine)   Chronic Obstructive Pulmonary Disease Chronic obstructive pulmonary disease (COPD) is a long-term (chronic) lung problem. When you have COPD, it is hard for air to get in and out of your lungs. Usually the condition gets worse over time, and your lungs will never return to normal. There are things you can do to keep yourself as healthy as possible.  Your doctor may treat your condition with: ? Medicines. ? Oxygen. ? Lung surgery.  Your doctor may also recommend: ? Rehabilitation. This includes steps to make your body work better. It may involve a team of specialists. ? Quitting smoking, if you smoke. ? Exercise and changes to your diet. ? Comfort measures (palliative care). Follow these instructions at home: Medicines  Take over-the-counter and prescription medicines only as told by your doctor.  Talk to your doctor before taking any cough or allergy medicines. You may need to avoid medicines that cause your lungs to be dry. Lifestyle  If you smoke, stop. Smoking makes the problem worse. If you need help quitting, ask your doctor.  Avoid being around things that make your breathing worse. This may include smoke, chemicals, and fumes.  Stay active, but remember to rest as well.  Learn and use tips on how to relax.  Make sure you get enough sleep. Most adults need at least 7 hours of sleep every night.  Eat healthy foods. Eat smaller meals more often. Rest before meals. Controlled breathing Learn and use tips on how to control your breathing as told by your doctor. Try:  Breathing in (inhaling) through your nose for 1 second. Then, pucker your lips and breath out (exhale) through your  lips for 2 seconds.  Putting one hand on your belly (abdomen). Breathe in slowly through your nose for 1 second. Your hand on your belly should move out. Pucker your lips and breathe out slowly through your lips. Your hand on your belly should move in as you breathe out.  Controlled coughing Learn and use controlled coughing to clear mucus from your lungs. Follow these steps: 1. Lean your head a little forward. 2. Breathe in deeply. 3. Try to hold your breath for 3 seconds. 4. Keep your mouth slightly open while coughing 2 times. 5. Spit any mucus out into a tissue. 6. Rest and do the steps again 1 or 2 times as needed. General instructions  Make sure you get all the shots (vaccines) that your doctor recommends. Ask your doctor about a flu shot and a pneumonia shot.  Use oxygen therapy and pulmonary rehabilitation if told by your doctor. If you need home oxygen therapy, ask your doctor if you should buy a tool to measure your oxygen level (oximeter).  Make a COPD action plan with your doctor. This helps you to know what to do if you feel worse than usual.  Manage any other conditions you have as told by your doctor.  Avoid going outside when it is very hot, cold, or humid.  Avoid people who have a sickness you can catch (contagious).  Keep all follow-up visits as told by your doctor. This is important. Contact a doctor if:  You cough up more mucus than usual.  There is a change in the color or thickness of the mucus.  It is harder to breathe than usual.  Your breathing is faster than usual.  You have trouble sleeping.  You need to use your medicines more often than usual.  You have trouble doing your normal activities such as getting dressed or walking around the house. Get help right away if:  You have shortness of breath while resting.  You have shortness of breath that stops you from: ? Being able to talk. ? Doing normal activities.  Your chest hurts for longer  than 5 minutes.  Your skin color is more blue than usual.  Your pulse oximeter shows that you have low oxygen for longer than 5 minutes.  You have a fever.  You feel too tired to breathe normally. Summary  Chronic obstructive pulmonary disease (COPD) is a long-term lung problem.  The way your lungs work will never return to normal. Usually the condition gets worse over time. There are things you can do to keep yourself as healthy as possible.  Take over-the-counter and prescription medicines only as told by your doctor.  If you smoke, stop. Smoking makes the problem worse. This information is not intended to replace advice given to you by your health care provider. Make sure you discuss any questions you have with your health care provider. Document Revised: 09/22/2017 Document Reviewed: 11/14/2016 Elsevier Patient Education  2020 [employer].

## 2019-11-08 NOTE — Progress Notes (Signed)
Virtual Visit via Telephone Note  I connected with Raymond Jacobs on 11/08/19 at  4:00 PM EST by telephone and verified that I am speaking with the correct person using two identifiers.  Location: Patient: Home Provider: Office   I discussed the limitations, risks, security and privacy concerns of performing an evaluation and management service by telephone and the availability of in person appointments. I also discussed with the patient that there may be a patient responsible charge related to this service. The patient expressed understanding and agreed to proceed.   History of Present Illness: 69 year old male, current every day smoker.  Past medical history significant for midl COPD and centrilobular emphysema.  Patient of Dr. [name], last seen by pulmonary nurse practitioner in October 2019. Maintained on albuterol HFA q4-6 hours as needed.  11/08/2019 Patient contacted today for routine televisit/medication refill.  He is doing well with no acute complaints.  Feels his breathing is okay.  Uses albuterol rescue inhaler 1-2 times a day which he states helps.  Reports that he uses inhaler some days more than others.  Experiences shortness of breath and occasional chest pressure with activities such as golfing and at night. Continues to smoke 1 pack daily.    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 Complete atelectasis of right middle and lower lobes  Spirometry showed mild airway obstruction  with ratio 66, FEV1 83% and FVC 93%  Assessment and Plan:  Mild COPD: -Symptoms appear mostly stable, however, he is using SABA on average 1-3 daily -Trial Spiriva Respimat 2.22mcg- 2 puffs twice daily -Refill albuterol HFA 2 puffs every 4 hours as needed for breakthrough shortness of breath or wheezing  Follow Up Instructions:  -Follow-up in 2 to 3 weeks with Dr. [name] or Eustaquio Maize NP   I discussed the assessment and treatment plan with the patient. The patient was provided an opportunity to ask questions and all were answered. The patient agreed with the plan and demonstrated an understanding of the instructions.   The patient was advised to call back or seek an in-person evaluation if the symptoms worsen or if the condition fails to improve as anticipated.  I provided 18 minutes of non-face-to-face time during this encounter.   Martyn Ehrich, NP

## 2019-11-08 NOTE — Telephone Encounter (Signed)
Pt has not been seen since 08/10/18. Pt will need an appt prior to being able to receive any med refills.  Called and spoke with pt and stated this info to him that we needed to get him scheduled for an appt. Pt verbalized understanding and was scheduled for a televisit with Beth today at 4pm. Nothing further needed.

## 2019-11-29 ENCOUNTER — Ambulatory Visit (INDEPENDENT_AMBULATORY_CARE_PROVIDER_SITE_OTHER): Payer: Medicare Other | Admitting: Primary Care

## 2019-11-29 ENCOUNTER — Encounter: Payer: Self-pay | Admitting: Primary Care

## 2019-11-29 ENCOUNTER — Other Ambulatory Visit: Payer: Self-pay

## 2019-11-29 DIAGNOSIS — J432 Centrilobular emphysema: Secondary | ICD-10-CM

## 2019-11-29 MED ORDER — SPIRIVA RESPIMAT 2.5 MCG/ACT IN AERS: 2.0000 | INHALATION_SPRAY | Freq: Every day | RESPIRATORY_TRACT | 0 refills | Status: DC

## 2019-11-29 MED ORDER — MELATONIN 5 MG PO CAPS: 1.0000 | ORAL_CAPSULE | Freq: Every evening | ORAL | 1 refills | Status: DC | PRN

## 2019-11-29 MED ORDER — SPIRIVA RESPIMAT 2.5 MCG/ACT IN AERS: 2.0000 | INHALATION_SPRAY | Freq: Every day | RESPIRATORY_TRACT | 12 refills | Status: DC

## 2019-11-29 NOTE — Patient Instructions (Addendum)
Sleep difficulties: - Melatonin 5mg  an hour before bedtime   Mild COPD: - Start Spiriva respimat, take two puffs once daily in the morning (if helps your breathing/COPD let us know and we will send in prescription) - Continue albuterol rescue inhaler 2 puffs every 4 hours as needed for breakthrough shortness of breath or wheezing  Follow-up: - 6 months with Dr. [name]   COVID-19 Vaccine Information can be found at: ShippingScam.co.uk For questions related to vaccine distribution or appointments, please email [email or web address] .[email or web address] or call [phone].
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
## 2019-11-29 NOTE — Assessment & Plan Note (Signed)
-   PFTs showed mild obstruction; FEV1 83%, ratio 66 - Experiences dyspnea with moderate exertion, occasional wheezing. Using SABA twice daily - Trial Spiriva respimat 2.5 two puffs twice daily (sample given, patient to let office know if helps and will send in RX) - Continue Albuterol hfa 2 puffs every 4-6 hours for sob/wheezing - FU in 6 months or sooner if needed

## 2020-01-17 ENCOUNTER — Telehealth: Payer: Self-pay | Admitting: *Deleted

## 2020-01-17 NOTE — Telephone Encounter (Signed)
spiriva sample returned, patient never came to pick up. AVS mailed to patient with a note stating he could call office for sample if he wanted to try again to come by and pick it up.  Nothing further needed at this time.

## 2020-02-15 IMAGING — DX DG CHEST 1V
1 series · 1 of 1 positions shown · non-contrast
Comparison: Abdominal series with frontal chest x-ray from same
day. Chest x-ray dated July 13, 2012.

CLINICAL DATA: Cough.

EXAM:
CHEST  1 VIEW

[chest lat]
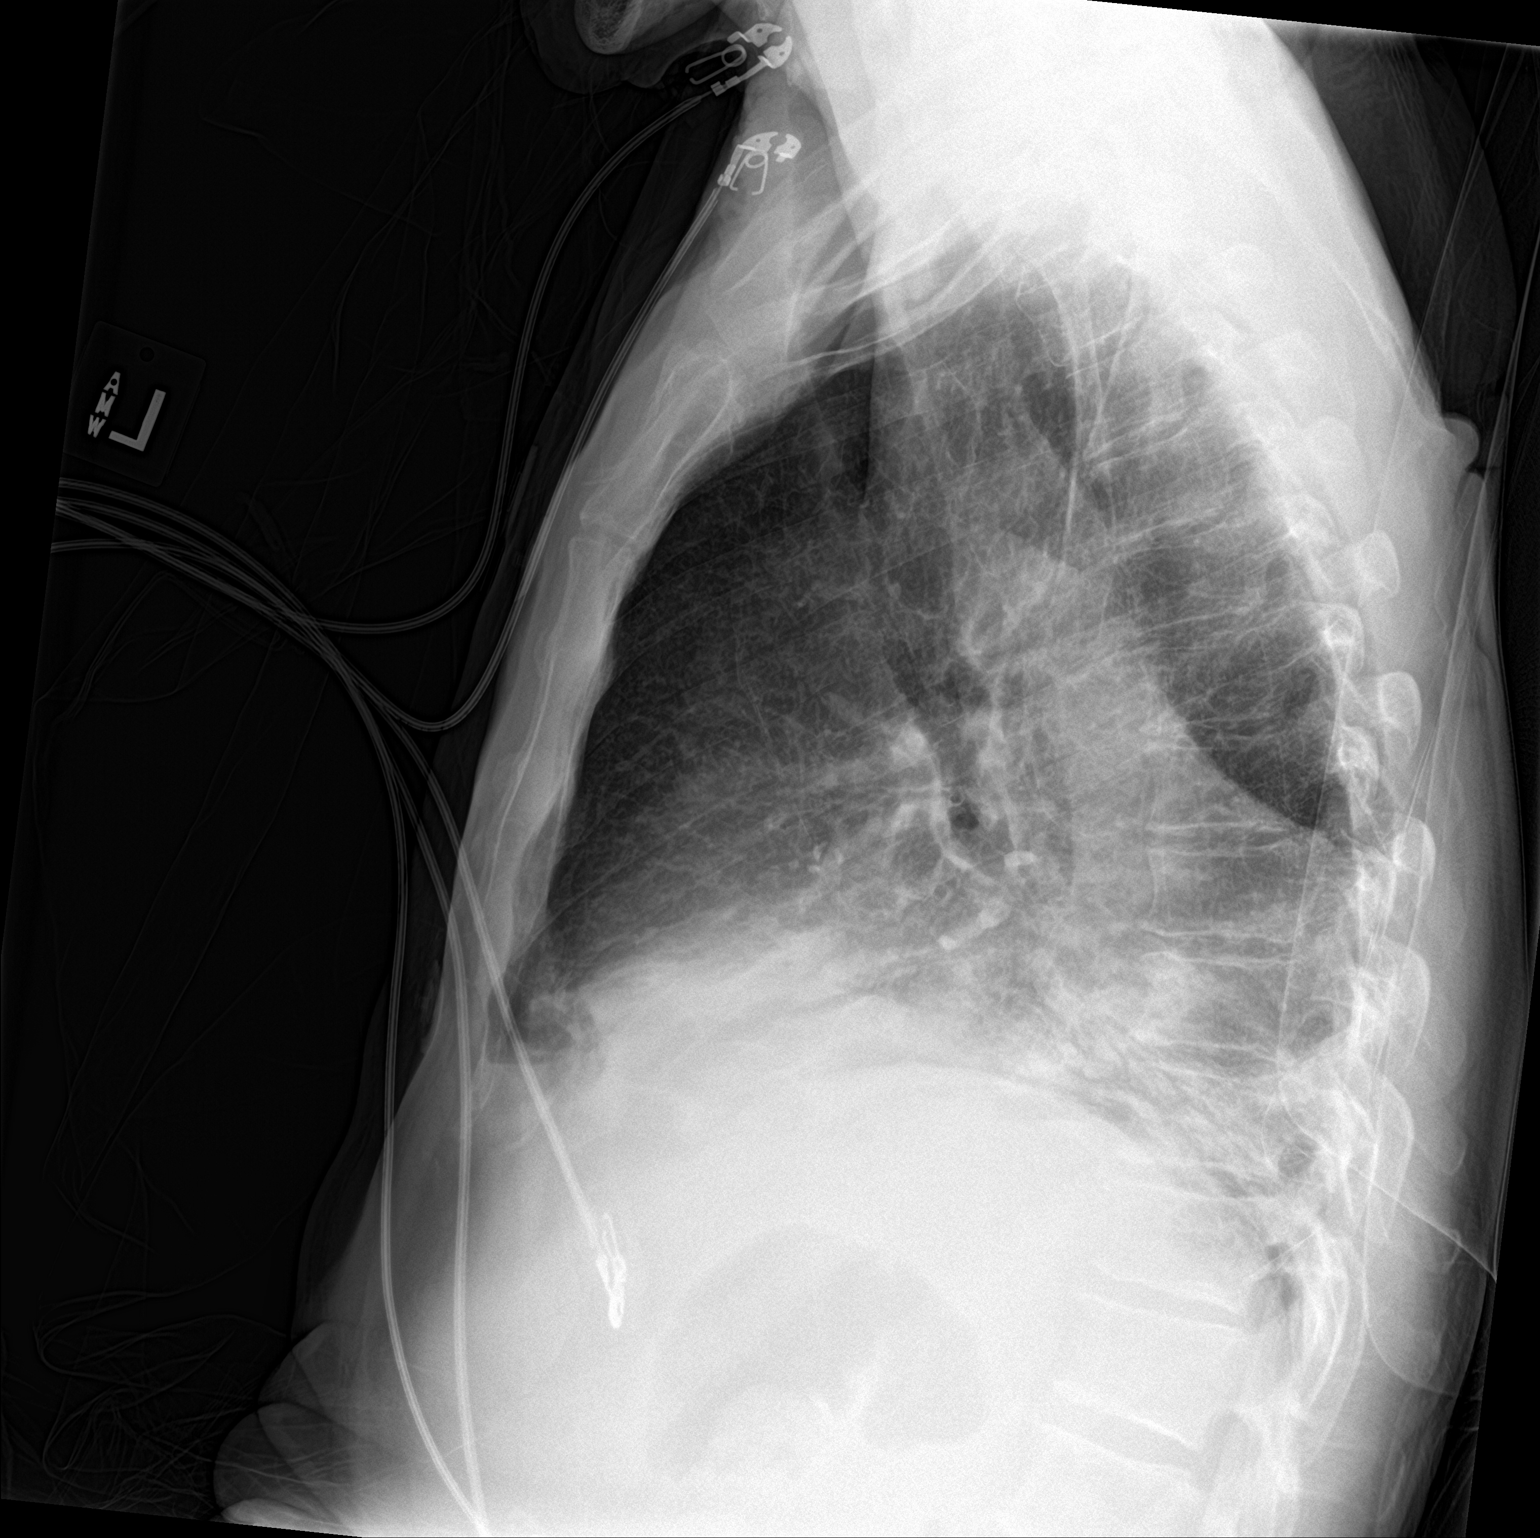

[1 of 1 positions shown; findings below may reference images not displayed]

FINDINGS: Single lateral view demonstrates bibasilar atelectasis/scarring with
patchy airspace disease in the right lower lobe. No pleural
effusion.
IMPRESSION: Bibasilar atelectasis/scarring with superimposed right lower lobe
pneumonia.

## 2020-02-15 IMAGING — CT CT ABD-PELV W/ CM
2 of 5 series · 15 of 46 positions shown, 17 images · IV contrast (APPLIED)
Comparison: None.

ADDENDUM:
Critical Value/emergent results were called by telephone at the time
of interpretation on 03/30/2018 at [DATE] to Dr. LAKOTA MAYORAL , who
verbally acknowledged these results.
CLINICAL DATA: Generalized abdominal pain for 2 days. History of
stomach ulcer.

EXAM:
CT ABDOMEN AND PELVIS WITH CONTRAST
TECHNIQUE: Multidetector CT imaging of the abdomen and pelvis was performed
using the standard protocol following bolus administration of
intravenous contrast.
CONTRAST:  100mL OMNIPAQUE IOHEXOL 300 MG/ML  SOLN

[Series 3: abd/ pelvis 5.0 i30f 2 · axial · 0.65mm/px · z∈[-576,-206]mm · 12 of 86 slices shown, 14 images]
[im 6/86  soft-tissue]
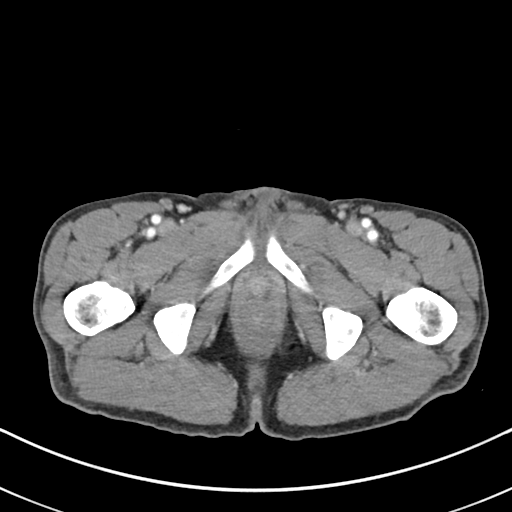
[im 6/86  bone]
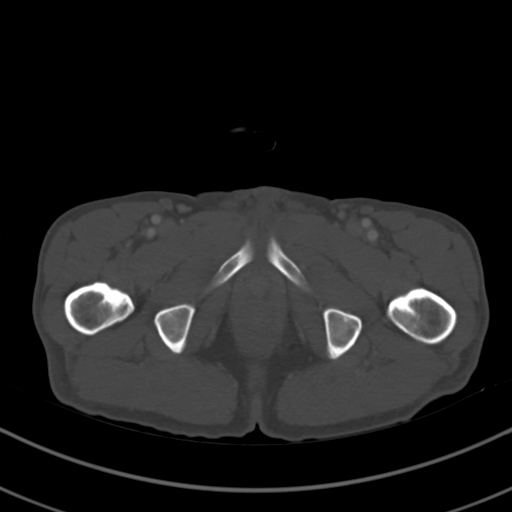
[im 11/86  soft-tissue]
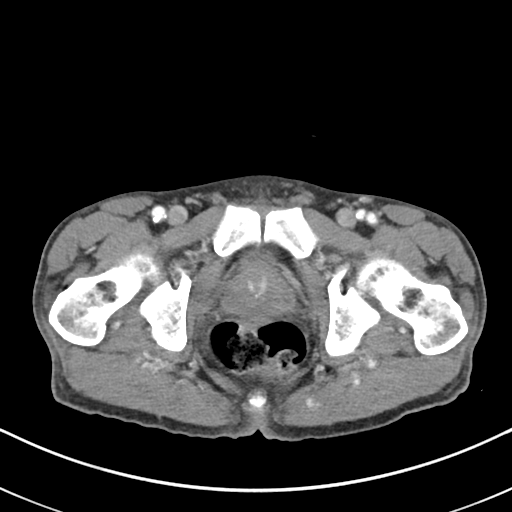
[im 22/86  soft-tissue]
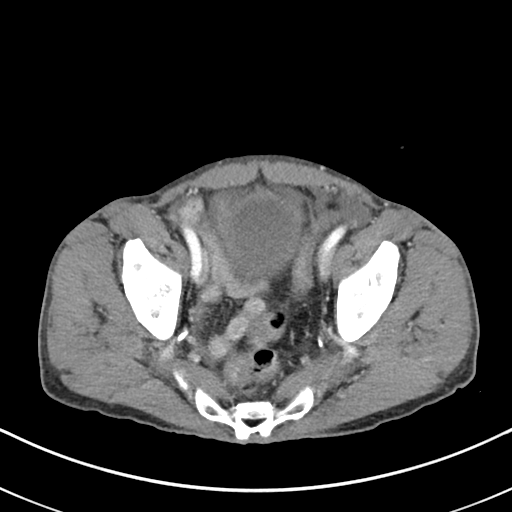
[im 27/86  soft-tissue]
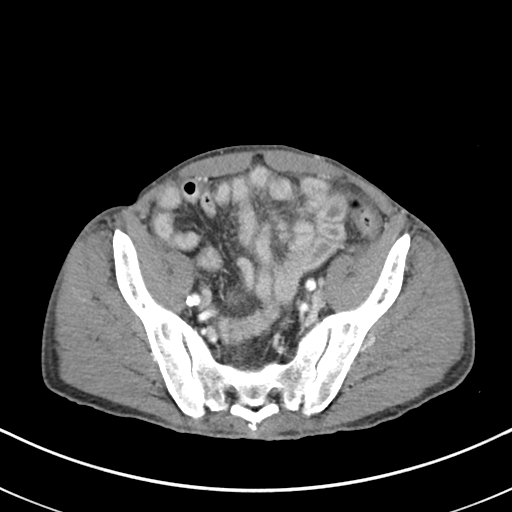
[im 32/86  soft-tissue]
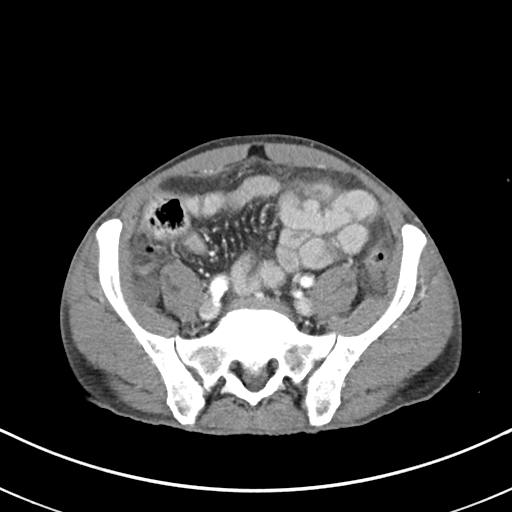
[im 38/86  soft-tissue]
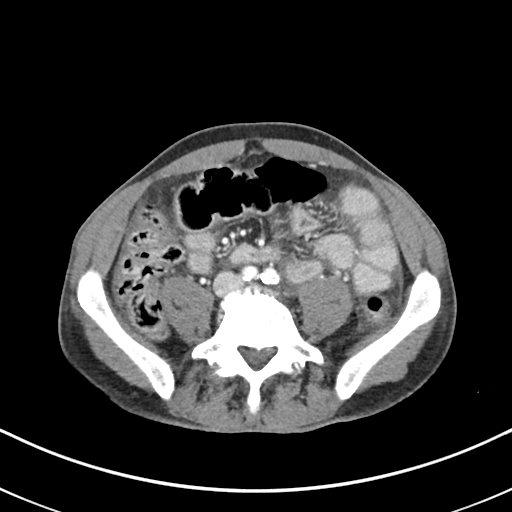
[im 48/86  soft-tissue]
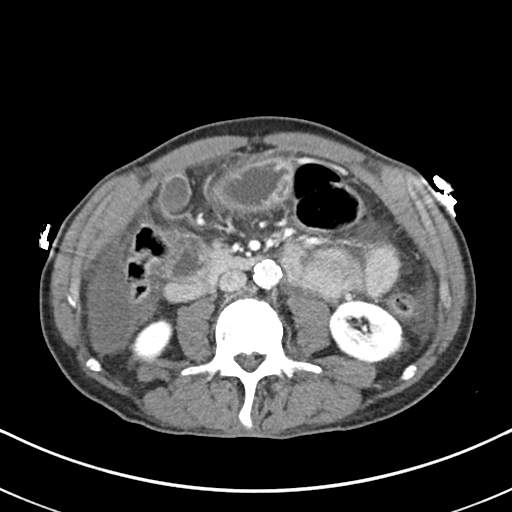
[im 54/86  soft-tissue]
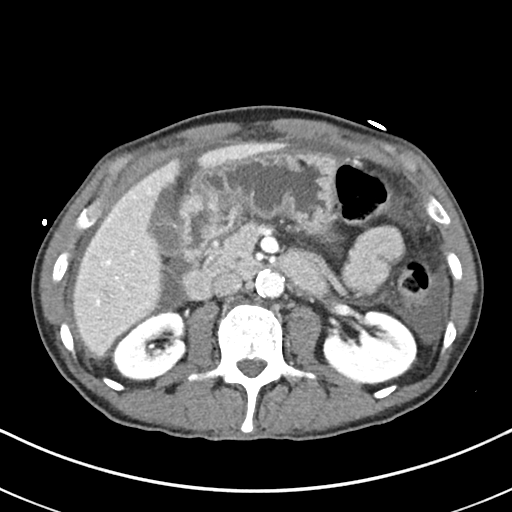
[im 59/86  soft-tissue]
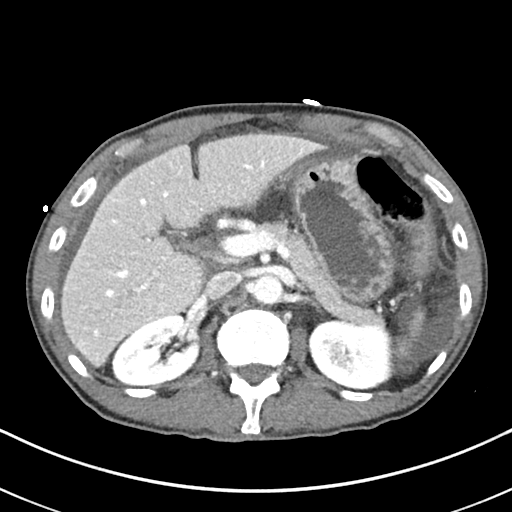
[im 59/86  bone]
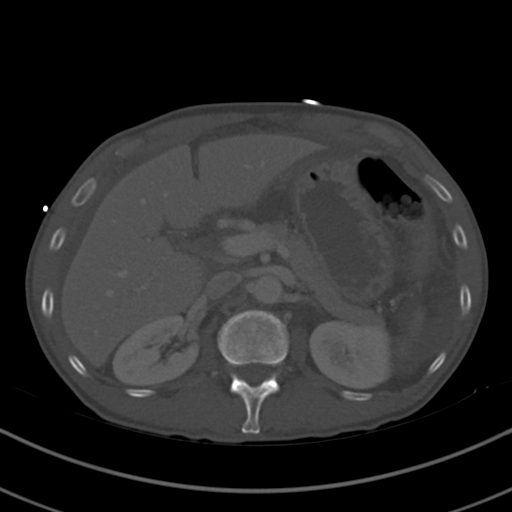
[im 64/86  soft-tissue]
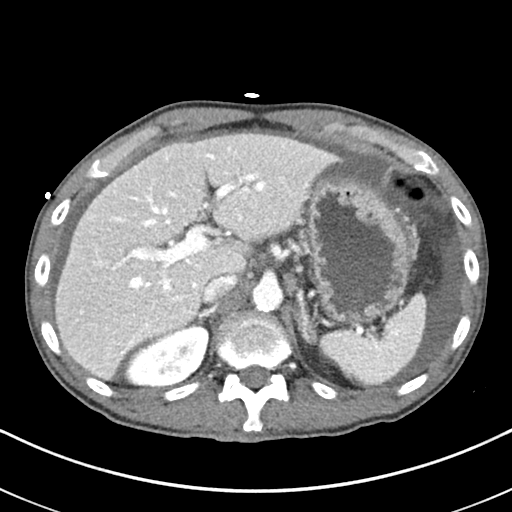
[im 75/86  soft-tissue]
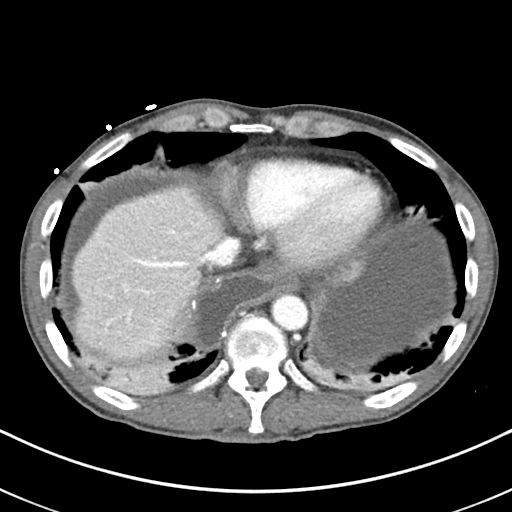
[im 80/86  soft-tissue]
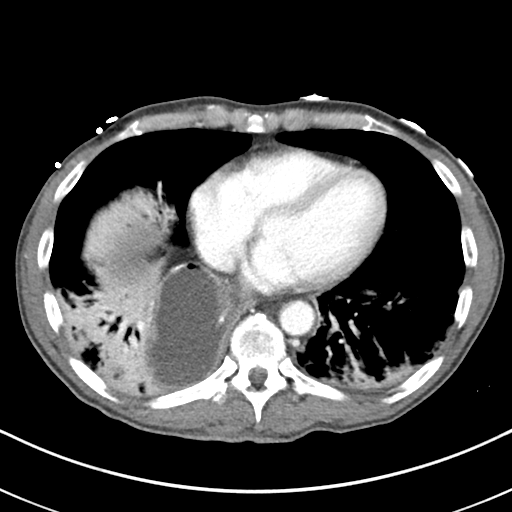

[Series 6: coronal soft tissue · coronal · 0.62mm/px · 3 of 73 slices shown]
[im 25/73  soft-tissue]
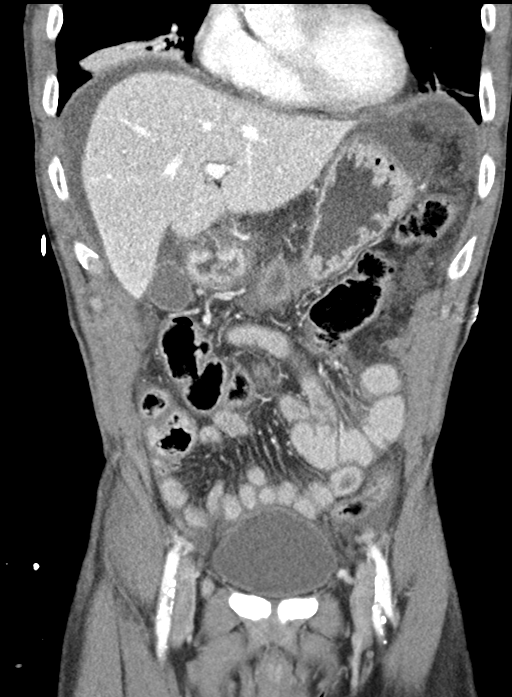
[im 33/73  soft-tissue]
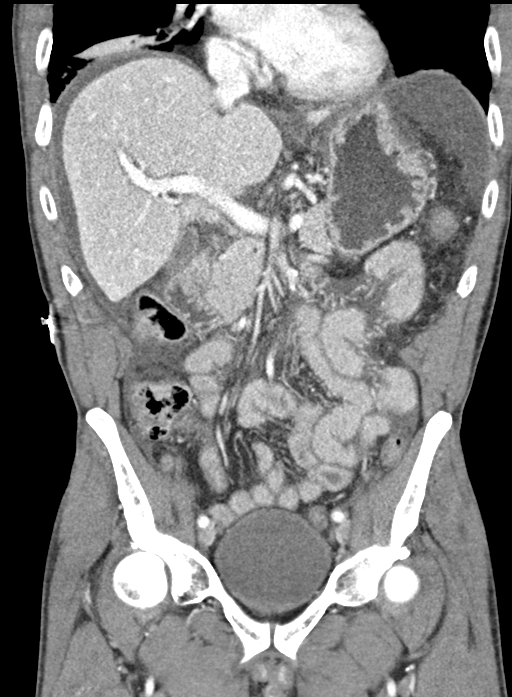
[im 41/73  soft-tissue]
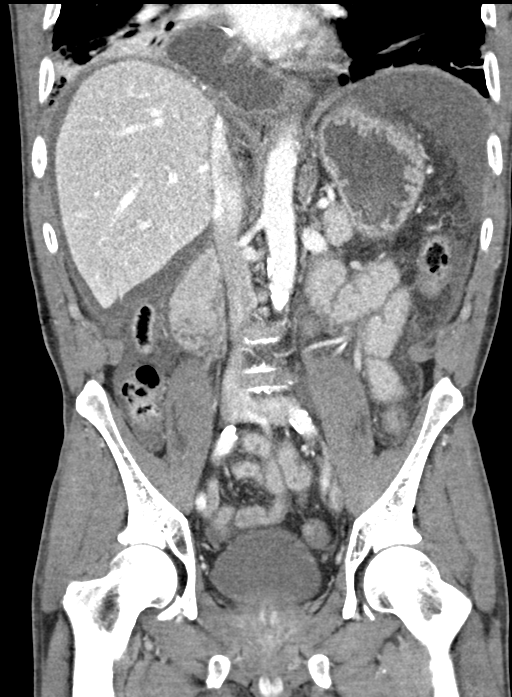

[15 of 46 positions shown; findings below may reference images not displayed]

FINDINGS: LOWER CHEST: Partially imaged 8.1 x 5.1 cm rim enhancing fluid
collection RIGHT lung base with adjacent consolidation with air
bronchograms. Mild RIGHT middle lobe and LEFT lower lobe atelectasis
versus pneumonia.

HEPATOBILIARY: Liver and gallbladder are normal.

PANCREAS: Normal.

SPLEEN: Normal.

ADRENALS/URINARY TRACT: Kidneys are orthotopic, demonstrating
symmetric enhancement. No nephrolithiasis, hydronephrosis or solid
renal masses. The unopacified ureters are normal in course and
caliber. Delayed imaging through the kidneys demonstrates symmetric
prompt contrast excretion within the proximal urinary collecting
system. Urinary bladder is partially distended with layering
densities on delayed phase. Normal adrenal glands.

STOMACH/BOWEL: Apparent dehiscent proximal duodenum concerning for
ulcer tracking to through the serosal surface with potential
punctate pneumoperitoneum (coronal image 26 and 27. Mild hyperemic
small-bowel wall thickening. Mild colonic wall thickening, bowel is
overall normal in course and caliber. Mildly edematous appendix
similar to degree of breast:.

VASCULAR/LYMPHATIC: Aortoiliac vessels are normal in course and
caliber. Moderate calcific atherosclerosis. No lymphadenopathy by CT
size criteria.

REPRODUCTIVE: Normal.

OTHER: Moderate amount of low-density free fluid without rim
enhancing fluid collections. Mild mesenteric edema.

MUSCULOSKELETAL: Nonacute.  Severe L4-5 and L5-S1 degenerative disc.
IMPRESSION: 1. 8.1 x 5.1 cm RIGHT lung base fluid collection, this is likely
pleural-based seen with empyema, atypical appearing hiatal hernia or
patulous esophagus/achalasia. RIGHT potentially LEFT lung base
pneumonia. Recommend CT chest with contrast and concurrent CT
esophagram.
2. Probable perforated duodenal ulcer the punctate pneumoperitoneum.
Moderate volume ascites.
3. Enterocolitis, presumably reactive.
4. Layering density in the urinary bladder concerning for
calcification, less likely contrast.

## 2020-02-19 IMAGING — DX DG CHEST 1V PORT
1 series · 1 of 1 positions shown · non-contrast
Comparison: 03/31/2018

CLINICAL DATA: Shortness of breath. Atelectasis. Perforated
duodenal ulcer.

EXAM:
PORTABLE CHEST 1 VIEW

[chest ap]
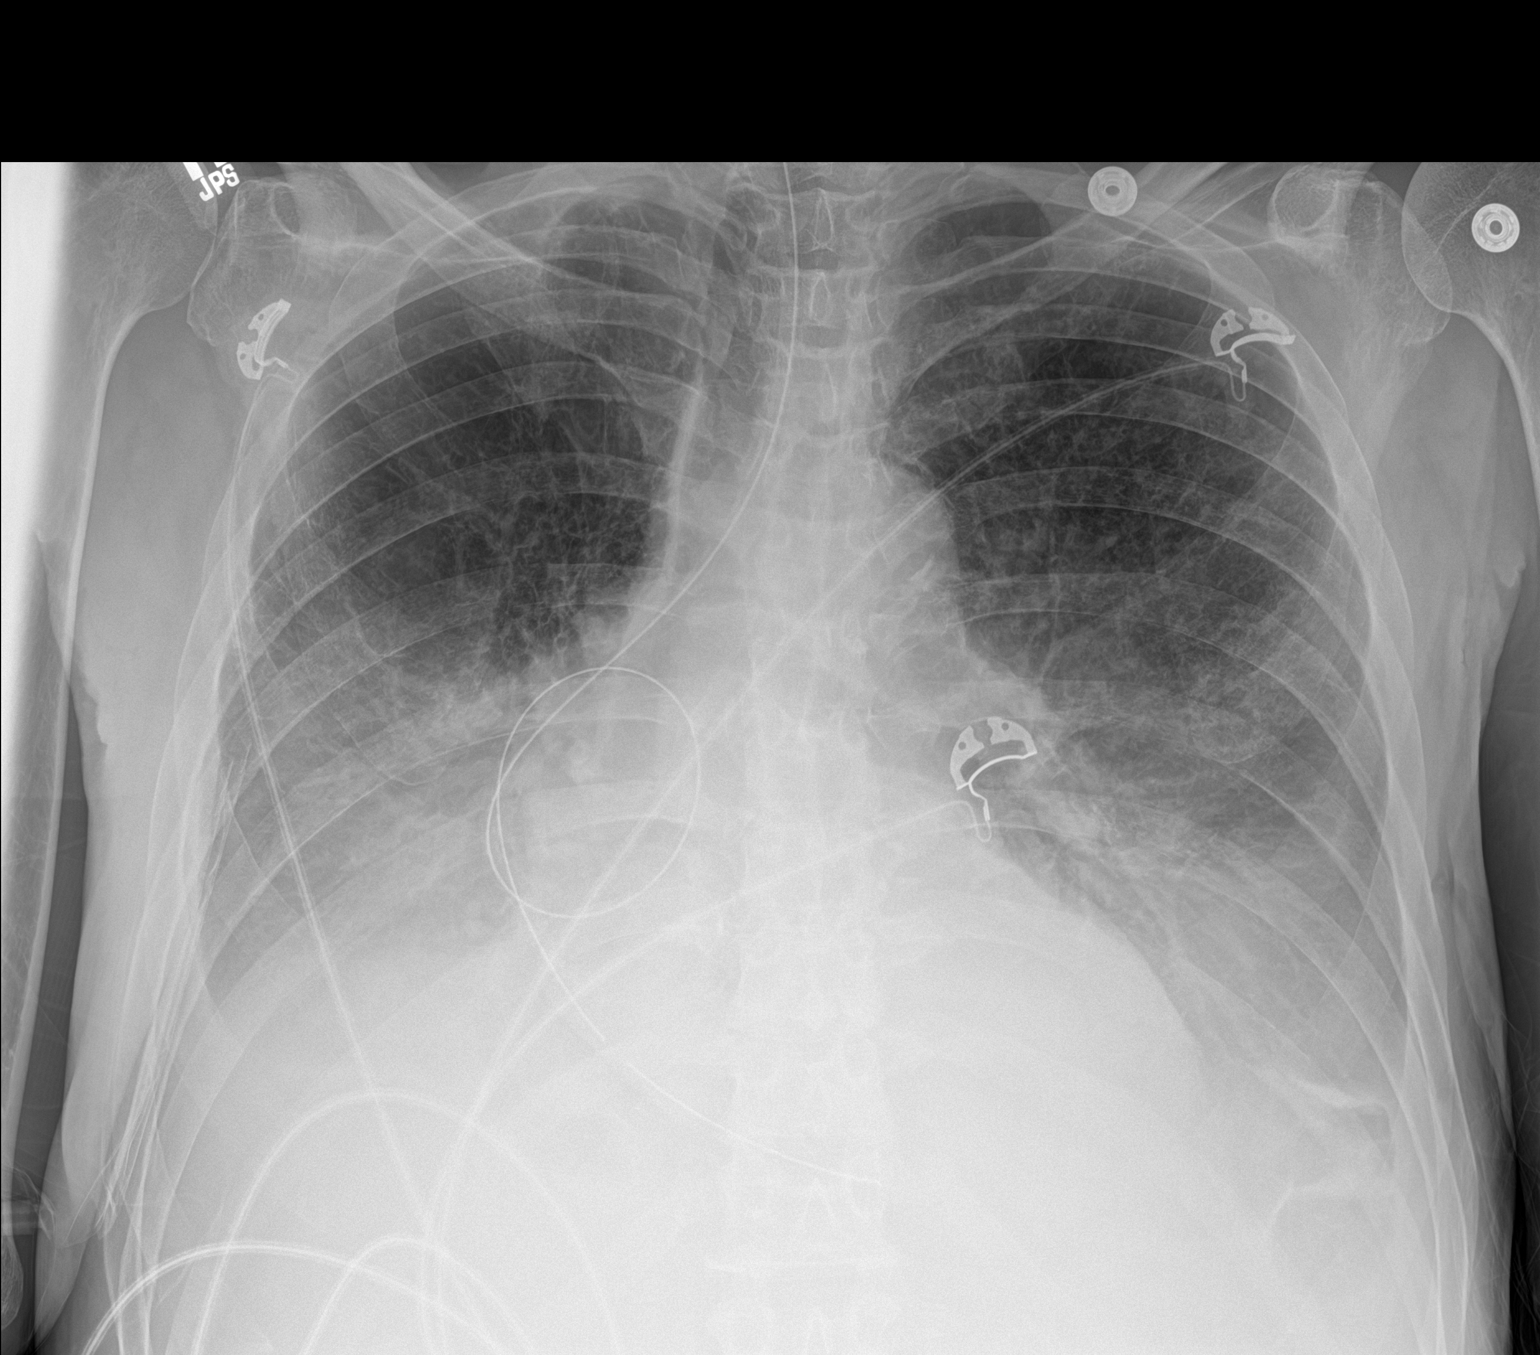

[1 of 1 positions shown; findings below may reference images not displayed]

FINDINGS: There has been placement of a nasogastric tube, which is looped
within a large hiatal hernia with distal tip near the GE junction.
Moderate bilateral pleural effusions and bibasilar atelectasis or
consolidation shows no significant change. Heart size is stable. No
pneumothorax visualized.
IMPRESSION: New nasogastric tube is looped within a large hiatal hernia, with
distal tip near the GE junction.

No significant change in moderate bilateral pleural effusions and
bibasilar atelectasis or consolidation.

## 2020-03-05 ENCOUNTER — Ambulatory Visit (INDEPENDENT_AMBULATORY_CARE_PROVIDER_SITE_OTHER): Payer: Medicare Other | Admitting: Primary Care

## 2020-03-05 ENCOUNTER — Encounter: Payer: Self-pay | Admitting: Primary Care

## 2020-03-05 ENCOUNTER — Ambulatory Visit (INDEPENDENT_AMBULATORY_CARE_PROVIDER_SITE_OTHER): Payer: Medicare Other

## 2020-03-05 ENCOUNTER — Other Ambulatory Visit: Payer: Self-pay

## 2020-03-05 VITALS — BP 116/76 | HR 80 | Temp 97.5°F | Ht 64.0 in | Wt 118.4 lb

## 2020-03-05 DIAGNOSIS — J441 Chronic obstructive pulmonary disease with (acute) exacerbation: Secondary | ICD-10-CM

## 2020-03-05 DIAGNOSIS — J449 Chronic obstructive pulmonary disease, unspecified: Secondary | ICD-10-CM | POA: Diagnosis not present

## 2020-03-05 MED ORDER — SPIRIVA RESPIMAT 2.5 MCG/ACT IN AERS: 2.0000 | INHALATION_SPRAY | Freq: Every day | RESPIRATORY_TRACT | 12 refills | Status: DC

## 2020-03-05 MED ORDER — PREDNISONE 10 MG PO TABS: ORAL_TABLET | ORAL | 0 refills | Status: DC

## 2020-03-05 MED ORDER — SPIRIVA RESPIMAT 2.5 MCG/ACT IN AERS: 2.0000 | INHALATION_SPRAY | Freq: Every day | RESPIRATORY_TRACT | 0 refills | Status: DC

## 2020-03-05 NOTE — Progress Notes (Signed)
Please let patient know CXR showed emphysema., no had no evidence of pneumonia. He had dialated debris -filled esophagus consistent with achalasia, please refer to GI. No abx needed at this time. I will send in prednisone 20mg  x 5 days for COPD flare.

## 2020-03-05 NOTE — Patient Instructions (Addendum)
Recommendations: Continue Spiriva respimat 2.26mcg two puffs once daily Stay well hydrated  Orders: CXR re: COPD exacerbation  Covid testing (Contacted number [phone])  Rx: Prednisone taper as prescribed  Appointment: Pharmacy for smoking cessation class   Follow-up: Televisit in 10 days with APP
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
## 2020-03-06 ENCOUNTER — Telehealth: Payer: Self-pay | Admitting: Primary Care

## 2020-03-06 NOTE — Telephone Encounter (Signed)
Raymond Ehrich, NP  03/05/2020 10:54 AM EDT    Please let patient know CXR showed emphysema., no had no evidence of pneumonia. He had dialated debris -filled esophagus consistent with achalasia, please refer to GI. No abx needed at this time. I will send in prednisone 20mg  x 5 days for COPD flare.        LMTCB

## 2020-03-06 NOTE — Telephone Encounter (Signed)
Advised pt of results. Pt understood and nothing further is needed. Advised Rx sent into his pharmacy.  He already has a GI doctor and will call and make an appt with her.

## 2020-03-06 NOTE — Telephone Encounter (Signed)
Pt returning a phone call. Pt can be reached at [phone].

## 2020-03-07 ENCOUNTER — Other Ambulatory Visit: Payer: Self-pay | Admitting: Primary Care

## 2020-03-08 DIAGNOSIS — J441 Chronic obstructive pulmonary disease with (acute) exacerbation: Secondary | ICD-10-CM | POA: Insufficient documentation

## 2020-03-08 NOTE — Assessment & Plan Note (Addendum)
-   Acute exacerbation shortness of breath and coughing from cigarette smoke  - Continue Spiriva respimat 2.36mcg two puffs once daily - CXR showed emphysema, no superimposed pneumonia. Dilated debris-filled esophagus compatible with achalasia. Referred to GI.  - Advised patient be tested for Covid-19 (Contacted number [phone]) - Rx: Prednisone 20mg  x 5 days  - Appointment with Pharmacy for smoking cessation class  - Follow-up Televisit in 10 days with APP

## 2020-03-09 ENCOUNTER — Other Ambulatory Visit (HOSPITAL_COMMUNITY): Payer: Medicare Other

## 2020-03-09 ENCOUNTER — Ambulatory Visit: Payer: Medicare Other | Attending: Internal Medicine

## 2020-03-09 ENCOUNTER — Other Ambulatory Visit: Payer: Medicare Other

## 2020-03-09 DIAGNOSIS — Z20822 Contact with and (suspected) exposure to covid-19: Secondary | ICD-10-CM

## 2020-03-10 LAB — NOVEL CORONAVIRUS, NAA: SARS-CoV-2, NAA: NOT DETECTED

## 2020-03-10 LAB — SARS-COV-2, NAA 2 DAY TAT

## 2020-03-12 NOTE — Progress Notes (Signed)
5/14 pt. Provided CT results by Jonelle Sidle.

## 2020-03-19 ENCOUNTER — Ambulatory Visit: Payer: Medicare Other | Admitting: Adult Health

## 2020-07-16 ENCOUNTER — Encounter: Payer: Self-pay | Admitting: Primary Care

## 2020-07-16 ENCOUNTER — Other Ambulatory Visit: Payer: Self-pay

## 2020-07-16 ENCOUNTER — Ambulatory Visit (INDEPENDENT_AMBULATORY_CARE_PROVIDER_SITE_OTHER): Payer: Medicare Other | Admitting: Primary Care

## 2020-07-16 VITALS — BP 100/58 | HR 98 | Temp 97.3°F | Ht 64.5 in | Wt 118.2 lb

## 2020-07-16 DIAGNOSIS — J449 Chronic obstructive pulmonary disease, unspecified: Secondary | ICD-10-CM

## 2020-07-16 DIAGNOSIS — J441 Chronic obstructive pulmonary disease with (acute) exacerbation: Secondary | ICD-10-CM | POA: Diagnosis not present

## 2020-07-16 MED ORDER — SPIRIVA RESPIMAT 2.5 MCG/ACT IN AERS: 2.0000 | INHALATION_SPRAY | Freq: Every day | RESPIRATORY_TRACT | 12 refills | Status: DC

## 2020-07-16 MED ORDER — ALBUTEROL SULFATE HFA 108 (90 BASE) MCG/ACT IN AERS: 2.0000 | INHALATION_SPRAY | RESPIRATORY_TRACT | 3 refills | Status: DC | PRN

## 2020-07-16 MED ORDER — DOXYCYCLINE HYCLATE 100 MG PO TABS: 100.0000 mg | ORAL_TABLET | Freq: Two times a day (BID) | ORAL | 0 refills | Status: DC

## 2020-07-16 MED ORDER — PREDNISONE 10 MG PO TABS: ORAL_TABLET | ORAL | 0 refills | Status: DC

## 2020-07-16 NOTE — Assessment & Plan Note (Signed)
-   Increased shortness of breath x 1 month with associated np cough and wheezing. LS with scattered rhonchi and insp wheezing. O2 94% RA, afebrile. Received covid-19 vaccines. Treating for AECOPD with doxycycline 1 tab twice daily x 10 days and Prednisone taper. I suspect he may have underlying component of asthma. He needs full PFTs to assess for COPD/asthma overlap or worsening obstructive lung disease. Continue Spiriva Respimat 2.59mcg two puffs once daily and prn albuterol 1-2 puffs every 4 hours. Will re-consider maintenance inhalers after testing. FU in 2-4 weeks with pftS
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
## 2020-07-16 NOTE — Patient Instructions (Addendum)
Recommendations: Continue Spiriva Respimat two puffs once daily  Take albuterol 1-2 puffs every 4 hours as needed for  Take mucinex 600mg  twice daily x 10 days  Rx: Prednisone taper Doxycycline 1 tab twice daily x 10 days  Orders: Full PFTs in 2-4 weeks   Follow-up: 2-4 weeks with PFTs with Dr. [name] or Eustaquio Maize NP

## 2020-08-11 ENCOUNTER — Ambulatory Visit (INDEPENDENT_AMBULATORY_CARE_PROVIDER_SITE_OTHER): Payer: Medicare Other | Admitting: Primary Care

## 2020-08-11 ENCOUNTER — Other Ambulatory Visit: Payer: Self-pay

## 2020-08-11 ENCOUNTER — Encounter: Payer: Self-pay | Admitting: Primary Care

## 2020-08-11 ENCOUNTER — Ambulatory Visit (INDEPENDENT_AMBULATORY_CARE_PROVIDER_SITE_OTHER): Payer: Medicare Other | Admitting: Pulmonary Disease

## 2020-08-11 VITALS — BP 116/64 | HR 75 | Ht 64.5 in | Wt 118.6 lb

## 2020-08-11 DIAGNOSIS — J449 Chronic obstructive pulmonary disease, unspecified: Secondary | ICD-10-CM

## 2020-08-11 DIAGNOSIS — R059 Cough, unspecified: Secondary | ICD-10-CM

## 2020-08-11 LAB — PULMONARY FUNCTION TEST
DL/VA % pred: 67 %
DL/VA: 2.82 ml/min/mmHg/L
DLCO cor % pred: 71 %
DLCO cor: 15.59 ml/min/mmHg
DLCO unc % pred: 71 %
DLCO unc: 15.59 ml/min/mmHg
FEF 25-75 Post: 2.01 L/sec
FEF 25-75 Pre: 1.58 L/sec
FEF2575-%Change-Post: 27 %
FEF2575-%Pred-Post: 99 %
FEF2575-%Pred-Pre: 77 %
FEV1-%Change-Post: 5 %
FEV1-%Pred-Post: 112 %
FEV1-%Pred-Pre: 106 %
FEV1-Post: 2.93 L
FEV1-Pre: 2.78 L
FEV1FVC-%Change-Post: 6 %
FEV1FVC-%Pred-Pre: 91 %
FEV6-%Change-Post: -1 %
FEV6-%Pred-Post: 119 %
FEV6-%Pred-Pre: 120 %
FEV6-Post: 3.99 L
FEV6-Pre: 4.05 L
FEV6FVC-%Change-Post: 0 %
FEV6FVC-%Pred-Post: 105 %
FEV6FVC-%Pred-Pre: 105 %
FVC-%Change-Post: -1 %
FVC-%Pred-Post: 113 %
FVC-%Pred-Pre: 114 %
FVC-Post: 4.05 L
FVC-Pre: 4.1 L
Post FEV1/FVC ratio: 72 %
Post FEV6/FVC ratio: 99 %
Pre FEV1/FVC ratio: 68 %
Pre FEV6/FVC Ratio: 99 %
RV % pred: 104 %
RV: 2.21 L
TLC % pred: 103 %
TLC: 6.11 L

## 2020-08-11 LAB — CBC WITH DIFFERENTIAL/PLATELET
Basophils Absolute: 0.1 10*3/uL (ref 0.0–0.1)
Basophils Relative: 0.9 % (ref 0.0–3.0)
Eosinophils Absolute: 0.4 10*3/uL (ref 0.0–0.7)
Eosinophils Relative: 5.2 % — ABNORMAL HIGH (ref 0.0–5.0)
HCT: 40.9 % (ref 39.0–52.0)
Hemoglobin: 14 g/dL (ref 13.0–17.0)
Lymphocytes Relative: 33.2 % (ref 12.0–46.0)
Lymphs Abs: 2.8 10*3/uL (ref 0.7–4.0)
MCHC: 34.2 g/dL (ref 30.0–36.0)
MCV: 89.4 fl (ref 78.0–100.0)
Monocytes Absolute: 0.6 10*3/uL (ref 0.1–1.0)
Monocytes Relative: 7.2 % (ref 3.0–12.0)
Neutro Abs: 4.5 10*3/uL (ref 1.4–7.7)
Neutrophils Relative %: 53.5 % (ref 43.0–77.0)
Platelets: 368 10*3/uL (ref 150.0–400.0)
RBC: 4.57 Mil/uL (ref 4.22–5.81)
RDW: 13.7 % (ref 11.5–15.5)
WBC: 8.5 10*3/uL (ref 4.0–10.5)

## 2020-08-11 LAB — NITRIC OXIDE: Nitric Oxide: 69

## 2020-08-11 NOTE — Progress Notes (Signed)
PFT done today. 
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
## 2020-08-11 NOTE — Patient Instructions (Addendum)
Pulmonary function testing showed evidence mild COPD, you may have a component of asthma. This was not seen on PFTs, however, your FENO was elevated at 69. We will also check lab work today to help with diagnosis. No change to inhalers today, if you continue to have exacerbations of your breathing we may consider adding steriod inhaler at some point   Recommendations: - Continue Spiriva Respimat two puffs once daily - Encourage you continue to cut down amount you smoke and pick a target quit date  - Notify office if develop increased shortness of breath, cough or wheezing  Orders: CBC with diff IgE  Follow-up: - 6 months with Dr. [name], or sooner if needed

## 2020-08-11 NOTE — Assessment & Plan Note (Addendum)
Treated for AECOPD in September, significantly improved with oral steriods. Pulmonary function testing today showed evidence of mild obstructive lung disease without BD response and mild diffusion defect which corrects for lung volumes. FENO was elevated at 69. Checking CBC with diff and IgE. No change to inhalers today, if patient continuous to have exacerbations consider adding ICS at some point   Recommendations: - Continue Spiriva Respimat 2.50mcg two puffs once daily - Encourage patient continue to cut down amount he smokes and pick a target quit date  - Advised patient to notify office if he develops increased shortness of breath, cough or wheezing  Orders: - CBC with diff - IgE  Follow-up: - 6 months with Dr. [name], or sooner if needed

## 2020-08-12 LAB — IGE: IgE (Immunoglobulin E), Serum: 213 kU/L — ABNORMAL HIGH (ref <=114)

## 2020-08-13 NOTE — Progress Notes (Signed)
Please let patient know his allergy markers are elevated, we will use this to help manage his condition moving forward. I think he has some degree of asthma. May benefit from steriod inhaler at some point. Follow-up with Dr. [name] in 6 months or sooner if resp symptoms worsen

## 2020-08-14 ENCOUNTER — Telehealth: Payer: Self-pay | Admitting: Primary Care

## 2020-08-14 NOTE — Telephone Encounter (Signed)
Raymond Ehrich, NP  [name], Waldemar Dickens, CMA Please let patient know his allergy markers are elevated, we will use this to help manage his condition moving forward. I think he has some degree of asthma. May benefit from steriod inhaler at some point. Follow-up with Dr. [name] in 6 months or sooner if resp symptoms worsen    Called and spoke with pt letting him know the results of labwork and pt verbalized understanding. Nothing further needed.

## 2020-09-12 ENCOUNTER — Other Ambulatory Visit: Payer: Self-pay

## 2020-09-12 ENCOUNTER — Encounter (HOSPITAL_COMMUNITY): Payer: Self-pay | Admitting: *Deleted

## 2020-09-12 ENCOUNTER — Emergency Department (HOSPITAL_COMMUNITY): Payer: Medicare Other

## 2020-09-12 ENCOUNTER — Emergency Department (HOSPITAL_COMMUNITY)
Admission: EM | Admit: 2020-09-12 | Discharge: 2020-09-13 | Disposition: A | Discharge status: Home / Self Care | Payer: Medicare Other | Attending: Emergency Medicine | Admitting: Emergency Medicine

## 2020-09-12 DIAGNOSIS — Z20822 Contact with and (suspected) exposure to covid-19: Secondary | ICD-10-CM | POA: Insufficient documentation

## 2020-09-12 DIAGNOSIS — Z7951 Long term (current) use of inhaled steroids: Secondary | ICD-10-CM | POA: Diagnosis not present

## 2020-09-12 DIAGNOSIS — Z87891 Personal history of nicotine dependence: Secondary | ICD-10-CM | POA: Insufficient documentation

## 2020-09-12 DIAGNOSIS — J441 Chronic obstructive pulmonary disease with (acute) exacerbation: Secondary | ICD-10-CM | POA: Diagnosis not present

## 2020-09-12 DIAGNOSIS — R0602 Shortness of breath: Secondary | ICD-10-CM | POA: Diagnosis present

## 2020-09-12 LAB — COMPREHENSIVE METABOLIC PANEL
ALT: 20 U/L (ref 0–44)
AST: 28 U/L (ref 15–41)
Albumin: 3.8 g/dL (ref 3.5–5.0)
Alkaline Phosphatase: 50 U/L (ref 38–126)
Anion gap: 12 (ref 5–15)
BUN: 6 mg/dL — ABNORMAL LOW (ref 8–23)
CO2: 21 mmol/L — ABNORMAL LOW (ref 22–32)
Calcium: 9.1 mg/dL (ref 8.9–10.3)
Chloride: 96 mmol/L — ABNORMAL LOW (ref 98–111)
Creatinine, Ser: 0.7 mg/dL (ref 0.61–1.24)
GFR, Estimated: >60 mL/min (ref >60)
Glucose, Bld: 111 mg/dL — ABNORMAL HIGH (ref 70–99)
Potassium: 3.9 mmol/L (ref 3.5–5.1)
Sodium: 129 mmol/L — ABNORMAL LOW (ref 135–145)
Total Bilirubin: 0.7 mg/dL (ref 0.3–1.2)
Total Protein: 6.7 g/dL (ref 6.5–8.1)

## 2020-09-12 LAB — CBC WITH DIFFERENTIAL/PLATELET
Abs Immature Granulocytes: 0.04 10*3/uL (ref 0.00–0.07)
Basophils Absolute: 0.1 10*3/uL (ref 0.0–0.1)
Basophils Relative: 1 %
Eosinophils Absolute: 0.8 10*3/uL — ABNORMAL HIGH (ref 0.0–0.5)
Eosinophils Relative: 8 %
HCT: 40.6 % (ref 39.0–52.0)
Hemoglobin: 13.9 g/dL (ref 13.0–17.0)
Immature Granulocytes: 0 %
Lymphocytes Relative: 25 %
Lymphs Abs: 2.6 10*3/uL (ref 0.7–4.0)
MCH: 29.6 pg (ref 26.0–34.0)
MCHC: 34.2 g/dL (ref 30.0–36.0)
MCV: 86.4 fL (ref 80.0–100.0)
Monocytes Absolute: 0.9 10*3/uL (ref 0.1–1.0)
Monocytes Relative: 9 %
Neutro Abs: 5.9 10*3/uL (ref 1.7–7.7)
Neutrophils Relative %: 57 %
Platelets: 334 10*3/uL (ref 150–400)
RBC: 4.7 MIL/uL (ref 4.22–5.81)
RDW: 13.4 % (ref 11.5–15.5)
WBC: 10.3 10*3/uL (ref 4.0–10.5)
nRBC: 0 % (ref 0.0–0.2)

## 2020-09-12 LAB — POC SARS CORONAVIRUS 2 AG -  ED: SARS Coronavirus 2 Ag: NEGATIVE

## 2020-09-12 LAB — TROPONIN I (HIGH SENSITIVITY): Troponin I (High Sensitivity): 3 ng/L (ref <18)

## 2020-09-12 MED ORDER — ALBUTEROL SULFATE HFA 108 (90 BASE) MCG/ACT IN AERS
8.0000 | INHALATION_SPRAY | Freq: Once | RESPIRATORY_TRACT | Status: AC
  Administered 2020-09-12: 8 via RESPIRATORY_TRACT
  Filled 2020-09-12: qty 6.7

## 2020-09-12 MED ORDER — ALBUTEROL SULFATE (2.5 MG/3ML) 0.083% IN NEBU
5.0000 mg | INHALATION_SOLUTION | Freq: Once | RESPIRATORY_TRACT | Status: AC
  Administered 2020-09-12: 5 mg via RESPIRATORY_TRACT
  Filled 2020-09-12: qty 6

## 2020-09-12 MED ORDER — IPRATROPIUM BROMIDE 0.02 % IN SOLN
0.5000 mg | Freq: Once | RESPIRATORY_TRACT | Status: AC
  Administered 2020-09-12: 0.5 mg via RESPIRATORY_TRACT
  Filled 2020-09-12: qty 2.5

## 2020-09-12 MED ORDER — METHYLPREDNISOLONE SODIUM SUCC 125 MG IJ SOLR
125.0000 mg | Freq: Once | INTRAMUSCULAR | Status: AC
  Administered 2020-09-12: 125 mg via INTRAVENOUS
  Filled 2020-09-12: qty 2

## 2020-09-12 NOTE — ED Provider Notes (Signed)
[facility] EMERGENCY DEPARTMENT Provider Note   CSN: [id number] Arrival date & time: 09/12/20  2053     History Chief Complaint  Patient presents with  . Shortness of Breath    Raymond Jacobs is a 69 y.o. male.  Patient with history of COPD, continuous smoker, PUD presents with increasing SOB and persistent dry cough. No fever or chest pain. No vomiting or diarrhea. He has been using his inhaler without relief. He does not have a nebulizer machine at home and is not home oxygen dependent. He states he continues to smoke but infrequently.   The history is provided by the patient. No language interpreter was used.  Shortness of Breath Associated symptoms: cough   Associated symptoms: no chest pain, no fever, no rash, no sore throat, no vomiting and no wheezing        Past Medical History:  Diagnosis Date  . Achalasia   . Aortic atherosclerosis ([place]) 04/08/2018   CT scan  . Duodenal ulcer    duodenal bulb  . Hiatal hernia   . Hyperplastic colon polyp   . Pleural effusion   . Stomach ulcer     Patient Active Problem List   Diagnosis Date Noted  . Stage 1 mild COPD by GOLD classification ([place]) 08/11/2020  . COPD with acute exacerbation ([place]) 03/08/2020  . Food impaction of esophagus   . Esophageal dysphagia   . Pleural effusion 08/13/2018  . Smoking 08/13/2018  . Centrilobular emphysema ([place]) 05/09/2018  . Pneumonia of right lower lobe due to infectious organism   . Atelectasis   . Perforated duodenal ulcer ([place])   . Protein-calorie malnutrition, severe 03/31/2018  . Abdominal pain 03/30/2018  . 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 PUD (peptic ulcer disease) 08/15/2012    Past Surgical History:  Procedure Laterality Date  . BALLOON DILATION N/A 05/14/2019   Procedure: BALLOON DILATION;  Surgeon: Mauri Pole, MD;  Location: [facility] ENDOSCOPY;  Service: Endoscopy;  Laterality: N/A;  Rigiflex balloon dilation  . BIOPSY  08/21/2018   Procedure: BIOPSY;   Surgeon: Jerene Bears, MD;  Location: [facility];  Service: Gastroenterology;;  . Lum Keas INJECTION N/A 08/21/2018   Procedure: BOTOX INJECTION;  Surgeon: Jerene Bears, MD;  Location: [facility];  Service: Gastroenterology;  Laterality: N/A;  . ESOPHAGOGASTRODUODENOSCOPY (EGD) WITH PROPOFOL N/A 08/21/2018   Procedure: ESOPHAGOGASTRODUODENOSCOPY (EGD) WITH PROPOFOL;  Surgeon: Jerene Bears, MD;  Location: [facility];  Service: Gastroenterology;  Laterality: N/A;  . ESOPHAGOGASTRODUODENOSCOPY (EGD) WITH PROPOFOL N/A 05/14/2019   Procedure: ESOPHAGOGASTRODUODENOSCOPY (EGD) WITH PROPOFOL;  Surgeon: Mauri Pole, MD;  Location: [facility];  Service: Endoscopy;  Laterality: N/A;  pneumatic balloon dilation with Gastrografin 2 hours post procedure  . FOREIGN BODY REMOVAL  05/14/2019   Procedure: FOREIGN BODY REMOVAL;  Surgeon: Mauri Pole, MD;  Location: [facility];  Service: Endoscopy;;  . IR RADIOLOGIST EVAL & MGMT  04/24/2018       Family History  Problem Relation Age of Onset  . Colon cancer Brother   . Diabetes Brother   . Diabetes Maternal Aunt     Social History   Tobacco Use  . Smoking status: Former Smoker    Packs/day: 1.25    Years: 44.00    Pack years: 55.00    Types: Cigarettes    Start date: 62    Quit date: 03/01/2020    Years since quitting: 0.5  . Smokeless tobacco: Never Used  Vaping Use  . Vaping Use: Never  used  Substance Use Topics  . Alcohol use: Yes    Comment: 1 beer and 3 shots vodka daily  . Drug use: Yes    Types: Marijuana    Home Medications Prior to Admission medications   Medication Sig Start Date End Date Taking? Authorizing Provider  albuterol (VENTOLIN HFA) 108 (90 Base) MCG/ACT inhaler Inhale 2 puffs into the lungs every 4 (four) hours as needed for wheezing or shortness of breath. 07/16/20 08/15/20  Martyn Ehrich, NP  Ascorbic Acid (VITAMIN C) 500 MG CAPS Take 500 mg by mouth 2 (two) times a week.    [provider]  B Complex-C-Folic Acid (SUPER B-COMPLEX/VIT C/FA) TABS Take 1 tablet by mouth See admin instructions. Take 1 tablet by mouth 5 times per week    [provider]  Cholecalciferol (DIALYVITE VITAMIN D 5000) 125 MCG (5000 UT) capsule Take 5,000 Units by mouth See admin instructions. Take 5000 units 5 to 6 days per week    [provider]  famotidine (PEPCID) 20 MG tablet Take 1 tablet (20 mg total) by mouth 2 (two) times daily. Patient taking differently: Take 20 mg by mouth See admin instructions. Take 20 mg daily, may take a second 20 mg dose as needed for heartburn 07/17/18   Pyrtle, Lajuan Lines, MD  ferrous sulfate 325 (65 FE) MG tablet Take 325 mg by mouth See admin instructions. Take 325 mg by mouth 6 times per week    [provider]  IBUPROFEN PO Take 1 tablet by mouth every 4 (four) hours as needed.    [provider]  Melatonin 5 MG CAPS Take 1 capsule (5 mg total) by mouth at bedtime as needed. Patient not taking: Reported on 08/11/2020 11/29/19   Martyn Ehrich, NP  Naphazoline-Glycerin (CLEAR EYES REDNESS RELIEF OP) Place 1 drop into both eyes daily as needed (redness/irritation).    [provider]  Nutritional Supplements (ENSURE MAX PROTEIN) LIQD Take 1 each by mouth 3 (three) times daily. Please dispense 90 bottles per month Patient taking differently: Take 1 each by mouth 2 (two) times a day. Please dispense 90 bottles per month 06/26/18   Clent Demark, PA-C  Omega-3 Fatty Acids (FISH OIL) 500 MG CAPS Take 1,000 mg by mouth daily.    [provider]  Potassium 99 MG TABS Take 99 mg by mouth 2 (two) times a week.    [provider]  Tiotropium Bromide Monohydrate (SPIRIVA RESPIMAT) 2.5 MCG/ACT AERS Inhale 2 puffs into the lungs daily before breakfast. 07/16/20   Martyn Ehrich, NP  Vitamin A 2400 MCG (8000 UT) CAPS Take 8,000 Units by mouth See admin instructions. Take 8000 units 6 days per week    [provider]    Allergies    Asa [aspirin] and Other  Review of Systems   Review of Systems  Constitutional: Negative for chills and fever.  HENT: Negative.  Negative for congestion and sore throat.   Respiratory: Positive for cough and shortness of breath. Negative for wheezing.   Cardiovascular: Negative.  Negative for chest pain.  Gastrointestinal: Negative.  Negative for diarrhea and vomiting.  Musculoskeletal: Negative.   Skin: Negative.  Negative for rash.  Neurological: Negative.  Negative for weakness.  Psychiatric/Behavioral: Negative for confusion.    Physical Exam Updated Vital Signs BP (!) 148/72 (BP Location: Right Arm)   Pulse (!) 109   Temp (!) 97.5 F (36.4 C) (Oral)   Resp (!) 24  Ht 5\' 5"  (1.651 m)   Wt 53.5 kg   SpO2 92%   BMI 19.63 kg/m   Physical Exam Constitutional:      Appearance: He is well-developed.  HENT:     Head: Normocephalic.  Cardiovascular:     Rate and Rhythm: Normal rate and regular rhythm.  Pulmonary:     Effort: Pulmonary effort is normal.     Breath sounds: Normal breath sounds. No wheezing, rhonchi or rales.     Comments: Actively coughing with respirations.  Chest:     Chest wall: No tenderness.  Abdominal:     General: Bowel sounds are normal.     Palpations: Abdomen is soft.     Tenderness: There is no abdominal tenderness. There is no guarding or rebound.  Musculoskeletal:        General: Normal range of motion.     Cervical back: Normal range of motion and neck supple.  Skin:    General: Skin is warm and dry.     Findings: No rash.  Neurological:     Mental Status: He is alert and oriented to person, place, and time.     ED Results / Procedures / Treatments   Labs (all labs ordered are listed, but only abnormal results are displayed) Labs Reviewed  CBC WITH DIFFERENTIAL/PLATELET  COMPREHENSIVE METABOLIC PANEL  POC SARS CORONAVIRUS 2 AG -  ED  TROPONIN I (HIGH SENSITIVITY)     EKG None  Radiology No results found.  Procedures Procedures (including critical care time)  Medications Ordered in ED Medications  methylPREDNISolone sodium succinate (SOLU-MEDROL) 125 mg/2 mL injection 125 mg (has no administration in time range)  albuterol (VENTOLIN HFA) 108 (90 Base) MCG/ACT inhaler 8 puff (has no administration in time range)    ED Course  I have reviewed the triage vital signs and the nursing notes.  Pertinent labs & imaging results that were available during my care of the patient were reviewed by me and considered in my medical decision making (see chart for details).    MDM Rules/Calculators/A&P                          Patient to ED with SOB and persistent cough. History of COPD. No chest pain.   Albuterol inhaler x 8 puffs and 125 mg Solumedrol provided for suspected COPD exacerbation. CXR without signs of PNA, PTX. No hypoxia, not on oxygen at my evaluation, O2 sat 93%. POC COVID pending.   Nebulizer treatment provided after COVID neg test. Do not suspect active COVID infection in vaccinated patient with no fever, ST, congestion. Symptoms favor COPD.   He is much improved after neb x 1. Observed for recurrent symptoms for 30 minutes. Patient ambulated without symptoms of SOB or cough. Maintains normal O2 saturations.   He can be discharged home. Will provide prednisone x 6 days as taper dose. Encouraged PCP recheck in 3 days.  Final Clinical Impression(s) / ED Diagnoses Final diagnoses:  None   1. COPD exacerbation, mild   Rx / DC Orders ED Discharge Orders    None       Dennie Bible 09/13/20 0116    Drenda Freeze, MD 09/13/20 1455

## 2020-09-12 NOTE — ED Triage Notes (Signed)
The pt is having a very difficult time breathing he is coughing with every breath respiratory distress at present  Started tonight

## 2020-09-13 MED ORDER — PREDNISONE 10 MG PO TABS: ORAL_TABLET | ORAL | 0 refills | Status: DC

## 2020-09-13 NOTE — ED Notes (Signed)
Pt ambulated in the room without oxygen. Pt maintained oxygen saturations between 92-95% on room air.

## 2020-09-13 NOTE — ED Notes (Signed)
Discharge instructions discussed with pt. Pt verbalized understanding. Pt stable and ambulatory. No signature pad available. 

## 2020-09-13 NOTE — Discharge Instructions (Addendum)
Take prednisone as directed. Continue use of your inhaler, 2 puffs every 4 hours. If needed more frequently than this for control of symptoms, please return to the ED for further treatment.   Call you doctor and schedule a recheck appointment for 3 days from tonight.

## 2020-09-29 ENCOUNTER — Telehealth: Payer: Self-pay | Admitting: Primary Care

## 2020-09-29 NOTE — Telephone Encounter (Signed)
09/29/2020  Attempted to contact the patient.  Voicemail was left.  Encourage patient on voicemail to contact her office back and schedule a follow-up visit with either [facility] NP or Dr. [name].  Patient currently on Spiriva Respimat 2.5  Patient needs to be scheduled for follow-up with either [facility] NP or Dr. [name] especially given recent COPD exacerbation.  With recent blood work showing elevated eosinophil count.  Is not unreasonable to consider a low-dose ICS.  Will need to have an office visit to further discuss this.  This will count as attempt 1 per triage protocol.  Wyn Quaker, FNP

## 2020-10-06 NOTE — Telephone Encounter (Signed)
Pt is scheduled for an appt tomorrow 12/15 with Beth. Nothing further needed.

## 2020-10-07 ENCOUNTER — Other Ambulatory Visit: Payer: Self-pay

## 2020-10-07 ENCOUNTER — Ambulatory Visit (INDEPENDENT_AMBULATORY_CARE_PROVIDER_SITE_OTHER): Payer: Medicare Other | Admitting: Primary Care

## 2020-10-07 ENCOUNTER — Encounter: Payer: Self-pay | Admitting: Primary Care

## 2020-10-07 DIAGNOSIS — J449 Chronic obstructive pulmonary disease, unspecified: Secondary | ICD-10-CM

## 2020-10-07 MED ORDER — PREDNISONE 10 MG PO TABS: ORAL_TABLET | ORAL | 0 refills | Status: DC

## 2020-10-07 MED ORDER — FLUTICASONE-SALMETEROL 250-50 MCG/DOSE IN AEPB: 1.0000 | INHALATION_SPRAY | Freq: Two times a day (BID) | RESPIRATORY_TRACT | 5 refills | Status: DC

## 2020-10-07 MED ORDER — MONTELUKAST SODIUM 10 MG PO TABS: 10.0000 mg | ORAL_TABLET | Freq: Every day | ORAL | 5 refills | Status: DC
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
## 2020-10-07 NOTE — Assessment & Plan Note (Addendum)
-   Mild COPD with allergic asthmatic component. Patient quit smoking 2 weeks ago. Reports increased shortness of breath over the last month with associated wheezing/chest tightness. Spiriva not effective. Using SABA every 4 hours.  - PFTs 08/11/20- FEV1 2.93, RATIO 72. FENO 69, Eos abosolute 800, IgE 213 - Changing Spiriva to Advair 250-40mcg two puffs twice daily and adding Singulair 10mg  qhs  - RX prednisone 20mg  x 5 days  - Fu in 4 weeks

## 2020-10-07 NOTE — Patient Instructions (Addendum)
You have mild COPD, likely mixed disease with some underlying asthma d/t wheezing symptoms and elevated allergy markers  Recommend: - Stop Spiriva - Start Advair, take 1 puff morning and evening (rinse mouth after use) - Start Singulair 10mg  at bedtime  - Continue Albuterol rescue inhaler 2 puffs every 6 hours as needed for shortness of breath/wheezing   Follow-up: - 4 week follow up in office or telephone    Fluticasone; Salmeterol inhalation powder What is this medicine? FLUTICASONE; SALMETEROL (floo TIK a sone; sal ME te role) inhalation is a combination of two medicines that decrease inflammation and help to open up the airways of your lungs. It is used to treat COPD. This medicine is also used to treat asthma. Do NOT use for an acute asthma attack. Do NOT use for a COPD attack. This medicine may be used for other purposes; ask your health care provider or pharmacist if you have questions. COMMON BRAND NAME(S): Advair, AirDuo Digihaler, Airduo RespiClick What should I tell my health care provider before I take this medicine? They need to know if you have any of these conditions:  bone problems  diabetes  eye disease, vision problems  immune system problems  heart disease or irregular heartbeat  high blood pressure  infection  pheochromocytoma  seizures  thyroid disease  worsening asthma  an unusual or allergic reaction to fluticasone; salmeterol, other corticosteroids, other medicines, foods, dyes, or preservatives  pregnant or trying to get pregnant  breast-feeding How should I use this medicine? This medicine is inhaled through the mouth. Rinse your mouth with water after use. Make sure not to swallow the water. Follow the directions on the prescription label. Do not use a spacer device with this inhaler. Take your medicine at regular intervals. Do not take your medicine more often than directed. Do not stop taking except on your doctor's advice. Make sure that  you are using your inhaler correctly. Ask you doctor or health care provider if you have any questions. A special MedGuide will be given to you by the pharmacist with each prescription and refill. Be sure to read this information carefully each time. Talk to your pediatrician regarding the use of this medicine in children. Special care may be needed. Overdosage: If you think you have taken too much of this medicine contact a poison control center or emergency room at once. NOTE: This medicine is only for you. Do not share this medicine with others. What if I miss a dose? If you miss a dose, use it as soon as you remember. If it is almost time for your next dose, use only that dose and continue with your regular schedule, spacing doses evenly. Do not use double or extra doses. What may interact with this medicine? Do not take this medicine with any of the following medications:  MAOIs like Carbex, Eldepryl, Marplan, Nardil, and Parnate This medicine may also interact with the following medications:  aminophylline or theophylline  antiviral medicines for HIV or AIDS  beta-blockers like metoprolol and propranolol  certain antibiotics like clarithromycin, erythromycin, levofloxacin, linezolid, and telithromycin  certain medicines for fungal infections like ketoconazole, itraconazole, posaconazole, voriconazole  conivaptan  diuretics  medicines for colds  medicines for depression or emotional conditions  nefazodone  vaccines This list may not describe all possible interactions. Give your health care provider a list of all the medicines, herbs, non-prescription drugs, or dietary supplements you use. Also tell them if you smoke, drink alcohol, or use illegal drugs. Some  items may interact with your medicine. What should I watch for while using this medicine? Visit your doctor for regular check ups. Tell your doctor or health care professional if your symptoms do not get better. Do not  use this medicine more than every 12 hours. NEVER use this medicine for an acute asthma attack. You should use your short-acting rescue inhalers for this purpose. If your symptoms get worse or if you need your short-acting inhalers more often, call your doctor right away. If you are going to have surgery tell your doctor or health care professional that you are using this medicine. Try not to come in contact with people with the chicken pox or measles. If you do, call your doctor. This medicine may increase blood sugar. Ask your healthcare provider if changes in diet or medicines are needed if you have diabetes. What side effects may I notice from receiving this medicine? Side effects that you should report to your doctor or health care professional as soon as possible:  allergic reactions like skin rash or hives, swelling of the face, lips, or tongue  breathing problems right after inhaling your medicine  chest pain  fast, irregular heartbeat  feeling faint or lightheaded, falls  fever or chills  nausea, vomiting  signs and symptoms of high blood sugar such as being more thirsty or hungry or having to urinate more than normal. You may also feel very tired or have blurry vision. Side effects that usually do not require medical attention (report to your doctor or health care professional if they continue or are bothersome):  cough  headache  nervousness  sore throat  stuffy nose  tremor This list may not describe all possible side effects. Call your doctor for medical advice about side effects. You may report side effects to FDA at 1-800-FDA-1088. Where should I keep my medicine? Keep out of the reach of children. Advair: Store at room temperature between 68 and 77 degrees F (20 and 25 degrees C). Do not leave your medicine in the heat or sun. Throw away 1 month after you open the package or whenever the dose indicator reads 0, whichever comes first. Throw away unopened packages  after the expiration date. Airduo Respiclick: Store at room temperature between 59 and 86 degrees F (15 and 30 degrees C). Avoid exposure to extreme heat, cold, or humidity. Throw away 1 month after you open the package or whenever the dose indicator reads 0, whichever comes first. Throw away unopened packages after the expiration date. NOTE: This sheet is a summary. It may not cover all possible information. If you have questions about this medicine, talk to your doctor, pharmacist, or health care provider.  2020 Elsevier/Gold Standard (2018-07-11 11:26:46)  Montelukast oral tablets What is this medicine? MONTELUKAST (mon te LOO kast) is used to prevent and treat the symptoms of asthma. It is also used to treat allergies. Do not use for an acute asthma attack. This medicine may be used for other purposes; ask your health care provider or pharmacist if you have questions. COMMON BRAND NAME(S): Singulair What should I tell my health care provider before I take this medicine? They need to know if you have any of these conditions:  liver disease  an unusual or allergic reaction to montelukast, other medicines, foods, dyes, or preservatives  pregnant or trying to get pregnant  breast-feeding How should I use this medicine? This medicine should be given by mouth. Follow the directions on the prescription label. Take this  medicine at the same time every day. You may take this medicine with or without meals. Do not chew the tablets. Do not stop taking your medicine unless your doctor tells you to. Talk to your pediatrician regarding the use of this medicine in children. Special care may be needed. While this drug may be prescribed for children as young as 58 years of age for selected conditions, precautions do apply. Overdosage: If you think you have taken too much of this medicine contact a poison control center or emergency room at once. NOTE: This medicine is only for you. Do not share this  medicine with others. What if I miss a dose? If you miss a dose, skip it. Take your next dose at the normal time. Do not take extra or 2 doses at the same time to make up for the missed dose. What may interact with this medicine?  anti-infectives like rifampin and rifabutin  medicines for seizures like phenytoin, phenobarbital, and carbamazepine This list may not describe all possible interactions. Give your health care provider a list of all the medicines, herbs, non-prescription drugs, or dietary supplements you use. Also tell them if you smoke, drink alcohol, or use illegal drugs. Some items may interact with your medicine. What should I watch for while using this medicine? Visit your doctor or health care professional for regular checks on your progress. Tell your doctor or health care professional if your allergy or asthma symptoms do not improve. Take your medicine even when you do not have symptoms. Do not stop taking any of your medicine(s) unless your doctor tells you to. If you have asthma, talk to your doctor about what to do in an acute asthma attack. Always have your inhaled rescue medicine for asthma attacks with you. Patients and their families should watch for new or worsening thoughts of suicide or depression. Also watch for sudden changes in feelings such as feeling anxious, agitated, panicky, irritable, hostile, aggressive, impulsive, severely restless, overly excited and hyperactive, or not being able to sleep. Any worsening of mood or thoughts of suicide or dying should be reported to your health care professional right away. What side effects may I notice from receiving this medicine? Side effects that you should report to your doctor or health care professional as soon as possible:  allergic reactions like skin rash or hives, or swelling of the face, lips, or tongue  breathing problems  changes in emotions or moods  confusion  depressed mood  fever or  infection  hallucinations  joint pain  painful lumps under the skin  pain, tingling, numbness in the hands or feet  redness, blistering, peeling, or loosening of the skin, including inside the mouth  restlessness  seizures  sleep walking  signs and symptoms of infection like fever; chills; cough; sore throat; flu-like illness  signs and symptoms of liver injury like dark yellow or brown urine; general ill feeling or flu-like symptoms; light-colored stools; loss of appetite; nausea; right upper belly pain; unusually weak or tired; yellowing of the eyes or skin  sinus pain or swelling  stuttering  suicidal thoughts or other mood changes  tremors  trouble sleeping  uncontrolled muscle movements  unusual bleeding or bruising  vivid or bad dreams Side effects that usually do not require medical attention (report to your doctor or health care professional if they continue or are bothersome):  dizziness  drowsiness  headache  runny nose  stomach upset  tiredness This list may not describe all possible  side effects. Call your doctor for medical advice about side effects. You may report side effects to FDA at 1-800-FDA-1088. Where should I keep my medicine? Keep out of the reach of children. Store at room temperature between 15 and 30 degrees C (59 and 86 degrees F). Protect from light and moisture. Keep this medicine in the original bottle. Throw away any unused medicine after the expiration date. NOTE: This sheet is a summary. It may not cover all possible information. If you have questions about this medicine, talk to your doctor, pharmacist, or health care provider.  2020 Elsevier/Gold Standard (2019-02-08 12:54:33)
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
## 2020-11-04 ENCOUNTER — Other Ambulatory Visit: Payer: Self-pay

## 2020-11-04 ENCOUNTER — Encounter: Payer: Self-pay | Admitting: Primary Care

## 2020-11-04 ENCOUNTER — Ambulatory Visit (INDEPENDENT_AMBULATORY_CARE_PROVIDER_SITE_OTHER): Payer: Medicare Other | Admitting: Primary Care

## 2020-11-04 VITALS — BP 108/60 | HR 77 | Temp 97.9°F | Ht 64.5 in | Wt 121.6 lb

## 2020-11-04 DIAGNOSIS — Z23 Encounter for immunization: Secondary | ICD-10-CM

## 2020-11-04 DIAGNOSIS — J449 Chronic obstructive pulmonary disease, unspecified: Secondary | ICD-10-CM | POA: Diagnosis not present

## 2020-11-04 NOTE — Patient Instructions (Addendum)
Congratulating on quitting smoking!!!!   [facility] called [facility] you received covid vaccine in November. No flu vaccine at pharamcy or documented in ED note from November   Recommend: - Stop Singulair - Start Zyrtec or claritin daily (over the counter) - Continue Advair 1 puff morning and evening (rinse mouth after use) - Use albuterol (Ventolin ) rescue inhaler 2 puffs every 4-6 hours for breakthrough shortness of breath or wheezing  Follow-up: - 3 months with Dr. [name] or sooner if respiratory symptoms worsen    COPD and Physical Activity Chronic obstructive pulmonary disease (COPD) is a long-term (chronic) condition that affects the lungs. COPD is a general term that can be used to describe many different lung problems that cause lung swelling (inflammation) and limit airflow, including chronic bronchitis and emphysema. The main symptom of COPD is shortness of breath, which makes it harder to do even simple tasks. This can also make it harder to exercise and be active. Talk with your health care provider about treatments to help you breathe better and actions you can take to prevent breathing problems during physical activity. What are the benefits of exercising with COPD? Exercising regularly is an important part of a healthy lifestyle. You can still exercise and do physical activities even though you have COPD. Exercise and physical activity improve your shortness of breath by increasing blood flow (circulation). This causes your heart to pump more oxygen through your body. Moderate exercise can improve your:  Oxygen use.  Energy level.  Shortness of breath.  Strength in your breathing muscles.  Heart health.  Sleep.  Self-esteem and feelings of self-worth.  Depression, stress, and anxiety levels. Exercise can benefit everyone with COPD. The severity of your disease may affect how hard you can exercise, especially at first, but everyone can benefit. Talk with your health  care provider about how much exercise is safe for you, and which activities and exercises are safe for you.   What actions can I take to prevent breathing problems during physical activity?  Sign up for a pulmonary rehabilitation program. This type of program may include: ? Education about lung diseases. ? Exercise classes that teach you how to exercise and be more active while improving your breathing. This usually involves:  Exercise using your lower extremities, such as a stationary bicycle.  About 30 minutes of exercise, 2 to 5 times per week, for 6 to 12 weeks  Strength training, such as push ups or leg lifts. ? Nutrition education. ? Group classes in which you can talk with others who also have COPD and learn ways to manage stress.  If you use an oxygen tank, you should use it while you exercise. Work with your health care provider to adjust your oxygen for your physical activity. Your resting flow rate is different from your flow rate during physical activity.  While you are exercising: ? Take slow breaths. ? Pace yourself and do not try to go too fast. ? Purse your lips while breathing out. Pursing your lips is similar to a kissing or whistling position. ? If doing exercise that uses a quick burst of effort, such as weight lifting:  Breathe in before starting the exercise.  Breathe out during the hardest part of the exercise (such as raising the weights). Where to find support You can find support for exercising with COPD from:  Your health care provider.  A pulmonary rehabilitation program.  Your local health department or community health programs.  Support groups, online  or in-person. Your health care provider may be able to recommend support groups. Where to find more information You can find more information about exercising with COPD from:  American Lung Association: ClassInsider.se.  COPD Foundation: https://www.rivera.net/. Contact a health care provider if:  Your  symptoms get worse.  You have chest pain.  You have nausea.  You have a fever.  You have trouble talking or catching your breath.  You want to start a new exercise program or a new activity. Summary  COPD is a general term that can be used to describe many different lung problems that cause lung swelling (inflammation) and limit airflow. This includes chronic bronchitis and emphysema.  Exercise and physical activity improve your shortness of breath by increasing blood flow (circulation). This causes your heart to provide more oxygen to your body.  Contact your health care provider before starting any exercise program or new activity. Ask your health care provider what exercises and activities are safe for you. This information is not intended to replace advice given to you by your health care provider. Make sure you discuss any questions you have with your health care provider. Document Revised: 01/30/2019 Document Reviewed: 11/02/2017 Elsevier Patient Education  2021 [employer].

## 2020-11-04 NOTE — Addendum Note (Signed)
Addended by: Martyn Ehrich on: 11/04/2020 10:21 AM   Modules accepted: Orders

## 2020-11-04 NOTE — Assessment & Plan Note (Addendum)
Mild COPD with allergic asthmatic component. Spiriva was not effective. Clinically improved on Advair 250-34mcg 1 puff twice daily. He did not tolerate Singulair. Quit smoking in December 2021.     - PFTs 08/11/20- FEV1 2.93, RATIO 72. FENO 69 - Eos abosolute 800, IgE 213 - 11/04/2020 CAT score 7 - Received Covid booster in November 2021 at [facility]  - Due for high dose influenza vaccine today  - Continue Advair 250-67mcg 1 puff BID, PRN ventolin 2 puffs q 6 hour - Recommend adding over the counter antihistamine  - FU in 3 months with Dr. [name]

## 2020-11-04 NOTE — Addendum Note (Signed)
Addended by: Martyn Ehrich on: 11/04/2020 10:02 AM   Modules accepted: Orders

## 2021-01-26 ENCOUNTER — Ambulatory Visit (INDEPENDENT_AMBULATORY_CARE_PROVIDER_SITE_OTHER): Payer: Medicare Other | Admitting: Pulmonary Disease

## 2021-01-26 ENCOUNTER — Encounter: Payer: Self-pay | Admitting: Pulmonary Disease

## 2021-01-26 ENCOUNTER — Other Ambulatory Visit: Payer: Self-pay

## 2021-01-26 VITALS — BP 114/62 | HR 82 | Temp 97.7°F | Ht 65.0 in | Wt 124.4 lb

## 2021-01-26 DIAGNOSIS — F17200 Nicotine dependence, unspecified, uncomplicated: Secondary | ICD-10-CM | POA: Diagnosis not present

## 2021-01-26 DIAGNOSIS — J432 Centrilobular emphysema: Secondary | ICD-10-CM | POA: Diagnosis not present

## 2021-01-26 DIAGNOSIS — J449 Chronic obstructive pulmonary disease, unspecified: Secondary | ICD-10-CM | POA: Diagnosis not present

## 2021-01-26 NOTE — Assessment & Plan Note (Signed)
Surprisingly lung function is improved. He is still taking albuterol 1 or twice daily.  Not taking Advair anymore. He prefers this current regimen, I advised him to call us if he is using more than twice albuterol daily and we will consider long-acting maintenance inhaler such as Advair or Breo

## 2021-01-26 NOTE — Progress Notes (Signed)
   Subjective:    Patient ID: Raymond Jacobs, male    DOB: 1951-01-24, 70 y.o.   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 He was last seen by me 70/2019  PMH -  03/2020 hosp for achalasia and duodenal ulcer perforation, developed perisplenic fluid collection requiring drainage  He was discharged on oxygen 2 L He works as a Curator and smoked about a pack per day for more than 40 years, quit 02/2020.  He also drinks 2 beers per day.  Chief Complaint  Patient presents with  . Follow-up    Breathing has improved, drinking more whiskey, affecting stomach    Last flare 08/2020 ED visit APP visit 07/2020 was reviewed, he was given Advair which he stopped using after a month.  He is back to using albuterol as needed which could be once or twice daily.  Reviewed PFTs 07/2020. 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  He has quit smoking now for 4 months. 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  Complete atelectasis of right middle and lower lobes  Spirometry 04/2018 mild airway obstruction with ratio 66, FEV1 83% and FVC 93%  08/11/2020 PFTs- FVC 4.05 (113%), FEV1 2.93 (112%), ratio 72, TLC 103%, DLCOunc 15.59 (71%)  08/11/2020 FENO - 69   09/12/20- Eos absolute 800  08/11/20 IgE 213   Review of Systems neg for any significant sore throat, dysphagia, itching, sneezing, nasal congestion or excess/ purulent secretions, fever, chills, sweats, unintended wt loss, pleuritic or exertional cp, hempoptysis, orthopnea pnd or change in  chronic leg swelling. Also denies presyncope, palpitations, heartburn, abdominal pain, nausea, vomiting, diarrhea or change in bowel or urinary habits, dysuria,hematuria, rash, arthralgias, visual complaints, headache, numbness weakness or ataxia.     Objective:   Physical Exam  Gen. Pleasant, thin man, in no distress ENT - no thrush, no pallor/icterus,no post nasal drip Neck: No JVD, no thyromegaly, no carotid bruits Lungs: no use of accessory muscles, no dullness to percussion, clear without rales or rhonchi  Cardiovascular: Rhythm regular, heart sounds  normal, no murmurs or gallops, no peripheral edema Musculoskeletal: No deformities, no cyanosis or clubbing        Assessment & Plan:

## 2021-01-26 NOTE — Addendum Note (Signed)
Addended byCoralie Keens on: 01/26/2021 10:20 AM   Modules accepted: Orders

## 2021-01-26 NOTE — Assessment & Plan Note (Signed)
Noted on previous CT.  Surprisingly airway obstruction noted on previous spirometry 04/2018 has resolved indicating that this may have been asthma are related to abdominal illness then.  -I congratulated him on smoking cessation. Referral to lung cancer screening program, does not seem to require maintenance inhaler at present. Maintain current activity levels

## 2021-01-26 NOTE — Patient Instructions (Signed)
Okay to use albuterol on as-needed basis. If you are using this more than twice every day, call us and we can place you on a longer acting maintenance inhaler like we discussed  Referred to lung cancer screening program

## 2021-02-12 ENCOUNTER — Telehealth: Payer: Self-pay | Admitting: Pulmonary Disease

## 2021-02-12 DIAGNOSIS — Z87891 Personal history of nicotine dependence: Secondary | ICD-10-CM

## 2021-02-12 NOTE — Telephone Encounter (Signed)
ATC, no answer, left VM 
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
## 2021-02-12 NOTE — Telephone Encounter (Signed)
Lm x1 for patient.  

## 2021-02-12 NOTE — Telephone Encounter (Signed)
No appointment available today. He should contact his PCP or gastroenterologist if having GI issues. Does not appear related to asthma or COPD. Defer to sarah if LDCT can be changed, likely will not be able to due to insurance.

## 2021-02-12 NOTE — Telephone Encounter (Signed)
Needs to be seen in the office today, or to seek emergency care if he cannot swallow.

## 2021-02-15 ENCOUNTER — Encounter: Payer: Self-pay | Admitting: *Deleted

## 2021-02-15 NOTE — Telephone Encounter (Signed)
LMTCB and will mail letter per protocol. 

## 2021-02-16 NOTE — Telephone Encounter (Signed)
Spoke with pt and scheduled SDMV 03/01/21 2:30 CT ordered Nothing further needed

## 2021-02-26 ENCOUNTER — Inpatient Hospital Stay (HOSPITAL_COMMUNITY)
Admission: EM | Admit: 2021-02-26 | Discharge: 2021-02-28 | DRG: 392 | Disposition: A | Discharge status: Home / Self Care | Payer: Medicare Other | Attending: Internal Medicine | Admitting: Internal Medicine

## 2021-02-26 ENCOUNTER — Emergency Department (HOSPITAL_COMMUNITY): Payer: Medicare Other

## 2021-02-26 ENCOUNTER — Encounter: Payer: Medicare Other | Admitting: Primary Care

## 2021-02-26 ENCOUNTER — Other Ambulatory Visit: Payer: Self-pay

## 2021-02-26 ENCOUNTER — Encounter (HOSPITAL_COMMUNITY): Payer: Self-pay

## 2021-02-26 ENCOUNTER — Telehealth: Payer: Self-pay | Admitting: Primary Care

## 2021-02-26 DIAGNOSIS — J449 Chronic obstructive pulmonary disease, unspecified: Secondary | ICD-10-CM | POA: Diagnosis present

## 2021-02-26 DIAGNOSIS — I700 Atherosclerosis of aorta: Secondary | ICD-10-CM | POA: Diagnosis present

## 2021-02-26 DIAGNOSIS — R1013 Epigastric pain: Secondary | ICD-10-CM

## 2021-02-26 DIAGNOSIS — R1010 Upper abdominal pain, unspecified: Secondary | ICD-10-CM

## 2021-02-26 DIAGNOSIS — Z8711 Personal history of peptic ulcer disease: Secondary | ICD-10-CM

## 2021-02-26 DIAGNOSIS — K279 Peptic ulcer, site unspecified, unspecified as acute or chronic, without hemorrhage or perforation: Secondary | ICD-10-CM

## 2021-02-26 DIAGNOSIS — K220 Achalasia of cardia: Principal | ICD-10-CM | POA: Diagnosis present

## 2021-02-26 DIAGNOSIS — Z800 Family history of malignant neoplasm of digestive organs: Secondary | ICD-10-CM

## 2021-02-26 DIAGNOSIS — R1011 Right upper quadrant pain: Secondary | ICD-10-CM

## 2021-02-26 DIAGNOSIS — K2970 Gastritis, unspecified, without bleeding: Secondary | ICD-10-CM | POA: Diagnosis present

## 2021-02-26 DIAGNOSIS — Z86010 Personal history of colonic polyps: Secondary | ICD-10-CM

## 2021-02-26 DIAGNOSIS — Z79899 Other long term (current) drug therapy: Secondary | ICD-10-CM

## 2021-02-26 DIAGNOSIS — Z87891 Personal history of nicotine dependence: Secondary | ICD-10-CM

## 2021-02-26 DIAGNOSIS — E871 Hypo-osmolality and hyponatremia: Secondary | ICD-10-CM | POA: Diagnosis present

## 2021-02-26 DIAGNOSIS — Z886 Allergy status to analgesic agent status: Secondary | ICD-10-CM

## 2021-02-26 DIAGNOSIS — Z20822 Contact with and (suspected) exposure to covid-19: Secondary | ICD-10-CM | POA: Diagnosis present

## 2021-02-26 DIAGNOSIS — R109 Unspecified abdominal pain: Secondary | ICD-10-CM | POA: Diagnosis present

## 2021-02-26 DIAGNOSIS — K269 Duodenal ulcer, unspecified as acute or chronic, without hemorrhage or perforation: Secondary | ICD-10-CM | POA: Diagnosis present

## 2021-02-26 DIAGNOSIS — K2990 Gastroduodenitis, unspecified, without bleeding: Secondary | ICD-10-CM | POA: Diagnosis present

## 2021-02-26 LAB — TROPONIN I (HIGH SENSITIVITY)
Troponin I (High Sensitivity): 4 ng/L (ref <18)
Troponin I (High Sensitivity): 3 ng/L (ref <18)

## 2021-02-26 LAB — CBC WITH DIFFERENTIAL/PLATELET
Abs Immature Granulocytes: 0.04 10*3/uL (ref 0.00–0.07)
Basophils Absolute: 0.1 10*3/uL (ref 0.0–0.1)
Basophils Relative: 1 %
Eosinophils Absolute: 0.2 10*3/uL (ref 0.0–0.5)
Eosinophils Relative: 2 %
HCT: 40.2 % (ref 39.0–52.0)
Hemoglobin: 13.5 g/dL (ref 13.0–17.0)
Immature Granulocytes: 0 %
Lymphocytes Relative: 24 %
Lymphs Abs: 2.4 10*3/uL (ref 0.7–4.0)
MCH: 28.6 pg (ref 26.0–34.0)
MCHC: 33.6 g/dL (ref 30.0–36.0)
MCV: 85.2 fL (ref 80.0–100.0)
Monocytes Absolute: 0.9 10*3/uL (ref 0.1–1.0)
Monocytes Relative: 9 %
Neutro Abs: 6.6 10*3/uL (ref 1.7–7.7)
Neutrophils Relative %: 64 %
Platelets: 439 10*3/uL — ABNORMAL HIGH (ref 150–400)
RBC: 4.72 MIL/uL (ref 4.22–5.81)
RDW: 12.6 % (ref 11.5–15.5)
WBC: 10.2 10*3/uL (ref 4.0–10.5)
nRBC: 0 % (ref 0.0–0.2)

## 2021-02-26 LAB — LIPASE, BLOOD: Lipase: 36 U/L (ref 11–51)

## 2021-02-26 LAB — COMPREHENSIVE METABOLIC PANEL
ALT: 15 U/L (ref 0–44)
AST: 18 U/L (ref 15–41)
Albumin: 3.9 g/dL (ref 3.5–5.0)
Alkaline Phosphatase: 65 U/L (ref 38–126)
Anion gap: 11 (ref 5–15)
BUN: 13 mg/dL (ref 8–23)
CO2: 23 mmol/L (ref 22–32)
Calcium: 9.8 mg/dL (ref 8.9–10.3)
Chloride: 94 mmol/L — ABNORMAL LOW (ref 98–111)
Creatinine, Ser: 0.71 mg/dL (ref 0.61–1.24)
GFR, Estimated: >60 mL/min (ref >60)
Glucose, Bld: 116 mg/dL — ABNORMAL HIGH (ref 70–99)
Potassium: 3.9 mmol/L (ref 3.5–5.1)
Sodium: 128 mmol/L — ABNORMAL LOW (ref 135–145)
Total Bilirubin: 0.8 mg/dL (ref 0.3–1.2)
Total Protein: 6.9 g/dL (ref 6.5–8.1)

## 2021-02-26 LAB — RESP PANEL BY RT-PCR (FLU A&B, COVID) ARPGX2
Influenza A by PCR: NEGATIVE
Influenza B by PCR: NEGATIVE
SARS Coronavirus 2 by RT PCR: NEGATIVE

## 2021-02-26 MED ORDER — MELATONIN 5 MG PO CAPS: 1.0000 | ORAL_CAPSULE | Freq: Every evening | ORAL | Status: DC | PRN

## 2021-02-26 MED ORDER — OXYCODONE-ACETAMINOPHEN 5-325 MG PO TABS
2.0000 | ORAL_TABLET | Freq: Once | ORAL | Status: AC
  Administered 2021-02-26: 2 via ORAL
  Filled 2021-02-26: qty 2

## 2021-02-26 MED ORDER — MELATONIN 5 MG PO TABS
5.0000 mg | ORAL_TABLET | Freq: Every evening | ORAL | Status: DC | PRN
  Administered 2021-02-27: 5 mg via ORAL
  Filled 2021-02-26 (×2): qty 1

## 2021-02-26 MED ORDER — FENTANYL CITRATE (PF) 100 MCG/2ML IJ SOLN
25.0000 ug | Freq: Once | INTRAMUSCULAR | Status: AC
  Administered 2021-02-26: 25 ug via INTRAVENOUS
  Filled 2021-02-26: qty 2

## 2021-02-26 MED ORDER — IOHEXOL 350 MG/ML SOLN
100.0000 mL | Freq: Once | INTRAVENOUS | Status: AC | PRN
  Administered 2021-02-26: 100 mL via INTRAVENOUS

## 2021-02-26 MED ORDER — ALBUTEROL SULFATE HFA 108 (90 BASE) MCG/ACT IN AERS
2.0000 | INHALATION_SPRAY | RESPIRATORY_TRACT | Status: DC | PRN
  Filled 2021-02-26: qty 6.7

## 2021-02-26 MED ORDER — IOHEXOL 300 MG/ML  SOLN
150.0000 mL | Freq: Once | INTRAMUSCULAR | Status: AC | PRN
  Administered 2021-02-26: 150 mL via ORAL

## 2021-02-26 MED ORDER — ALUM & MAG HYDROXIDE-SIMETH 200-200-20 MG/5ML PO SUSP
30.0000 mL | Freq: Once | ORAL | Status: AC
  Administered 2021-02-26: 30 mL via ORAL
  Filled 2021-02-26: qty 30

## 2021-02-26 MED ORDER — ONDANSETRON 4 MG PO TBDP
4.0000 mg | ORAL_TABLET | Freq: Once | ORAL | Status: AC
  Administered 2021-02-26: 4 mg via ORAL
  Filled 2021-02-26: qty 1

## 2021-02-26 MED ORDER — HYDROMORPHONE HCL 1 MG/ML IJ SOLN
0.5000 mg | INTRAMUSCULAR | Status: DC | PRN
Start: 2021-02-26 — End: 2021-02-28
  Administered 2021-02-26 – 2021-02-27 (×2): 0.5 mg via INTRAVENOUS
  Filled 2021-02-26 (×2): qty 1

## 2021-02-26 MED ORDER — PANTOPRAZOLE SODIUM 40 MG PO TBEC
40.0000 mg | DELAYED_RELEASE_TABLET | Freq: Every day | ORAL | Status: DC
  Administered 2021-02-26: 40 mg via ORAL
  Filled 2021-02-26: qty 1

## 2021-02-26 MED ORDER — NAPHAZOLINE-GLYCERIN 0.012-0.25 % OP SOLN
1.0000 [drp] | Freq: Every day | OPHTHALMIC | Status: DC | PRN
  Filled 2021-02-26: qty 15

## 2021-02-26 MED ORDER — FAMOTIDINE IN NACL 20-0.9 MG/50ML-% IV SOLN
20.0000 mg | Freq: Once | INTRAVENOUS | Status: AC
  Administered 2021-02-26: 20 mg via INTRAVENOUS
  Filled 2021-02-26: qty 50

## 2021-02-26 MED ORDER — ACETAMINOPHEN 650 MG RE SUPP: 650.0000 mg | Freq: Four times a day (QID) | RECTAL | Status: DC | PRN

## 2021-02-26 MED ORDER — ACETAMINOPHEN 325 MG PO TABS
650.0000 mg | ORAL_TABLET | Freq: Four times a day (QID) | ORAL | Status: DC | PRN
  Filled 2021-02-26: qty 2

## 2021-02-26 MED ORDER — SODIUM CHLORIDE 0.9 % IV BOLUS
1000.0000 mL | Freq: Once | INTRAVENOUS | Status: AC
  Administered 2021-02-26: 1000 mL via INTRAVENOUS

## 2021-02-26 MED ORDER — SODIUM CHLORIDE 0.9% FLUSH
3.0000 mL | Freq: Two times a day (BID) | INTRAVENOUS | Status: DC
  Administered 2021-02-26: 3 mL via INTRAVENOUS

## 2021-02-26 NOTE — ED Notes (Signed)
RN to room per patient request. Pt informed RN that their personal glasses are needed. RN informed patient about leaving main ED, and the corresponding consequences. Pt understands, informed RN that vehicle is in parking lot nearby. RN removed IV access. Patient to vehicle.
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
## 2021-02-26 NOTE — ED Notes (Signed)
Pt returned back to ED room

## 2021-02-26 NOTE — ED Provider Notes (Signed)
[facility] Provider Note   CSN: [id number] Arrival date & time: 02/26/21  0043     History Chief Complaint  Patient presents with  . Abdominal Pain    Raymond Jacobs is a 70 y.o. male with a past medical history of duodenal ulcer, achalasia, COPD presenting to the ED with a chief complaint of abdominal pain and chest pain.  States that she has had intermittent pain in the left upper quadrant and right upper quadrant for the past several months without any specific aggravating or alleviating factor.  Recently within the past day or so the pain has worsened.  Reports intermittent left-sided chest pain that minimally radiate to the back as well.  Takes daily famotidine as well as ibuprofen up to 3 times a day as needed for pain.  Admits to daily alcohol use but states that "I have cut back a lot, now it is just 1-2 beers and 1-2 white or black Russians."  Reports nausea but denies any vomiting.  No shortness of breath and feels like his breathing has been normal.  Denies any fevers.  No leg swelling.  He is concerned that this may be due to his achalasia.  HPI     Past Medical History:  Diagnosis Date  . Achalasia   . Aortic atherosclerosis ([place]) 04/08/2018   CT scan  . Duodenal ulcer    duodenal bulb  . Hiatal hernia   . Hyperplastic colon polyp   . Pleural effusion   . Stomach ulcer     Patient Active Problem List   Diagnosis Date Noted  . Asthma with COPD ([place]) 08/11/2020  . Food impaction of esophagus   . Esophageal dysphagia   . Smoking 08/13/2018  . Centrilobular emphysema ([place]) 05/09/2018  . Atelectasis   . Perforated duodenal ulcer ([place])   . Protein-calorie malnutrition, severe 03/31/2018  . Abdominal pain 03/30/2018  . 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 PUD (peptic ulcer disease) 08/15/2012    Past Surgical History:  Procedure Laterality Date  . BALLOON DILATION N/A 05/14/2019   Procedure: BALLOON DILATION;  Surgeon: Mauri Pole, MD;  Location: [facility];  Service: Endoscopy;  Laterality: N/A;  Rigiflex balloon dilation  . BIOPSY  08/21/2018   Procedure: BIOPSY;  Surgeon: Jerene Bears, MD;  Location: [facility];  Service: Gastroenterology;;  . Lum Keas INJECTION N/A 08/21/2018   Procedure: BOTOX INJECTION;  Surgeon: Jerene Bears, MD;  Location: [facility];  Service: Gastroenterology;  Laterality: N/A;  . ESOPHAGOGASTRODUODENOSCOPY (EGD) WITH PROPOFOL N/A 08/21/2018   Procedure: ESOPHAGOGASTRODUODENOSCOPY (EGD) WITH PROPOFOL;  Surgeon: Jerene Bears, MD;  Location: [facility];  Service: Gastroenterology;  Laterality: N/A;  . ESOPHAGOGASTRODUODENOSCOPY (EGD) WITH PROPOFOL N/A 05/14/2019   Procedure: ESOPHAGOGASTRODUODENOSCOPY (EGD) WITH PROPOFOL;  Surgeon: Mauri Pole, MD;  Location: [facility];  Service: Endoscopy;  Laterality: N/A;  pneumatic balloon dilation with Gastrografin 2 hours post procedure  . FOREIGN BODY REMOVAL  05/14/2019   Procedure: FOREIGN BODY REMOVAL;  Surgeon: Mauri Pole, MD;  Location: [facility];  Service: Endoscopy;;  . IR RADIOLOGIST EVAL & MGMT  04/24/2018       Family History  Problem Relation Age of Onset  . Colon cancer Brother   . Diabetes Brother   . Diabetes Maternal Aunt     Social History   Tobacco Use  . Smoking status: Former Smoker    Packs/day: 1.25    Years: 44.00    Pack years: 55.00  Types: Cigarettes    Start date: 16    Quit date: 03/01/2020    Years since quitting: 0.9  . Smokeless tobacco: Never Used  Vaping Use  . Vaping Use: Never used  Substance Use Topics  . Alcohol use: Yes    Comment: 1 beer and 3 shots vodka daily  . Drug use: Yes    Types: Marijuana    Home Medications Prior to Admission medications   Medication Sig Start Date End Date Taking? Authorizing Provider  albuterol (VENTOLIN HFA) 108 (90 Base) MCG/ACT inhaler Inhale 2 puffs into the lungs every 4 (four) hours as needed for wheezing or shortness of breath.  07/16/20 09/12/20  Martyn Ehrich, NP  Ascorbic Acid (VITAMIN C) 500 MG CAPS Take 500 mg by mouth 2 (two) times a week.    [provider]  B Complex-C-Folic Acid (SUPER B-COMPLEX/VIT C/FA) TABS Take 1 tablet by mouth 3 (three) times a week.     [provider]  Cholecalciferol (DIALYVITE VITAMIN D 5000) 125 MCG (5000 UT) capsule Take 5,000 Units by mouth 3 (three) times a week.     [provider]  ECHINACEA PO Take 1 tablet by mouth 3 (three) times a week.    [provider]  famotidine (PEPCID) 20 MG tablet Take 1 tablet (20 mg total) by mouth 2 (two) times daily. Patient taking differently: Take 20 mg by mouth daily. 07/17/18   Pyrtle, Lajuan Lines, MD  ferrous sulfate 325 (65 FE) MG tablet Take 325 mg by mouth 3 (three) times a week.     [provider]  IBUPROFEN PO Take 200 mg by mouth in the morning and at bedtime.     [provider]  Melatonin 5 MG CAPS Take 1 capsule (5 mg total) by mouth at bedtime as needed. 11/29/19   Martyn Ehrich, NP  Naphazoline-Glycerin (CLEAR EYES REDNESS RELIEF OP) Place 1 drop into both eyes daily as needed (redness/irritation).    [provider]  Nutritional Supplements (ENSURE MAX PROTEIN) LIQD Take 1 each by mouth 3 (three) times daily. Please dispense 90 bottles per month Patient taking differently: Take 1 each by mouth 2 (two) times a day. Please dispense 90 bottles per month 06/26/18   Clent Demark, PA-C  Omega-3 Fatty Acids (FISH OIL) 500 MG CAPS Take 1,000 mg by mouth 3 (three) times a week.     [provider]  Vitamin A 2400 MCG (8000 UT) CAPS Take 8,000 Units by mouth 3 (three) times a week.     [provider]    Allergies    Asa [aspirin] and Other  Review of Systems   Review of Systems  Constitutional: Negative for appetite change, chills and fever.  HENT: Negative for ear pain, rhinorrhea, sneezing and sore throat.   Eyes: Negative for photophobia and visual  disturbance.  Respiratory: Negative for cough, chest tightness, shortness of breath and wheezing.   Cardiovascular: Positive for chest pain. Negative for palpitations.  Gastrointestinal: Positive for abdominal pain and nausea. Negative for blood in stool, constipation, diarrhea and vomiting.  Genitourinary: Negative for dysuria, hematuria and urgency.  Musculoskeletal: Positive for back pain. Negative for myalgias.  Skin: Negative for rash.  Neurological: Negative for dizziness, weakness and light-headedness.    Physical Exam Updated Vital Signs BP 118/80   Pulse 73   Temp 97.6 F (36.4 C) (Oral)   Resp 11   Ht 5\' 4"  (1.626 m)   Wt 54.4 kg  SpO2 92%   BMI 20.60 kg/m   Physical Exam Vitals and nursing note reviewed.  Constitutional:      General: He is not in acute distress.    Appearance: He is well-developed.     Comments: Speaking in complete sentences without difficulty.  HENT:     Head: Normocephalic and atraumatic.     Nose: Nose normal.  Eyes:     General: No scleral icterus.       Left eye: No discharge.     Conjunctiva/sclera: Conjunctivae normal.  Cardiovascular:     Rate and Rhythm: Normal rate and regular rhythm.     Heart sounds: Normal heart sounds. No murmur heard. No friction rub. No gallop.   Pulmonary:     Effort: Pulmonary effort is normal. No respiratory distress.     Breath sounds: Normal breath sounds.  Abdominal:     General: Bowel sounds are normal. There is no distension.     Palpations: Abdomen is soft.     Tenderness: There is abdominal tenderness in the right upper quadrant, epigastric area and left upper quadrant. There is no guarding.  Musculoskeletal:        General: Normal range of motion.     Cervical back: Normal range of motion and neck supple.     Right lower leg: No edema.     Left lower leg: No edema.  Skin:    General: Skin is warm and dry.     Findings: No rash.  Neurological:     Mental Status: He is alert.     Motor:  No abnormal muscle tone.     Coordination: Coordination normal.     ED Results / Procedures / Treatments   Labs (all labs ordered are listed, but only abnormal results are displayed) Labs Reviewed  CBC WITH DIFFERENTIAL/PLATELET - Abnormal; Notable for the following components:      Result Value   Platelets 439 (*)    All other components within normal limits  COMPREHENSIVE METABOLIC PANEL - Abnormal; Notable for the following components:   Sodium 128 (*)    Chloride 94 (*)    Glucose, Bld 116 (*)    All other components within normal limits  LIPASE, BLOOD  TROPONIN I (HIGH SENSITIVITY)  TROPONIN I (HIGH SENSITIVITY)    EKG None  Radiology DG Chest 2 View  Result Date: 02/26/2021 CLINICAL DATA:  Intermittent lower rib pain and back pain x1 month. EXAM: CHEST - 2 VIEW COMPARISON:  September 12, 2020 FINDINGS: The lungs are hyperinflated. Mild areas of scarring and/or atelectasis are seen within the bilateral lung bases. There is no evidence of a pleural effusion or pneumothorax. The cardiac silhouette is mildly enlarged. Mild calcification of the aortic arch is noted. Dilatation of the distal esophagus is seen overlying the medial aspect of the right lung base. The visualized skeletal structures are unremarkable. IMPRESSION: Stable exam without active cardiopulmonary disease. Electronically Signed   By: Virgina Norfolk M.D.   On: 02/26/2021 02:34   CT Angio Chest/Abd/Pel for Dissection W and/or W/WO  Result Date: 02/26/2021 CLINICAL DATA:  Abdominal pain with aortic dissection suspected EXAM: CT ANGIOGRAPHY CHEST, ABDOMEN AND PELVIS TECHNIQUE: Non-contrast CT of the chest was initially obtained. Multidetector CT imaging through the chest, abdomen and pelvis was performed using the standard protocol during bolus administration of intravenous contrast. Multiplanar reconstructed images and MIPs were obtained and reviewed to evaluate the vascular anatomy. CONTRAST:  165mL OMNIPAQUE IOHEXOL  350 MG/ML SOLN COMPARISON:  Abdominal CT 07/30/2018 FINDINGS: CTA CHEST FINDINGS Cardiovascular: Preferential opacification of the thoracic aorta. No evidence of thoracic aortic aneurysm or dissection. Normal heart size. No pericardial effusion. Aortic and coronary atherosclerosis. Mediastinum/Nodes: History of achalasia with dilated esophagus and superimposed sutures inferiorly. There is internal fluid/debris which was also seen previously. No superimposed inflammation. Lungs/Pleura: Central airways are clear. Centrilobular and panlobular emphysema at the apices. Mild scarring at the bases. There is no edema, consolidation, effusion, or pneumothorax. Musculoskeletal: No acute finding. Review of the MIP images confirms the above findings. CTA ABDOMEN AND PELVIS FINDINGS VASCULAR Aorta: Atheromatous plaque which is diffuse. No dissection or aneurysm Celiac: Unremarkable SMA: Unremarkable Renals: No pathologic finding. Accessory left lower pole renal artery. IMA: Patent Inflow: Atheromatous plaque without stenosis or dissection. Negative for aneurysm Veins: Unremarkable in the arterial phase Review of the MIP images confirms the above findings. NON-VASCULAR Hepatobiliary: No focal liver abnormality.No evidence of biliary obstruction or stone. Pancreas: Unremarkable. Spleen: Unremarkable. Adrenals/Urinary Tract: Negative adrenals. No hydronephrosis or stone. Unremarkable bladder. Stomach/Bowel: Gas filled outpouching superiorly at the duodenal bulb with regional fat haziness. No pneumoperitoneum, obstruction, or small bowel/colonic wall thickening. No appendicitis. Lymphatic: No mass or adenopathy. Reproductive:No pathologic findings. Other: No ascites or pneumoperitoneum. Musculoskeletal: No acute abnormalities. Advanced lumbar spine degeneration with sclerosis. Review of the MIP images confirms the above findings. IMPRESSION: 1. Suspect peptic ulcer disease at the duodenal bulb. A superior gas filled outpouching does  not have a perceptible wall, but no generalized pneumoperitoneum to suggest free perforation. 2. Negative for acute aortic syndrome. 3. Dilated esophagus with fluid correlating with history of achalasia. 4. Aortic Atherosclerosis (ICD10-I70.0) and Emphysema (ICD10-J43.9). Electronically Signed   By: Monte Fantasia M.D.   On: 02/26/2021 09:47   US Abdomen Limited RUQ (LIVER/GB)  Result Date: 02/26/2021 CLINICAL DATA:  Right upper quadrant pain. EXAM: ULTRASOUND ABDOMEN LIMITED RIGHT UPPER QUADRANT COMPARISON:  None. FINDINGS: Gallbladder: A 1.1 cm nonshadowing echogenic focus is seen within the gallbladder lumen. No gallstones or wall thickening visualized (1.58 mm). No sonographic Murphy sign noted by sonographer. Common bile duct: Diameter: 3.22 mm Liver: No focal lesion identified. Within normal limits in parenchymal echogenicity. Portal vein is patent on color Doppler imaging with normal direction of blood flow towards the liver. Other: None. IMPRESSION: Suspected 1.1 cm gallbladder polyp, without evidence of cholelithiasis or acute cholecystitis. While this may represent a benign polyp, and adenoma versus small neoplasm cannot be excluded. Further evaluation with surgical consultation is recommended. Reference: J Am Coll Radiol 2013;10:953-956. Electronically Signed   By: Virgina Norfolk M.D.   On: 02/26/2021 02:29    Procedures Procedures   Medications Ordered in ED Medications  ondansetron (ZOFRAN-ODT) disintegrating tablet 4 mg (4 mg Oral Given 02/26/21 0107)  oxyCODONE-acetaminophen (PERCOCET/ROXICET) 5-325 MG per tablet 2 tablet (2 tablets Oral Given 02/26/21 0106)  famotidine (PEPCID) IVPB 20 mg premix (0 mg Intravenous Stopped 02/26/21 0900)  fentaNYL (SUBLIMAZE) injection 25 mcg (25 mcg Intravenous Given 02/26/21 0735)  sodium chloride 0.9 % bolus 1,000 mL (0 mLs Intravenous Stopped 02/26/21 0900)  alum & mag hydroxide-simeth (MAALOX/MYLANTA) 200-200-20 MG/5ML suspension 30 mL (30 mLs Oral Given  02/26/21 0735)  iohexol (OMNIPAQUE) 350 MG/ML injection 100 mL (100 mLs Intravenous Contrast Given 02/26/21 0919)    ED Course  I have reviewed the triage vital signs and the nursing notes.  Pertinent labs & imaging results that were available during my care of the patient were reviewed by me and considered in my medical decision  making (see chart for details).  Clinical Course as of 02/26/21 1522  Fri Feb 26, 2021  1016 Consulted general surgery PA regarding patient's CT scan findings.  She will speak to attending and call me back with any further recommendations. [HK]  1116 Troponin I (High Sensitivity): 3 [HK]  1436 Sodium(!): 128 Chronic [HK]    Clinical Course User Index [HK] Delia Heady, PA-C   MDM Rules/Calculators/A&P                          70 year old male with past medical history of COPD, achalasia and prior duodenal ulcer presenting to the ED for intermittent abdominal pain that worsened within the past day.  Also reports left-sided chest pain that has been intermittent.  This will sometimes radiate to his back.  Denies any changes in breathing.  Reports nausea but denies any vomiting.  Has a history of achalasia and is concerned that this may be due to worsening achalasia.  He takes famotidine as well as ibuprofen on a daily basis.  He states that he was told about 2 to 3 years ago that his ulcer healed up and that he was able to take ibuprofen.  He denies any leg swelling, bloody stools.  On exam there is tenderness of the upper abdomen as well as the left chest.  No signs of respiratory distress.  No lower extremity edema.  Lab work done in triage shows unremarkable CBC, lipase.  CMP with hyponatremia of 128 but this appears chronic based on prior values.  Chest x-ray without any acute findings.  Ultrasound of the right upper quadrant shows a small possible gallbladder polyp.  Will obtain troponin, EKG and reassess after medications.  Troponin is negative x2.  EKG without any  ischemic changes.  Patient reports significant improvement after medications.  CT dissection study shows possible peptic ulcer disease with a gas-filled outpouching in the area without any evidence of perforation.  I consulted general surgery who evaluated patient at the bedside and will give further recommendations after obtaining upper GI series.    Portions of this note were generated with [employer]. Dictation errors may occur despite best attempts at proofreading.  Final Clinical Impression(s) / ED Diagnoses Final diagnoses:  RUQ pain  Duodenal ulcer    Rx / DC Orders ED Discharge Orders    None       Delia Heady, PA-C 02/26/21 1522    Lucrezia Starch, MD 02/26/21 2047

## 2021-02-26 NOTE — ED Notes (Signed)
Patient transported to CT 

## 2021-02-26 NOTE — ED Provider Notes (Addendum)
3:50 PM BP 122/75 (BP Location: Left Arm)   Pulse 73   Temp 97.6 F (36.4 C) (Oral)   Resp 12   Ht 5\' 4"  (1.626 m)   Wt 54.4 kg   SpO2 97%   BMI 20.58 kg/m  70 year old gentleman taken at shift change from [place].  He is here with abdominal pain.  His pain is currently controlled.  He is getting upper GI studies that were ordered by general surgery for an abnormal finding on CT scan.  Currently awaiting those studies and reevaluation with recommendations from surgery.   Surgery recommends GI consult. Case discussed with Dr.Mansouraty who would like the patient to be observed over night and then a suspect he will have EGD tomorrow morning.  Patient stable throughout his visit.  Case discussed with Dr. Trilby Drummer who will admit the patient for TRH.    Margarita Mail, PA-C 02/26/21 2335    Quintella Reichert, MD 02/27/21 [phone]

## 2021-02-26 NOTE — Telephone Encounter (Addendum)
Patient currently admitted, needs shared decision visit for lung cancer screening rescheduled
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
## 2021-02-26 NOTE — ED Notes (Signed)
Pt to xray

## 2021-02-26 NOTE — ED Triage Notes (Signed)
Intermittent lower rib pain and back pain x 1 month. Denies any fever and cough.

## 2021-02-26 NOTE — ED Provider Notes (Signed)
MSE was initiated and I personally evaluated the patient and placed orders (if any) at  12:57 AM on Feb 26, 2021.  Patient here with right sided rib/RUQ pain.  Has been intermittent.  No associated with vomiting.  Worsened today.    Alert and oriented No respiratory distress RUQ TTP  Labs and imaging ordered.  Discussed with patient that their care has been initiated.   They are counseled that they will need to remain in the ED until the completion of their workup, including full H&P and results of any tests.  Risks of leaving the emergency department prior to completion of treatment were discussed. Patient was advised to inform ED staff if they are leaving before their treatment is complete. The patient acknowledged these risks and time was allowed for questions.    The patient appears stable so that the remainder of the MSE may be completed by another provider.    Montine Circle, PA-C 02/26/21 [id number]    Margette Fast, MD 02/26/21 [phone]

## 2021-02-27 DIAGNOSIS — I700 Atherosclerosis of aorta: Secondary | ICD-10-CM | POA: Diagnosis present

## 2021-02-27 DIAGNOSIS — Z800 Family history of malignant neoplasm of digestive organs: Secondary | ICD-10-CM | POA: Diagnosis not present

## 2021-02-27 DIAGNOSIS — Z20822 Contact with and (suspected) exposure to covid-19: Secondary | ICD-10-CM | POA: Diagnosis present

## 2021-02-27 DIAGNOSIS — K279 Peptic ulcer, site unspecified, unspecified as acute or chronic, without hemorrhage or perforation: Secondary | ICD-10-CM | POA: Diagnosis not present

## 2021-02-27 DIAGNOSIS — K2970 Gastritis, unspecified, without bleeding: Secondary | ICD-10-CM | POA: Diagnosis present

## 2021-02-27 DIAGNOSIS — K2990 Gastroduodenitis, unspecified, without bleeding: Secondary | ICD-10-CM | POA: Diagnosis present

## 2021-02-27 DIAGNOSIS — R1010 Upper abdominal pain, unspecified: Secondary | ICD-10-CM | POA: Diagnosis not present

## 2021-02-27 DIAGNOSIS — Z86010 Personal history of colonic polyps: Secondary | ICD-10-CM | POA: Diagnosis not present

## 2021-02-27 DIAGNOSIS — Z79899 Other long term (current) drug therapy: Secondary | ICD-10-CM | POA: Diagnosis not present

## 2021-02-27 DIAGNOSIS — E871 Hypo-osmolality and hyponatremia: Secondary | ICD-10-CM | POA: Diagnosis present

## 2021-02-27 DIAGNOSIS — K269 Duodenal ulcer, unspecified as acute or chronic, without hemorrhage or perforation: Secondary | ICD-10-CM | POA: Diagnosis present

## 2021-02-27 DIAGNOSIS — Z87891 Personal history of nicotine dependence: Secondary | ICD-10-CM | POA: Diagnosis not present

## 2021-02-27 DIAGNOSIS — J449 Chronic obstructive pulmonary disease, unspecified: Secondary | ICD-10-CM | POA: Diagnosis present

## 2021-02-27 DIAGNOSIS — R1013 Epigastric pain: Secondary | ICD-10-CM | POA: Diagnosis present

## 2021-02-27 DIAGNOSIS — K220 Achalasia of cardia: Secondary | ICD-10-CM | POA: Diagnosis present

## 2021-02-27 DIAGNOSIS — Z886 Allergy status to analgesic agent status: Secondary | ICD-10-CM | POA: Diagnosis not present

## 2021-02-27 DIAGNOSIS — Z8711 Personal history of peptic ulcer disease: Secondary | ICD-10-CM | POA: Diagnosis not present

## 2021-02-27 LAB — BASIC METABOLIC PANEL
Anion gap: 8 (ref 5–15)
BUN: 11 mg/dL (ref 8–23)
CO2: 26 mmol/L (ref 22–32)
Calcium: 9 mg/dL (ref 8.9–10.3)
Chloride: 99 mmol/L (ref 98–111)
Creatinine, Ser: 0.79 mg/dL (ref 0.61–1.24)
GFR, Estimated: >60 mL/min (ref >60)
Glucose, Bld: 102 mg/dL — ABNORMAL HIGH (ref 70–99)
Potassium: 3.7 mmol/L (ref 3.5–5.1)
Sodium: 133 mmol/L — ABNORMAL LOW (ref 135–145)

## 2021-02-27 LAB — CBC
HCT: 39.3 % (ref 39.0–52.0)
HCT: 42.3 % (ref 39.0–52.0)
Hemoglobin: 13.4 g/dL (ref 13.0–17.0)
Hemoglobin: 14 g/dL (ref 13.0–17.0)
MCH: 29.2 pg (ref 26.0–34.0)
MCH: 28.7 pg (ref 26.0–34.0)
MCHC: 34.1 g/dL (ref 30.0–36.0)
MCHC: 33.1 g/dL (ref 30.0–36.0)
MCV: 85.6 fL (ref 80.0–100.0)
MCV: 86.7 fL (ref 80.0–100.0)
Platelets: 400 10*3/uL (ref 150–400)
Platelets: 431 10*3/uL — ABNORMAL HIGH (ref 150–400)
RBC: 4.59 MIL/uL (ref 4.22–5.81)
RBC: 4.88 MIL/uL (ref 4.22–5.81)
RDW: 12.9 % (ref 11.5–15.5)
RDW: 12.9 % (ref 11.5–15.5)
WBC: 7.5 10*3/uL (ref 4.0–10.5)
WBC: 8 10*3/uL (ref 4.0–10.5)
nRBC: 0 % (ref 0.0–0.2)
nRBC: 0 % (ref 0.0–0.2)

## 2021-02-27 LAB — COMPREHENSIVE METABOLIC PANEL
ALT: 10 U/L (ref 0–44)
AST: 20 U/L (ref 15–41)
Albumin: 3.9 g/dL (ref 3.5–5.0)
Alkaline Phosphatase: 62 U/L (ref 38–126)
Anion gap: 9 (ref 5–15)
BUN: 11 mg/dL (ref 8–23)
CO2: 25 mmol/L (ref 22–32)
Calcium: 8.9 mg/dL (ref 8.9–10.3)
Chloride: 98 mmol/L (ref 98–111)
Creatinine, Ser: 0.88 mg/dL (ref 0.61–1.24)
GFR, Estimated: >60 mL/min (ref >60)
Glucose, Bld: 87 mg/dL (ref 70–99)
Potassium: 4 mmol/L (ref 3.5–5.1)
Sodium: 132 mmol/L — ABNORMAL LOW (ref 135–145)
Total Bilirubin: 1.4 mg/dL — ABNORMAL HIGH (ref 0.3–1.2)
Total Protein: 7.1 g/dL (ref 6.5–8.1)

## 2021-02-27 LAB — PHOSPHORUS: Phosphorus: 2.9 mg/dL (ref 2.5–4.6)

## 2021-02-27 LAB — HIV ANTIBODY (ROUTINE TESTING W REFLEX): HIV Screen 4th Generation wRfx: NONREACTIVE

## 2021-02-27 LAB — MAGNESIUM: Magnesium: 2.1 mg/dL (ref 1.7–2.4)

## 2021-02-27 MED ORDER — FOLIC ACID 1 MG PO TABS
1.0000 mg | ORAL_TABLET | Freq: Every day | ORAL | Status: DC
  Administered 2021-02-28: 1 mg via ORAL
  Filled 2021-02-27: qty 1

## 2021-02-27 MED ORDER — LORAZEPAM 1 MG PO TABS: 1.0000 mg | ORAL_TABLET | ORAL | Status: DC | PRN

## 2021-02-27 MED ORDER — THIAMINE HCL 100 MG/ML IJ SOLN
100.0000 mg | Freq: Every day | INTRAMUSCULAR | Status: DC
  Administered 2021-02-27: 100 mg via INTRAVENOUS
  Filled 2021-02-27: qty 2

## 2021-02-27 MED ORDER — THIAMINE HCL 100 MG PO TABS
100.0000 mg | ORAL_TABLET | Freq: Every day | ORAL | Status: DC
  Administered 2021-02-28: 100 mg via ORAL
  Filled 2021-02-27: qty 1

## 2021-02-27 MED ORDER — SODIUM CHLORIDE 0.9 % IV SOLN: INTRAVENOUS | Status: DC

## 2021-02-27 MED ORDER — ADULT MULTIVITAMIN W/MINERALS CH
1.0000 | ORAL_TABLET | Freq: Every day | ORAL | Status: DC
  Administered 2021-02-28: 1 via ORAL
  Filled 2021-02-27: qty 1

## 2021-02-27 MED ORDER — LORAZEPAM 2 MG/ML IJ SOLN: 1.0000 mg | INTRAMUSCULAR | Status: DC | PRN

## 2021-02-27 NOTE — H&P (View-Only) (Signed)
Consultation  Referring Provider:     Neva Seat, MD Primary Care Physician:  Patient, No Pcp Per (Inactive) Primary Gastroenterologist:      Dr. Silverio Decamp Reason for Consultation:     Upper abdominal pain, abnormal imaging         HPI:   Raymond Jacobs is a 70 y.o. male with a history of achalasia s/p Botox and 2019 and 30 mm pneumatic dilation 04/2019, along with history of asthma, COPD, PUD, presents with upper abdominal pain.  States he has been having intermittent upper abdominal pain for couple of months, but increased pain for the last 1-2 days.  Has been taking Pepcid as prescribed.  Does take NSAIDs.  Pain was nonradiating.  No melena, hematochezia, fever, chills.  No nausea/vomiting.  Maintains very active lifestyle.  Endoscopic history notable for the following: - EGD (07/2012): Moderate to severe esophageal dilation with fluid retention, moderate LES resistance, moderate gastritis - EGD (07/2018): Tortuous esophagus with retained food, no esophageal motility noted, tight LES injected with Botox - EGD (04/2019): Food in esophagus, tight GE junction dilated with 30 mm pneumatic balloon.  Tortuous lower esophagus.  Normal stomach and duodenum  Patient states had minimal improvement with prior Botox injection, but improvement in his achalasia symptoms with prior pneumatic dilation.  Has started having intermittent dysphagia symptoms again for the last couple of months, but has not sought follow-up in the GI clinic since 2020.  He is considering POEM at some point.  Admission evaluation notable for the following: - NA 133 otherwise normal BMP - Normal CBC - Normal/negative troponins, lipase - Normal liver enzymes - Started on Protonix 40 mg/day - CT with inflammation about the duodenal bulb suspicious for PUD.  Dilated esophagus consistent with known achalasia - UGI series without perforation.  Study was limited due to limited opacification of the stomach/small intestine due to  achalasia  General Surgery service consulted with recommendation for UGI series (results above) and GI consult.  No evidence of perforation or need for surgical intervention.  Past Medical History:  Diagnosis Date  . Achalasia   . Aortic atherosclerosis ([place]) 04/08/2018   CT scan  . Duodenal ulcer    duodenal bulb  . Hiatal hernia   . Hyperplastic colon polyp   . Pleural effusion   . Stomach ulcer     Past Surgical History:  Procedure Laterality Date  . BALLOON DILATION N/A 05/14/2019   Procedure: BALLOON DILATION;  Surgeon: Mauri Pole, MD;  Location: [facility];  Service: Endoscopy;  Laterality: N/A;  Rigiflex balloon dilation  . BIOPSY  08/21/2018   Procedure: BIOPSY;  Surgeon: Jerene Bears, MD;  Location: [facility];  Service: Gastroenterology;;  . Lum Keas INJECTION N/A 08/21/2018   Procedure: BOTOX INJECTION;  Surgeon: Jerene Bears, MD;  Location: [facility];  Service: Gastroenterology;  Laterality: N/A;  . ESOPHAGOGASTRODUODENOSCOPY (EGD) WITH PROPOFOL N/A 08/21/2018   Procedure: ESOPHAGOGASTRODUODENOSCOPY (EGD) WITH PROPOFOL;  Surgeon: Jerene Bears, MD;  Location: [facility];  Service: Gastroenterology;  Laterality: N/A;  . ESOPHAGOGASTRODUODENOSCOPY (EGD) WITH PROPOFOL N/A 05/14/2019   Procedure: ESOPHAGOGASTRODUODENOSCOPY (EGD) WITH PROPOFOL;  Surgeon: Mauri Pole, MD;  Location: [facility];  Service: Endoscopy;  Laterality: N/A;  pneumatic balloon dilation with Gastrografin 2 hours post procedure  . FOREIGN BODY REMOVAL  05/14/2019   Procedure: FOREIGN BODY REMOVAL;  Surgeon: Mauri Pole, MD;  Location: [facility];  Service: Endoscopy;;  . IR RADIOLOGIST EVAL & MGMT  04/24/2018  Family History  Problem Relation Age of Onset  . Colon cancer Brother   . Diabetes Brother   . Diabetes Maternal Aunt      Social History   Tobacco Use  . Smoking status: Former Smoker    Packs/day: 1.25    Years: 44.00    Pack years: 55.00    Types:  Cigarettes    Start date: 54    Quit date: 03/01/2020    Years since quitting: 0.9  . Smokeless tobacco: Never Used  Vaping Use  . Vaping Use: Never used  Substance Use Topics  . Alcohol use: Yes    Comment: 1 beer and 3 shots vodka daily  . Drug use: Yes    Types: Marijuana    Prior to Admission medications   Medication Sig Start Date End Date Taking? Authorizing Provider  albuterol (VENTOLIN HFA) 108 (90 Base) MCG/ACT inhaler Inhale 2 puffs into the lungs every 4 (four) hours as needed for wheezing or shortness of breath. 07/16/20 09/12/20 Yes Martyn Ehrich, NP  Ascorbic Acid (VITAMIN C) 500 MG CAPS Take 500 mg by mouth 3 (three) times a week.   Yes [provider]  B Complex-C-Folic Acid (SUPER B-COMPLEX/VIT C/FA) TABS Take 1 tablet by mouth 4 (four) times a week.   Yes [provider]  Cholecalciferol (VITAMIN D3) 125 MCG (5000 UT) CAPS Take 5,000 Units by mouth See admin instructions. Take 5,000 units by mouth five times weekly   Yes [provider]  Cyanocobalamin (VITAMIN B12 PO) Take 1 tablet by mouth See admin instructions. Take 1 tablet by mouth five times a week   Yes [provider]  ECHINACEA PO Take 1 tablet by mouth in the morning.   Yes [provider]  famotidine (PEPCID) 20 MG tablet Take 1 tablet (20 mg total) by mouth 2 (two) times daily. 07/17/18  Yes Pyrtle, Lajuan Lines, MD  ferrous sulfate 325 (65 FE) MG tablet Take 325 mg by mouth 3 (three) times a week.    Yes [provider]  Melatonin 5 MG CAPS Take 1 capsule (5 mg total) by mouth at bedtime as needed. Patient taking differently: Take 5 mg by mouth at bedtime as needed (for sleep). 11/29/19  Yes Martyn Ehrich, NP  Naphazoline-Glycerin (CLEAR EYES REDNESS RELIEF OP) Place 1 drop into both eyes 2 (two) times daily.   Yes [provider]  Nutritional Supplements (ENSURE MAX PROTEIN) LIQD Take 1 each by mouth 3 (three) times daily. Please dispense 90  bottles per month Patient taking differently: Take 11 oz by mouth 2 (two) times a day. 06/26/18  Yes Clent Demark, PA-C  Omega-3 Fatty Acids (FISH OIL) 500 MG CAPS Take 1,000 mg by mouth See admin instructions. Take 1,000 mg by mouth five times a week   Yes [provider]  Vitamin A 2400 MCG (8000 UT) CAPS Take 8,000 Units by mouth See admin instructions. Take 8,000 units by mouth five times a week   Yes [provider]  Cholecalciferol (DIALYVITE VITAMIN D 5000) 125 MCG (5000 UT) capsule Take 5,000 Units by mouth 3 (three) times a week.  Patient not taking: Reported on 02/26/2021    [provider]  IBUPROFEN PO Take 200 mg by mouth in the morning and at bedtime.  Patient not taking: Reported on 02/26/2021    [provider]    Current Facility-Administered Medications  Medication Dose Route Frequency Provider Last Rate Last Admin  . 0.9 %  sodium chloride infusion   Intravenous Continuous Mansouraty, Telford Nab., MD      . acetaminophen (TYLENOL) tablet 650 mg  650 mg Oral Q6H PRN Marcelyn Bruins, MD       Or  . acetaminophen (TYLENOL) suppository 650 mg  650 mg Rectal Q6H PRN Marcelyn Bruins, MD      . albuterol (VENTOLIN HFA) 108 (90 Base) MCG/ACT inhaler 2 puff  2 puff Inhalation Q4H PRN Marcelyn Bruins, MD      . HYDROmorphone (DILAUDID) injection 0.5 mg  0.5 mg Intravenous Q4H PRN Marcelyn Bruins, MD   0.5 mg at 02/26/21 2127  . melatonin tablet 5 mg  5 mg Oral QHS PRN Llana Aliment, RPH      . naphazoline-glycerin (CLEAR EYES REDNESS) ophth solution 1-2 drop  1-2 drop Both Eyes Daily PRN Marcelyn Bruins, MD      . pantoprazole (PROTONIX) EC tablet 40 mg  40 mg Oral Daily Marcelyn Bruins, MD   40 mg at 02/26/21 2127  . sodium chloride flush (NS) 0.9 % injection 3 mL  3 mL Intravenous Q12H Marcelyn Bruins, MD   3 mL at 02/26/21 2233    Allergies as of 02/26/2021 - Review Complete 02/26/2021  Allergen Reaction Noted  .  Asa [aspirin] Other (See Comments) 03/30/2018  . Other Swelling and Other (See Comments) 03/30/2018     Review of Systems:    As per HPI, otherwise negative    Physical Exam:  Vital signs in last 24 hours: Temp:  [97.8 F (36.6 C)-98 F (36.7 C)] 98 F (36.7 C) (05/07 0455) Pulse Rate:  [69-83] 79 (05/07 0455) Resp:  [11-18] 16 (05/07 0455) BP: (102-124)/(69-84) 102/73 (05/07 0455) SpO2:  [92 %-97 %] 95 % (05/07 0455) Weight:  [52.6 kg-53.1 kg] 53.1 kg (05/07 0455)   General:   Pleasant male in NAD Head:  Normocephalic and atraumatic. Eyes:   No icterus.   Conjunctiva pink. Ears:  Normal auditory acuity. Neck:  Supple Lungs:  Respirations even and unlabored. Lungs clear to auscultation bilaterally.   No wheezes, crackles, or rhonchi.  Heart:  Regular rate and rhythm; no MRG Abdomen:  Soft, nondistended, nontender. Normal bowel sounds. No appreciable masses or hepatomegaly.  Rectal:  Not performed.  Msk:  Symmetrical without gross deformities.  Extremities:  Without edema. Neurologic:  Alert and  oriented x4;  grossly normal neurologically. Skin:  Intact without significant lesions or rashes. Psych:  Alert and cooperative. Normal affect.  LAB RESULTS: Recent Labs    02/26/21 0118 02/27/21 0010  WBC 10.2 7.5  HGB 13.5 13.4  HCT 40.2 39.3  PLT 439* 400   BMET Recent Labs    02/26/21 0118 02/27/21 0010  NA 128* 133*  K 3.9 3.7  CL 94* 99  CO2 23 26  GLUCOSE 116* 102*  BUN 13 11  CREATININE 0.71 0.79  CALCIUM 9.8 9.0   LFT Recent Labs    02/26/21 0118  PROT 6.9  ALBUMIN 3.9  AST 18  ALT 15  ALKPHOS 65  BILITOT 0.8   PT/INR No results for input(s): LABPROT, INR in the last 72 hours.  STUDIES: DG Chest 2 View  Result Date: 02/26/2021 CLINICAL DATA:  Intermittent lower rib pain and back pain x1 month. EXAM: CHEST - 2 VIEW COMPARISON:  September 12, 2020 FINDINGS: The lungs are hyperinflated. Mild areas of scarring and/or atelectasis are seen within  the bilateral lung bases. There is no evidence  of a pleural effusion or pneumothorax. The cardiac silhouette is mildly enlarged. Mild calcification of the aortic arch is noted. Dilatation of the distal esophagus is seen overlying the medial aspect of the right lung base. The visualized skeletal structures are unremarkable. IMPRESSION: Stable exam without active cardiopulmonary disease. Electronically Signed   By: Virgina Norfolk M.D.   On: 02/26/2021 02:34   DG UGI W SINGLE CM (SOL OR THIN BA)  Result Date: 02/26/2021 CLINICAL DATA:  70 year old male with history of abdominal pain. Possible duodenal ulcer noted on recent CT examination. EXAM: UPPER GI SERIES WITHOUT KUB TECHNIQUE: Routine upper GI series was performed with water-soluble contrast material. FLUOROSCOPY TIME:  Fluoroscopy Time:  4 minutes and 30 seconds Radiation Exposure Index (if provided by the fluoroscopic device): 34.2 mGy COMPARISON:  Esophagram 05/14/2019. FINDINGS: Limited single contrast water-soluble esophagram was performed. Within the limitations of today's examination, the esophagus was diffusely dilated and aperistaltic, compatible with patient's known history of achalasia. Marked narrowing of the distal esophagus immediately proximal to the level of the gastroesophageal junction resulted in limited opacification of the stomach, which appeared grossly normal in appearance. Very limited contrast was visualized extending from the stomach into the duodenum, limiting assessment. Within the limitations of the examination, no discrete duodenal ulcer or frank perforation was confidently identified on today's examination. IMPRESSION: 1. Limited single contrast upper GI demonstrating no definitive duodenal ulcer or signs of frank perforation. Given the limitations of today's examination and the findings on the CT study, further evaluation with endoscopy should be considered if clinically appropriate. 2. Achalasia. Electronically Signed   By:  Vinnie Langton M.D.   On: 02/26/2021 16:22   CT Angio Chest/Abd/Pel for Dissection W and/or W/WO  Result Date: 02/26/2021 CLINICAL DATA:  Abdominal pain with aortic dissection suspected EXAM: CT ANGIOGRAPHY CHEST, ABDOMEN AND PELVIS TECHNIQUE: Non-contrast CT of the chest was initially obtained. Multidetector CT imaging through the chest, abdomen and pelvis was performed using the standard protocol during bolus administration of intravenous contrast. Multiplanar reconstructed images and MIPs were obtained and reviewed to evaluate the vascular anatomy. CONTRAST:  141mL OMNIPAQUE IOHEXOL 350 MG/ML SOLN COMPARISON:  Abdominal CT 07/30/2018 FINDINGS: CTA CHEST FINDINGS Cardiovascular: Preferential opacification of the thoracic aorta. No evidence of thoracic aortic aneurysm or dissection. Normal heart size. No pericardial effusion. Aortic and coronary atherosclerosis. Mediastinum/Nodes: History of achalasia with dilated esophagus and superimposed sutures inferiorly. There is internal fluid/debris which was also seen previously. No superimposed inflammation. Lungs/Pleura: Central airways are clear. Centrilobular and panlobular emphysema at the apices. Mild scarring at the bases. There is no edema, consolidation, effusion, or pneumothorax. Musculoskeletal: No acute finding. Review of the MIP images confirms the above findings. CTA ABDOMEN AND PELVIS FINDINGS VASCULAR Aorta: Atheromatous plaque which is diffuse. No dissection or aneurysm Celiac: Unremarkable SMA: Unremarkable Renals: No pathologic finding. Accessory left lower pole renal artery. IMA: Patent Inflow: Atheromatous plaque without stenosis or dissection. Negative for aneurysm Veins: Unremarkable in the arterial phase Review of the MIP images confirms the above findings. NON-VASCULAR Hepatobiliary: No focal liver abnormality.No evidence of biliary obstruction or stone. Pancreas: Unremarkable. Spleen: Unremarkable. Adrenals/Urinary Tract: Negative adrenals.  No hydronephrosis or stone. Unremarkable bladder. Stomach/Bowel: Gas filled outpouching superiorly at the duodenal bulb with regional fat haziness. No pneumoperitoneum, obstruction, or small bowel/colonic wall thickening. No appendicitis. Lymphatic: No mass or adenopathy. Reproductive:No pathologic findings. Other: No ascites or pneumoperitoneum. Musculoskeletal: No acute abnormalities. Advanced lumbar spine degeneration with sclerosis. Review of the MIP images confirms the above  findings. IMPRESSION: 1. Suspect peptic ulcer disease at the duodenal bulb. A superior gas filled outpouching does not have a perceptible wall, but no generalized pneumoperitoneum to suggest free perforation. 2. Negative for acute aortic syndrome. 3. Dilated esophagus with fluid correlating with history of achalasia. 4. Aortic Atherosclerosis (ICD10-I70.0) and Emphysema (ICD10-J43.9). Electronically Signed   By: Monte Fantasia M.D.   On: 02/26/2021 09:47   US Abdomen Limited RUQ (LIVER/GB)  Result Date: 02/26/2021 CLINICAL DATA:  Right upper quadrant pain. EXAM: ULTRASOUND ABDOMEN LIMITED RIGHT UPPER QUADRANT COMPARISON:  None. FINDINGS: Gallbladder: A 1.1 cm nonshadowing echogenic focus is seen within the gallbladder lumen. No gallstones or wall thickening visualized (1.58 mm). No sonographic Murphy sign noted by sonographer. Common bile duct: Diameter: 3.22 mm Liver: No focal lesion identified. Within normal limits in parenchymal echogenicity. Portal vein is patent on color Doppler imaging with normal direction of blood flow towards the liver. Other: None. IMPRESSION: Suspected 1.1 cm gallbladder polyp, without evidence of cholelithiasis or acute cholecystitis. While this may represent a benign polyp, and adenoma versus small neoplasm cannot be excluded. Further evaluation with surgical consultation is recommended. Reference: J Am Coll Radiol 2013;10:953-956. Electronically Signed   By: Virgina Norfolk M.D.   On: 02/26/2021 02:29       Impression / Plan:   1) Upper abdominal pain 2) Duodenal inflammation on CT - EGD to evaluate for PUD, GOO, gastritis/duodenitis - Continue Protonix - N.p.o. for now.  Hoping to get procedure done today, but more likely tomorrow based on endoscopy schedule  3) Achalasia - Evaluate for LES pressure/resistance at time of endoscopy as above.  Patient does not want repeat Botox as this had minimal improvement in the past.  I explained multiple times that we would not be performing pneumatic balloon dilation at the time of the above endoscopy, as this is only performed by Dr. Silverio Decamp. - Encouraged him to follow-up in the GI clinic for continued treatment of his achalasia, to include either repeat pneumatic balloon dilation or potential referral for POEM (not done locally)  4) History of COPD 5) History of asthma - Elevated periprocedural risks due to history of achalasia, COPD, and asthma  The indications, risks, and benefits of EGD were explained to the patient in detail. Risks include but are not limited to bleeding, perforation, adverse reaction to medications, and cardiopulmonary compromise. Sequelae include but are not limited to the possibility of surgery, hospitalization, and mortality. The patient verbalized understanding and wished to proceed. All questions answered.     LOS: 0 days   Lavena Bullion  02/27/2021, 11:01 AM

## 2021-02-27 NOTE — Progress Notes (Signed)
Progress Note    Raymond Jacobs  WNI:[id number] DOB: 1951-05-09  DOA: 02/26/2021 PCP: Patient, No Pcp Per (Inactive)    Brief Narrative:     Medical records reviewed and are as summarized below:  Raymond Jacobs is an 70 y.o. male with medical history significant of achalasia, asthma, COPD, esophageal dysphagia, peptic ulcer disease who presents with ongoing abdominal pain.  Assessment/Plan:   Principal Problem:   PUD (peptic ulcer disease) Active Problems:   Achalasia   Abdominal pain   Asthma with COPD ([place])   Duodenal ulcer   Peptic ulcer disease Achalasia > Patient has known history of duodenal peptic ulcerative disease and achalasia with history of esophageal dysphagia. > Presenting with several months of intermittent left and right upper quadrant abdominal pain worse for the past 1 to 2 days. > CT a chest and pelvis did demonstrate peptic ulcer as detailed above.  This was not noted on limited upper GI series. > GI was consulted and has recommended EGD to be done inpatient - N.p.o. at midnight- suspect will be done 5/8 - PPI  Asthma/COPD - Continue home as needed albuterol  Hyponatremia > Sodium noted to be 128 in the ED this is chronic and stable for him. - trending up   Family Communication/Anticipated D/C date and plan/Code Status    Code Status: Full Code.  Disposition Plan: Status is: Observation  The patient will require care spanning > 2 midnights and should be moved to inpatient because: Ongoing diagnostic testing needed not appropriate for outpatient work up  Dispo: The patient is from: Home              Anticipated d/c is to: Home              Patient currently is not medically stable to d/c.   Difficult to place patient No         Medical Consultants:    GI     Subjective:   No current complaints-- was told by the ER he was going home today  Objective:    Vitals:   02/26/21 1800 02/26/21 2151 02/27/21 0024 02/27/21 0455   BP: 124/81 124/69 121/82 102/73  Pulse: 74 71 70 79  Resp: 15 16 18 16   Temp:  97.8 F (36.6 C) 97.8 F (36.6 C) 98 F (36.7 C)  TempSrc:  Oral Oral Oral  SpO2: 94% 96% 96% 95%  Weight:  52.6 kg  53.1 kg  Height:  5\' 4"  (1.626 m)      Intake/Output Summary (Last 24 hours) at 02/27/2021 1215 Last data filed at 02/27/2021 0600 Gross per 24 hour  Intake 480 ml  Output 1100 ml  Net -620 ml   Filed Weights   02/26/21 0058 02/26/21 2151 02/27/21 0455  Weight: 54.4 kg 52.6 kg 53.1 kg    Exam:  General: Appearance:    small male in no acute distress     Lungs:     respirations unlabored  Heart:    Normal heart rate. Normal rhythm. No murmurs, rubs, or gallops.   MS:   All extremities are intact.   Neurologic:   Awake, alert, oriented x 3. No apparent focal neurological           defect.     Data Reviewed:   I have personally reviewed following labs and imaging studies:  Labs: Labs show the following:   Basic Metabolic Panel: Recent Labs  Lab 02/26/21 0118 02/27/21 0010  NA 128*  133*  K 3.9 3.7  CL 94* 99  CO2 23 26  GLUCOSE 116* 102*  BUN 13 11  CREATININE 0.71 0.79  CALCIUM 9.8 9.0   GFR Estimated Creatinine Clearance: 65.5 mL/min (by C-G formula based on SCr of 0.79 mg/dL). Liver Function Tests: Recent Labs  Lab 02/26/21 0118  AST 18  ALT 15  ALKPHOS 65  BILITOT 0.8  PROT 6.9  ALBUMIN 3.9   Recent Labs  Lab 02/26/21 0118  LIPASE 36   No results for input(s): AMMONIA in the last 168 hours. Coagulation profile No results for input(s): INR, PROTIME in the last 168 hours.  CBC: Recent Labs  Lab 02/26/21 0118 02/27/21 0010  WBC 10.2 7.5  NEUTROABS 6.6  --   HGB 13.5 13.4  HCT 40.2 39.3  MCV 85.2 85.6  PLT 439* 400   Cardiac Enzymes: No results for input(s): CKTOTAL, CKMB, CKMBINDEX, TROPONINI in the last 168 hours. BNP (last 3 results) No results for input(s): PROBNP in the last 8760 hours. CBG: No results for input(s): GLUCAP in the  last 168 hours. D-Dimer: No results for input(s): DDIMER in the last 72 hours. Hgb A1c: No results for input(s): HGBA1C in the last 72 hours. Lipid Profile: No results for input(s): CHOL, HDL, LDLCALC, TRIG, CHOLHDL, LDLDIRECT in the last 72 hours. Thyroid function studies: No results for input(s): TSH, T4TOTAL, T3FREE, THYROIDAB in the last 72 hours.  Invalid input(s): FREET3 Anemia work up: No results for input(s): VITAMINB12, FOLATE, FERRITIN, TIBC, IRON, RETICCTPCT in the last 72 hours. Sepsis Labs: Recent Labs  Lab 02/26/21 0118 02/27/21 0010  WBC 10.2 7.5    Microbiology Recent Results (from the past 240 hour(s))  Resp Panel by RT-PCR (Flu A&B, Covid) Nasopharyngeal Swab     Status: None   Collection Time: 02/26/21  7:16 PM   Specimen: Nasopharyngeal Swab; Nasopharyngeal(NP) swabs in vial transport medium  Result Value Ref Range Status   SARS Coronavirus 2 by RT PCR NEGATIVE NEGATIVE Final    Comment: (NOTE) SARS-CoV-2 target nucleic acids are NOT DETECTED.  The SARS-CoV-2 RNA is generally detectable in upper respiratory specimens during the acute phase of infection. The lowest concentration of SARS-CoV-2 viral copies this assay can detect is 138 copies/mL. A negative result does not preclude SARS-Cov-2 infection and should not be used as the sole basis for treatment or other patient management decisions. A negative result may occur with  improper specimen collection/handling, submission of specimen other than nasopharyngeal swab, presence of viral mutation(s) within the areas targeted by this assay, and inadequate number of viral copies(<138 copies/mL). A negative result must be combined with clinical observations, patient history, and epidemiological information. The expected result is Negative.  Fact Sheet for Patients:  EntrepreneurPulse.com.au  Fact Sheet for Healthcare Providers:  IncredibleEmployment.be  This test is no  t yet approved or cleared by the [place] FDA and  has been authorized for detection and/or diagnosis of SARS-CoV-2 by FDA under an Emergency Use Authorization (EUA). This EUA will remain  in effect (meaning this test can be used) for the duration of the COVID-19 declaration under Section 564(b)(1) of the Act, 21 U.S.C.section 360bbb-3(b)(1), unless the authorization is terminated  or revoked sooner.       Influenza A by PCR NEGATIVE NEGATIVE Final   Influenza B by PCR NEGATIVE NEGATIVE Final    Comment: (NOTE) The Xpert Xpress SARS-CoV-2/FLU/RSV plus assay is intended as an aid in the diagnosis of influenza from Nasopharyngeal swab specimens and  should not be used as a sole basis for treatment. Nasal washings and aspirates are unacceptable for Xpert Xpress SARS-CoV-2/FLU/RSV testing.  Fact Sheet for Patients: BloggerCourse.com  Fact Sheet for Healthcare Providers: SeriousBroker.it  This test is not yet approved or cleared by the [place] FDA and has been authorized for detection and/or diagnosis of SARS-CoV-2 by FDA under an Emergency Use Authorization (EUA). This EUA will remain in effect (meaning this test can be used) for the duration of the COVID-19 declaration under Section 564(b)(1) of the Act, 21 U.S.C. section 360bbb-3(b)(1), unless the authorization is terminated or revoked.  Performed at [facility], [address], [place], [place] [address]     Procedures and diagnostic studies:  DG Chest 2 View  Result Date: 02/26/2021 CLINICAL DATA:  Intermittent lower rib pain and back pain x1 month. EXAM: CHEST - 2 VIEW COMPARISON:  September 12, 2020 FINDINGS: The lungs are hyperinflated. Mild areas of scarring and/or atelectasis are seen within the bilateral lung bases. There is no evidence of a pleural effusion or pneumothorax. The cardiac silhouette is mildly enlarged. Mild calcification of the aortic arch  is noted. Dilatation of the distal esophagus is seen overlying the medial aspect of the right lung base. The visualized skeletal structures are unremarkable. IMPRESSION: Stable exam without active cardiopulmonary disease. Electronically Signed   By: Aram Candela M.D.   On: 02/26/2021 02:34   DG UGI W SINGLE CM (SOL OR THIN BA)  Result Date: 02/26/2021 CLINICAL DATA:  70 year old male with history of abdominal pain. Possible duodenal ulcer noted on recent CT examination. EXAM: UPPER GI SERIES WITHOUT KUB TECHNIQUE: Routine upper GI series was performed with water-soluble contrast material. FLUOROSCOPY TIME:  Fluoroscopy Time:  4 minutes and 30 seconds Radiation Exposure Index (if provided by the fluoroscopic device): 34.2 mGy COMPARISON:  Esophagram 05/14/2019. FINDINGS: Limited single contrast water-soluble esophagram was performed. Within the limitations of today's examination, the esophagus was diffusely dilated and aperistaltic, compatible with patient's known history of achalasia. Marked narrowing of the distal esophagus immediately proximal to the level of the gastroesophageal junction resulted in limited opacification of the stomach, which appeared grossly normal in appearance. Very limited contrast was visualized extending from the stomach into the duodenum, limiting assessment. Within the limitations of the examination, no discrete duodenal ulcer or frank perforation was confidently identified on today's examination. IMPRESSION: 1. Limited single contrast upper GI demonstrating no definitive duodenal ulcer or signs of frank perforation. Given the limitations of today's examination and the findings on the CT study, further evaluation with endoscopy should be considered if clinically appropriate. 2. Achalasia. Electronically Signed   By: Trudie Reed M.D.   On: 02/26/2021 16:22   CT Angio Chest/Abd/Pel for Dissection W and/or W/WO  Result Date: 02/26/2021 CLINICAL DATA:  Abdominal pain with  aortic dissection suspected EXAM: CT ANGIOGRAPHY CHEST, ABDOMEN AND PELVIS TECHNIQUE: Non-contrast CT of the chest was initially obtained. Multidetector CT imaging through the chest, abdomen and pelvis was performed using the standard protocol during bolus administration of intravenous contrast. Multiplanar reconstructed images and MIPs were obtained and reviewed to evaluate the vascular anatomy. CONTRAST:  OMNIPAQUE IOHEXOL 350 MG/ML SOLN COMPARISON:  Abdominal CT 07/30/2018 FINDINGS: CTA CHEST FINDINGS Cardiovascular: Preferential opacification of the thoracic aorta. No evidence of thoracic aortic aneurysm or dissection. Normal heart size. No pericardial effusion. Aortic and coronary atherosclerosis. Mediastinum/Nodes: History of achalasia with dilated esophagus and superimposed sutures inferiorly. There is internal fluid/debris which was also seen previously. No  superimposed inflammation. Lungs/Pleura: Central airways are clear. Centrilobular and panlobular emphysema at the apices. Mild scarring at the bases. There is no edema, consolidation, effusion, or pneumothorax. Musculoskeletal: No acute finding. Review of the MIP images confirms the above findings. CTA ABDOMEN AND PELVIS FINDINGS VASCULAR Aorta: Atheromatous plaque which is diffuse. No dissection or aneurysm Celiac: Unremarkable SMA: Unremarkable Renals: No pathologic finding. Accessory left lower pole renal artery. IMA: Patent Inflow: Atheromatous plaque without stenosis or dissection. Negative for aneurysm Veins: Unremarkable in the arterial phase Review of the MIP images confirms the above findings. NON-VASCULAR Hepatobiliary: No focal liver abnormality.No evidence of biliary obstruction or stone. Pancreas: Unremarkable. Spleen: Unremarkable. Adrenals/Urinary Tract: Negative adrenals. No hydronephrosis or stone. Unremarkable bladder. Stomach/Bowel: Gas filled outpouching superiorly at the duodenal bulb with regional fat haziness. No  pneumoperitoneum, obstruction, or small bowel/colonic wall thickening. No appendicitis. Lymphatic: No mass or adenopathy. Reproductive:No pathologic findings. Other: No ascites or pneumoperitoneum. Musculoskeletal: No acute abnormalities. Advanced lumbar spine degeneration with sclerosis. Review of the MIP images confirms the above findings. IMPRESSION: 1. Suspect peptic ulcer disease at the duodenal bulb. A superior gas filled outpouching does not have a perceptible wall, but no generalized pneumoperitoneum to suggest free perforation. 2. Negative for acute aortic syndrome. 3. Dilated esophagus with fluid correlating with history of achalasia. 4. Aortic Atherosclerosis (ICD10-I70.0) and Emphysema (ICD10-J43.9). Electronically Signed   By: Monte Fantasia M.D.   On: 02/26/2021 09:47   US Abdomen Limited RUQ (LIVER/GB)  Result Date: 02/26/2021 CLINICAL DATA:  Right upper quadrant pain. EXAM: ULTRASOUND ABDOMEN LIMITED RIGHT UPPER QUADRANT COMPARISON:  None. FINDINGS: Gallbladder: A 1.1 cm nonshadowing echogenic focus is seen within the gallbladder lumen. No gallstones or wall thickening visualized (1.58 mm). No sonographic Murphy sign noted by sonographer. Common bile duct: Diameter: 3.22 mm Liver: No focal lesion identified. Within normal limits in parenchymal echogenicity. Portal vein is patent on color Doppler imaging with normal direction of blood flow towards the liver. Other: None. IMPRESSION: Suspected 1.1 cm gallbladder polyp, without evidence of cholelithiasis or acute cholecystitis. While this may represent a benign polyp, and adenoma versus small neoplasm cannot be excluded. Further evaluation with surgical consultation is recommended. Reference: J Am Coll Radiol 2013;10:953-956. Electronically Signed   By: Virgina Norfolk M.D.   On: 02/26/2021 02:29    Medications:   . pantoprazole  40 mg Oral Daily  . sodium chloride flush  3 mL Intravenous Q12H   Continuous Infusions: . sodium chloride 20  mL/hr at 02/27/21 1131  . sodium chloride       LOS: 0 days   Geradine Girt  [facility]   How to contact the [facility] Attending or Consulting provider [place] or covering provider during after hours [place], for this patient?  1. Check the care team in [facility] and look for a) attending/consulting [facility] provider listed and b) the [facility] team listed 2. Log into [email or web address] and use [place]'s universal password to access. If you do not have the password, please contact the hospital operator. 3. Locate the [facility] provider you are looking for under [facility] and page to a number that you can be directly reached. 4. If you still have difficulty reaching the provider, please page the [facility] (Director on Call) for the Hospitalists listed on amion for assistance.  02/27/2021, 12:15 PM

## 2021-02-27 NOTE — Consult Note (Signed)
Consultation  Referring Provider:     Neva Seat, MD Primary Care Physician:  Patient, No Pcp Per (Inactive) Primary Gastroenterologist:      Dr. Silverio Decamp Reason for Consultation:     Upper abdominal pain, abnormal imaging         HPI:   Raymond Jacobs is a 70 y.o. male with a history of achalasia s/p Botox and 2019 and 30 mm pneumatic dilation 04/2019, along with history of asthma, COPD, PUD, presents with upper abdominal pain.  States he has been having intermittent upper abdominal pain for couple of months, but increased pain for the last 1-2 days.  Has been taking Pepcid as prescribed.  Does take NSAIDs.  Pain was nonradiating.  No melena, hematochezia, fever, chills.  No nausea/vomiting.  Maintains very active lifestyle.  Endoscopic history notable for the following: - EGD (07/2012): Moderate to severe esophageal dilation with fluid retention, moderate LES resistance, moderate gastritis - EGD (07/2018): Tortuous esophagus with retained food, no esophageal motility noted, tight LES injected with Botox - EGD (04/2019): Food in esophagus, tight GE junction dilated with 30 mm pneumatic balloon.  Tortuous lower esophagus.  Normal stomach and duodenum  Patient states had minimal improvement with prior Botox injection, but improvement in his achalasia symptoms with prior pneumatic dilation.  Has started having intermittent dysphagia symptoms again for the last couple of months, but has not sought follow-up in the GI clinic since 2020.  He is considering POEM at some point.  Admission evaluation notable for the following: - NA 133 otherwise normal BMP - Normal CBC - Normal/negative troponins, lipase - Normal liver enzymes - Started on Protonix 40 mg/day - CT with inflammation about the duodenal bulb suspicious for PUD.  Dilated esophagus consistent with known achalasia - UGI series without perforation.  Study was limited due to limited opacification of the stomach/small intestine due to  achalasia  General Surgery service consulted with recommendation for UGI series (results above) and GI consult.  No evidence of perforation or need for surgical intervention.  Past Medical History:  Diagnosis Date  . Achalasia   . Aortic atherosclerosis ([place]) 04/08/2018   CT scan  . Duodenal ulcer    duodenal bulb  . Hiatal hernia   . Hyperplastic colon polyp   . Pleural effusion   . Stomach ulcer     Past Surgical History:  Procedure Laterality Date  . BALLOON DILATION N/A 05/14/2019   Procedure: BALLOON DILATION;  Surgeon: Mauri Pole, MD;  Location: [facility];  Service: Endoscopy;  Laterality: N/A;  Rigiflex balloon dilation  . BIOPSY  08/21/2018   Procedure: BIOPSY;  Surgeon: Jerene Bears, MD;  Location: [facility];  Service: Gastroenterology;;  . Lum Keas INJECTION N/A 08/21/2018   Procedure: BOTOX INJECTION;  Surgeon: Jerene Bears, MD;  Location: [facility];  Service: Gastroenterology;  Laterality: N/A;  . ESOPHAGOGASTRODUODENOSCOPY (EGD) WITH PROPOFOL N/A 08/21/2018   Procedure: ESOPHAGOGASTRODUODENOSCOPY (EGD) WITH PROPOFOL;  Surgeon: Jerene Bears, MD;  Location: [facility];  Service: Gastroenterology;  Laterality: N/A;  . ESOPHAGOGASTRODUODENOSCOPY (EGD) WITH PROPOFOL N/A 05/14/2019   Procedure: ESOPHAGOGASTRODUODENOSCOPY (EGD) WITH PROPOFOL;  Surgeon: Mauri Pole, MD;  Location: [facility];  Service: Endoscopy;  Laterality: N/A;  pneumatic balloon dilation with Gastrografin 2 hours post procedure  . FOREIGN BODY REMOVAL  05/14/2019   Procedure: FOREIGN BODY REMOVAL;  Surgeon: Mauri Pole, MD;  Location: [facility];  Service: Endoscopy;;  . IR RADIOLOGIST EVAL & MGMT  04/24/2018  Family History  Problem Relation Age of Onset  . Colon cancer Brother   . Diabetes Brother   . Diabetes Maternal Aunt      Social History   Tobacco Use  . Smoking status: Former Smoker    Packs/day: 1.25    Years: 44.00    Pack years: 55.00    Types:  Cigarettes    Start date: 54    Quit date: 03/01/2020    Years since quitting: 0.9  . Smokeless tobacco: Never Used  Vaping Use  . Vaping Use: Never used  Substance Use Topics  . Alcohol use: Yes    Comment: 1 beer and 3 shots vodka daily  . Drug use: Yes    Types: Marijuana    Prior to Admission medications   Medication Sig Start Date End Date Taking? Authorizing Provider  albuterol (VENTOLIN HFA) 108 (90 Base) MCG/ACT inhaler Inhale 2 puffs into the lungs every 4 (four) hours as needed for wheezing or shortness of breath. 07/16/20 09/12/20 Yes Martyn Ehrich, NP  Ascorbic Acid (VITAMIN C) 500 MG CAPS Take 500 mg by mouth 3 (three) times a week.   Yes [provider]  B Complex-C-Folic Acid (SUPER B-COMPLEX/VIT C/FA) TABS Take 1 tablet by mouth 4 (four) times a week.   Yes [provider]  Cholecalciferol (VITAMIN D3) 125 MCG (5000 UT) CAPS Take 5,000 Units by mouth See admin instructions. Take 5,000 units by mouth five times weekly   Yes [provider]  Cyanocobalamin (VITAMIN B12 PO) Take 1 tablet by mouth See admin instructions. Take 1 tablet by mouth five times a week   Yes [provider]  ECHINACEA PO Take 1 tablet by mouth in the morning.   Yes [provider]  famotidine (PEPCID) 20 MG tablet Take 1 tablet (20 mg total) by mouth 2 (two) times daily. 07/17/18  Yes Pyrtle, Lajuan Lines, MD  ferrous sulfate 325 (65 FE) MG tablet Take 325 mg by mouth 3 (three) times a week.    Yes [provider]  Melatonin 5 MG CAPS Take 1 capsule (5 mg total) by mouth at bedtime as needed. Patient taking differently: Take 5 mg by mouth at bedtime as needed (for sleep). 11/29/19  Yes Martyn Ehrich, NP  Naphazoline-Glycerin (CLEAR EYES REDNESS RELIEF OP) Place 1 drop into both eyes 2 (two) times daily.   Yes [provider]  Nutritional Supplements (ENSURE MAX PROTEIN) LIQD Take 1 each by mouth 3 (three) times daily. Please dispense 90  bottles per month Patient taking differently: Take 11 oz by mouth 2 (two) times a day. 06/26/18  Yes Clent Demark, PA-C  Omega-3 Fatty Acids (FISH OIL) 500 MG CAPS Take 1,000 mg by mouth See admin instructions. Take 1,000 mg by mouth five times a week   Yes [provider]  Vitamin A 2400 MCG (8000 UT) CAPS Take 8,000 Units by mouth See admin instructions. Take 8,000 units by mouth five times a week   Yes [provider]  Cholecalciferol (DIALYVITE VITAMIN D 5000) 125 MCG (5000 UT) capsule Take 5,000 Units by mouth 3 (three) times a week.  Patient not taking: Reported on 02/26/2021    [provider]  IBUPROFEN PO Take 200 mg by mouth in the morning and at bedtime.  Patient not taking: Reported on 02/26/2021    [provider]    Current Facility-Administered Medications  Medication Dose Route Frequency Provider Last Rate Last Admin  . 0.9 %  sodium chloride infusion   Intravenous Continuous Mansouraty, Telford Nab., MD      . acetaminophen (TYLENOL) tablet 650 mg  650 mg Oral Q6H PRN Marcelyn Bruins, MD       Or  . acetaminophen (TYLENOL) suppository 650 mg  650 mg Rectal Q6H PRN Marcelyn Bruins, MD      . albuterol (VENTOLIN HFA) 108 (90 Base) MCG/ACT inhaler 2 puff  2 puff Inhalation Q4H PRN Marcelyn Bruins, MD      . HYDROmorphone (DILAUDID) injection 0.5 mg  0.5 mg Intravenous Q4H PRN Marcelyn Bruins, MD   0.5 mg at 02/26/21 2127  . melatonin tablet 5 mg  5 mg Oral QHS PRN Llana Aliment, RPH      . naphazoline-glycerin (CLEAR EYES REDNESS) ophth solution 1-2 drop  1-2 drop Both Eyes Daily PRN Marcelyn Bruins, MD      . pantoprazole (PROTONIX) EC tablet 40 mg  40 mg Oral Daily Marcelyn Bruins, MD   40 mg at 02/26/21 2127  . sodium chloride flush (NS) 0.9 % injection 3 mL  3 mL Intravenous Q12H Marcelyn Bruins, MD   3 mL at 02/26/21 2233    Allergies as of 02/26/2021 - Review Complete 02/26/2021  Allergen Reaction Noted  .  Asa [aspirin] Other (See Comments) 03/30/2018  . Other Swelling and Other (See Comments) 03/30/2018     Review of Systems:    As per HPI, otherwise negative    Physical Exam:  Vital signs in last 24 hours: Temp:  [97.8 F (36.6 C)-98 F (36.7 C)] 98 F (36.7 C) (05/07 0455) Pulse Rate:  [69-83] 79 (05/07 0455) Resp:  [11-18] 16 (05/07 0455) BP: (102-124)/(69-84) 102/73 (05/07 0455) SpO2:  [92 %-97 %] 95 % (05/07 0455) Weight:  [52.6 kg-53.1 kg] 53.1 kg (05/07 0455)   General:   Pleasant male in NAD Head:  Normocephalic and atraumatic. Eyes:   No icterus.   Conjunctiva pink. Ears:  Normal auditory acuity. Neck:  Supple Lungs:  Respirations even and unlabored. Lungs clear to auscultation bilaterally.   No wheezes, crackles, or rhonchi.  Heart:  Regular rate and rhythm; no MRG Abdomen:  Soft, nondistended, nontender. Normal bowel sounds. No appreciable masses or hepatomegaly.  Rectal:  Not performed.  Msk:  Symmetrical without gross deformities.  Extremities:  Without edema. Neurologic:  Alert and  oriented x4;  grossly normal neurologically. Skin:  Intact without significant lesions or rashes. Psych:  Alert and cooperative. Normal affect.  LAB RESULTS: Recent Labs    02/26/21 0118 02/27/21 0010  WBC 10.2 7.5  HGB 13.5 13.4  HCT 40.2 39.3  PLT 439* 400   BMET Recent Labs    02/26/21 0118 02/27/21 0010  NA 128* 133*  K 3.9 3.7  CL 94* 99  CO2 23 26  GLUCOSE 116* 102*  BUN 13 11  CREATININE 0.71 0.79  CALCIUM 9.8 9.0   LFT Recent Labs    02/26/21 0118  PROT 6.9  ALBUMIN 3.9  AST 18  ALT 15  ALKPHOS 65  BILITOT 0.8   PT/INR No results for input(s): LABPROT, INR in the last 72 hours.  STUDIES: DG Chest 2 View  Result Date: 02/26/2021 CLINICAL DATA:  Intermittent lower rib pain and back pain x1 month. EXAM: CHEST - 2 VIEW COMPARISON:  September 12, 2020 FINDINGS: The lungs are hyperinflated. Mild areas of scarring and/or atelectasis are seen within  the bilateral lung bases. There is no evidence  of a pleural effusion or pneumothorax. The cardiac silhouette is mildly enlarged. Mild calcification of the aortic arch is noted. Dilatation of the distal esophagus is seen overlying the medial aspect of the right lung base. The visualized skeletal structures are unremarkable. IMPRESSION: Stable exam without active cardiopulmonary disease. Electronically Signed   By: Virgina Norfolk M.D.   On: 02/26/2021 02:34   DG UGI W SINGLE CM (SOL OR THIN BA)  Result Date: 02/26/2021 CLINICAL DATA:  70 year old male with history of abdominal pain. Possible duodenal ulcer noted on recent CT examination. EXAM: UPPER GI SERIES WITHOUT KUB TECHNIQUE: Routine upper GI series was performed with water-soluble contrast material. FLUOROSCOPY TIME:  Fluoroscopy Time:  4 minutes and 30 seconds Radiation Exposure Index (if provided by the fluoroscopic device): 34.2 mGy COMPARISON:  Esophagram 05/14/2019. FINDINGS: Limited single contrast water-soluble esophagram was performed. Within the limitations of today's examination, the esophagus was diffusely dilated and aperistaltic, compatible with patient's known history of achalasia. Marked narrowing of the distal esophagus immediately proximal to the level of the gastroesophageal junction resulted in limited opacification of the stomach, which appeared grossly normal in appearance. Very limited contrast was visualized extending from the stomach into the duodenum, limiting assessment. Within the limitations of the examination, no discrete duodenal ulcer or frank perforation was confidently identified on today's examination. IMPRESSION: 1. Limited single contrast upper GI demonstrating no definitive duodenal ulcer or signs of frank perforation. Given the limitations of today's examination and the findings on the CT study, further evaluation with endoscopy should be considered if clinically appropriate. 2. Achalasia. Electronically Signed   By:  Vinnie Langton M.D.   On: 02/26/2021 16:22   CT Angio Chest/Abd/Pel for Dissection W and/or W/WO  Result Date: 02/26/2021 CLINICAL DATA:  Abdominal pain with aortic dissection suspected EXAM: CT ANGIOGRAPHY CHEST, ABDOMEN AND PELVIS TECHNIQUE: Non-contrast CT of the chest was initially obtained. Multidetector CT imaging through the chest, abdomen and pelvis was performed using the standard protocol during bolus administration of intravenous contrast. Multiplanar reconstructed images and MIPs were obtained and reviewed to evaluate the vascular anatomy. CONTRAST:  139mL OMNIPAQUE IOHEXOL 350 MG/ML SOLN COMPARISON:  Abdominal CT 07/30/2018 FINDINGS: CTA CHEST FINDINGS Cardiovascular: Preferential opacification of the thoracic aorta. No evidence of thoracic aortic aneurysm or dissection. Normal heart size. No pericardial effusion. Aortic and coronary atherosclerosis. Mediastinum/Nodes: History of achalasia with dilated esophagus and superimposed sutures inferiorly. There is internal fluid/debris which was also seen previously. No superimposed inflammation. Lungs/Pleura: Central airways are clear. Centrilobular and panlobular emphysema at the apices. Mild scarring at the bases. There is no edema, consolidation, effusion, or pneumothorax. Musculoskeletal: No acute finding. Review of the MIP images confirms the above findings. CTA ABDOMEN AND PELVIS FINDINGS VASCULAR Aorta: Atheromatous plaque which is diffuse. No dissection or aneurysm Celiac: Unremarkable SMA: Unremarkable Renals: No pathologic finding. Accessory left lower pole renal artery. IMA: Patent Inflow: Atheromatous plaque without stenosis or dissection. Negative for aneurysm Veins: Unremarkable in the arterial phase Review of the MIP images confirms the above findings. NON-VASCULAR Hepatobiliary: No focal liver abnormality.No evidence of biliary obstruction or stone. Pancreas: Unremarkable. Spleen: Unremarkable. Adrenals/Urinary Tract: Negative adrenals.  No hydronephrosis or stone. Unremarkable bladder. Stomach/Bowel: Gas filled outpouching superiorly at the duodenal bulb with regional fat haziness. No pneumoperitoneum, obstruction, or small bowel/colonic wall thickening. No appendicitis. Lymphatic: No mass or adenopathy. Reproductive:No pathologic findings. Other: No ascites or pneumoperitoneum. Musculoskeletal: No acute abnormalities. Advanced lumbar spine degeneration with sclerosis. Review of the MIP images confirms the above  findings. IMPRESSION: 1. Suspect peptic ulcer disease at the duodenal bulb. A superior gas filled outpouching does not have a perceptible wall, but no generalized pneumoperitoneum to suggest free perforation. 2. Negative for acute aortic syndrome. 3. Dilated esophagus with fluid correlating with history of achalasia. 4. Aortic Atherosclerosis (ICD10-I70.0) and Emphysema (ICD10-J43.9). Electronically Signed   By: Monte Fantasia M.D.   On: 02/26/2021 09:47   US Abdomen Limited RUQ (LIVER/GB)  Result Date: 02/26/2021 CLINICAL DATA:  Right upper quadrant pain. EXAM: ULTRASOUND ABDOMEN LIMITED RIGHT UPPER QUADRANT COMPARISON:  None. FINDINGS: Gallbladder: A 1.1 cm nonshadowing echogenic focus is seen within the gallbladder lumen. No gallstones or wall thickening visualized (1.58 mm). No sonographic Murphy sign noted by sonographer. Common bile duct: Diameter: 3.22 mm Liver: No focal lesion identified. Within normal limits in parenchymal echogenicity. Portal vein is patent on color Doppler imaging with normal direction of blood flow towards the liver. Other: None. IMPRESSION: Suspected 1.1 cm gallbladder polyp, without evidence of cholelithiasis or acute cholecystitis. While this may represent a benign polyp, and adenoma versus small neoplasm cannot be excluded. Further evaluation with surgical consultation is recommended. Reference: J Am Coll Radiol 2013;10:953-956. Electronically Signed   By: Virgina Norfolk M.D.   On: 02/26/2021 02:29       Impression / Plan:   1) Upper abdominal pain 2) Duodenal inflammation on CT - EGD to evaluate for PUD, GOO, gastritis/duodenitis - Continue Protonix - N.p.o. for now.  Hoping to get procedure done today, but more likely tomorrow based on endoscopy schedule  3) Achalasia - Evaluate for LES pressure/resistance at time of endoscopy as above.  Patient does not want repeat Botox as this had minimal improvement in the past.  I explained multiple times that we would not be performing pneumatic balloon dilation at the time of the above endoscopy, as this is only performed by Dr. Silverio Decamp. - Encouraged him to follow-up in the GI clinic for continued treatment of his achalasia, to include either repeat pneumatic balloon dilation or potential referral for POEM (not done locally)  4) History of COPD 5) History of asthma - Elevated periprocedural risks due to history of achalasia, COPD, and asthma  The indications, risks, and benefits of EGD were explained to the patient in detail. Risks include but are not limited to bleeding, perforation, adverse reaction to medications, and cardiopulmonary compromise. Sequelae include but are not limited to the possibility of surgery, hospitalization, and mortality. The patient verbalized understanding and wished to proceed. All questions answered.     LOS: 0 days   Lavena Bullion  02/27/2021, 11:01 AM

## 2021-02-28 ENCOUNTER — Encounter (HOSPITAL_COMMUNITY)
Admission: EM | Disposition: A | Discharge status: Home / Self Care | Payer: Self-pay | Source: Home / Self Care | Attending: Internal Medicine

## 2021-02-28 ENCOUNTER — Inpatient Hospital Stay (HOSPITAL_COMMUNITY): Payer: Medicare Other | Admitting: Certified Registered"

## 2021-02-28 ENCOUNTER — Encounter (HOSPITAL_COMMUNITY): Payer: Self-pay | Admitting: Internal Medicine

## 2021-02-28 DIAGNOSIS — K2970 Gastritis, unspecified, without bleeding: Secondary | ICD-10-CM

## 2021-02-28 DIAGNOSIS — K2990 Gastroduodenitis, unspecified, without bleeding: Secondary | ICD-10-CM | POA: Diagnosis not present

## 2021-02-28 DIAGNOSIS — K279 Peptic ulcer, site unspecified, unspecified as acute or chronic, without hemorrhage or perforation: Secondary | ICD-10-CM | POA: Diagnosis not present

## 2021-02-28 HISTORY — PX: BIOPSY: SHX5522

## 2021-02-28 HISTORY — PX: ESOPHAGOGASTRODUODENOSCOPY: SHX5428

## 2021-02-28 SURGERY — EGD (ESOPHAGOGASTRODUODENOSCOPY)
Anesthesia: Monitor Anesthesia Care

## 2021-02-28 MED ORDER — SUCRALFATE 1 GM/10ML PO SUSP
1.0000 g | Freq: Three times a day (TID) | ORAL | Status: DC
  Administered 2021-02-28: 1 g via ORAL
  Filled 2021-02-28: qty 10

## 2021-02-28 MED ORDER — PROPOFOL 10 MG/ML IV BOLUS
INTRAVENOUS | Status: DC | PRN
  Administered 2021-02-28: 30 mg via INTRAVENOUS
  Administered 2021-02-28: 20 mg via INTRAVENOUS

## 2021-02-28 MED ORDER — SUCRALFATE 1 GM/10ML PO SUSP: 1.0000 g | Freq: Three times a day (TID) | ORAL | 1 refills | Status: DC

## 2021-02-28 MED ORDER — PROPOFOL 500 MG/50ML IV EMUL
INTRAVENOUS | Status: DC | PRN
  Administered 2021-02-28: 125 ug/kg/min via INTRAVENOUS

## 2021-02-28 MED ORDER — PANTOPRAZOLE SODIUM 40 MG PO TBEC
40.0000 mg | DELAYED_RELEASE_TABLET | Freq: Two times a day (BID) | ORAL | Status: DC
  Administered 2021-02-28: 40 mg via ORAL
  Filled 2021-02-28: qty 1

## 2021-02-28 MED ORDER — PANTOPRAZOLE SODIUM 40 MG PO TBEC: 40.0000 mg | DELAYED_RELEASE_TABLET | Freq: Two times a day (BID) | ORAL | 1 refills | Status: DC

## 2021-02-28 NOTE — Anesthesia Procedure Notes (Signed)
Procedure Name: MAC Date/Time: 02/28/2021 9:10 AM Performed by: Amadeo Garnet, CRNA Pre-anesthesia Checklist: Patient identified, Emergency Drugs available, Suction available and Patient being monitored Patient Re-evaluated:Patient Re-evaluated prior to induction Oxygen Delivery Method: Nasal cannula Preoxygenation: Pre-oxygenation with 100% oxygen Induction Type: IV induction Placement Confirmation: positive ETCO2 Dental Injury: Teeth and Oropharynx as per pre-operative assessment

## 2021-02-28 NOTE — Discharge Instructions (Signed)
Peptic Ulcer  A peptic ulcer is a painful sore in the lining of your stomach or the first part of your small intestine. What are the causes? Common causes of this condition include:  An infection.  Using certain pain medicines too often or too much. What increases the risk? You are more likely to get this condition if you:  Smoke.  Have a family history of ulcer disease.  Drink alcohol.  Have been hospitalized in an intensive care unit (ICU). What are the signs or symptoms? Symptoms include:  Burning pain in the area between the chest and the belly button. The pain may: ? Not go away (be persistent). ? Be worse when your stomach is empty. ? Be worse at night.  Heartburn.  Feeling sick to your stomach (nauseous) and throwing up (vomiting).  Bloating. If the ulcer results in bleeding, it can cause you to:  Have poop (stool) that is black and looks like tar.  Throw up bright red blood.  Throw up material that looks like coffee grounds. How is this treated? Treatment for this condition may include:  Stopping things that can cause the ulcer, such as: ? Smoking. ? Using pain medicines.  Medicines to reduce stomach acid.  Antibiotic medicines if the ulcer is caused by an infection.  A procedure that is done using a small, flexible tube that has a camera at the end (upper endoscopy). This may be done if you have a bleeding ulcer.  Surgery. This may be needed if: ? You have a lot of bleeding. ? The ulcer caused a hole somewhere in the digestive system. Follow these instructions at home:  Do not drink alcohol if your doctor tells you not to drink.  Limit how much caffeine you take in.  Do not use any products that contain nicotine or tobacco, such as cigarettes, e-cigarettes, and chewing tobacco. If you need help quitting, ask your doctor.  Take over-the-counter and prescription medicines only as told by your doctor. ? Do not stop or change your medicines  unless you talk with your doctor about it first. ? Do not take aspirin, ibuprofen, or other NSAIDs unless your doctor told you to do so.  Keep all follow-up visits as told by your doctor. This is important. Contact a doctor if:  You do not get better in 7 days after you start treatment.  You keep having an upset stomach (indigestion) or heartburn. Get help right away if:  You have sudden, sharp pain in your belly (abdomen).  You have belly pain that does not go away.  You have bloody poop (stool) or black, tarry poop.  You throw up blood. It may look like coffee grounds.  You feel light-headed or feel like you may pass out (faint).  You get weak.  You get sweaty or feel sticky and cold to the touch (clammy). Summary  Symptoms of a peptic ulcer include burning pain in the area between the chest and the belly button.  Take medicines only as told by your doctor.  Limit how much alcohol and caffeine you have.  Keep all follow-up visits as told by your doctor. This information is not intended to replace advice given to you by your health care provider. Make sure you discuss any questions you have with your health care provider. Document Revised: 04/17/2018 Document Reviewed: 04/17/2018 Elsevier Patient Education  2021 [place] NSAID use immediately.  This includes ibuprofen (Advil, Motrin), Aleve   Can use good RX coupon  is carafate is expensive to lower the cost

## 2021-02-28 NOTE — Anesthesia Postprocedure Evaluation (Signed)
Anesthesia Post Note  Patient: Rigby Leonhardt  Procedure(s) Performed: ESOPHAGOGASTRODUODENOSCOPY (EGD) (N/A ) BIOPSY     Patient location during evaluation: Endoscopy Anesthesia Type: MAC Level of consciousness: awake and alert Pain management: pain level controlled Vital Signs Assessment: post-procedure vital signs reviewed and stable Respiratory status: spontaneous breathing, nonlabored ventilation, respiratory function stable and patient connected to nasal cannula oxygen Cardiovascular status: stable and blood pressure returned to baseline Postop Assessment: no apparent nausea or vomiting Anesthetic complications: no   No complications documented.  Last Vitals:  Vitals:   02/28/21 0950 02/28/21 0957  BP: 121/71 137/69  Pulse: 70 71  Resp: 16 20  Temp:    SpO2: 98% 94%    Last Pain:  Vitals:   02/28/21 1041  TempSrc:   PainSc: 0-No pain                 Tyrek Lawhorn

## 2021-02-28 NOTE — Interval H&P Note (Signed)
History and Physical Interval Note:  02/28/2021 8:58 AM  Raymond Jacobs  has presented today for surgery, with the diagnosis of Peptic Ulcer Disease and Abnormal CT.  The various methods of treatment have been discussed with the patient and family. After consideration of risks, benefits and other options for treatment, the patient has consented to  Procedure(s): ESOPHAGOGASTRODUODENOSCOPY (EGD) (N/A) as a surgical intervention.  The patient's history has been reviewed, patient examined, no change in status, stable for surgery.  I have reviewed the patient's chart and labs.  Questions were answered to the patient's satisfaction.     Dominic Pea [name]
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
## 2021-02-28 NOTE — Op Note (Signed)
[facility] Patient Name: Raymond Jacobs Procedure Date : 02/28/2021 MRN: [id number] Attending MD: Gerrit Heck , MD Date of Birth: 22-Mar-1951 CSN: [id number] Age: 70 Admit Type: Inpatient Procedure:                Upper GI endoscopy Indications:              Epigastric abdominal pain, Abnormal CT of the GI                            tract                           70 y.o. male with a history of achalasia s/p Botox                            07/2018 and 30 mm pneumatic dilation 04/2019, along                            with history of asthma, COPD, PUD, presents with                            upper abdominal pain. Admission CT with                            inflammation about the duodenal bulb suspicious for                            PUD and dilated esophagus consistent with known                            achalasia. Has been taking Pepcid as prescribed.                            Does take NSAIDs. No melena, hematochezia. Providers:                Gerrit Heck, MD, Kary Kos, RN, William Dalton, Technician, Dulcy Fanny Referring MD:              Medicines:                Monitored Anesthesia Care Complications:            No immediate complications. Estimated Blood Loss:     Estimated blood loss was minimal. Procedure:                Pre-Anesthesia Assessment:                           - Prior to the procedure, a History and Physical                            was performed, and patient medications and  allergies were reviewed. The patient's tolerance of                            previous anesthesia was also reviewed. The risks                            and benefits of the procedure and the sedation                            options and risks were discussed with the patient.                            All questions were answered, and informed consent                            was obtained. Prior  Anticoagulants: The patient has                            taken no previous anticoagulant or antiplatelet                            agents. ASA Grade Assessment: III - A patient with                            severe systemic disease. After reviewing the risks                            and benefits, the patient was deemed in                            satisfactory condition to undergo the procedure.                           After obtaining informed consent, the endoscope was                            passed under direct vision. Throughout the                            procedure, the patient's blood pressure, pulse, and                            oxygen saturations were monitored continuously. The                            [id number]-[id number] ([id number]) Olympus gastroscope was                            introduced through the mouth, and advanced to the                            second part of duodenum. The upper GI endoscopy was  accomplished without difficulty. The patient                            tolerated the procedure well. Scope In: Scope Out: Findings:      Fluid and food debris was found in the entire esophagus. This was       removed with the endoscope with improved views. The lower esophagus was       tortuous.      Features of known Achalasia noted on this study. No appreciable       esophageal motility was noted. In addition, a hypertonic lower       esophageal sphincter was found. There was moderate resistance to       endoscope advancement into the stomach. The Z-line was regular.      Localized minimal inflammation characterized by erythema was found in       the prepyloric region of the stomach. Biopsies were taken with a cold       forceps for Helicobacter pylori testing. Estimated blood loss was       minimal.      The gastric fundus, gastric body and incisura were normal.      Localized severe inflammation characterized by congestion (edema),        erythema, shallow ulcerations, and 1 deep, clean based ulcer was found       in the duodenal bulb. Biopsies were taken with a cold forceps for       histology. Estimated blood loss was minimal.      The second portion of the duodenum was normal. Impression:               - Fluid in the esophagus. This was removed.                           - Achalasia.                           - Minimal pre-pyloric gastritis. Biopsied.                           - Normal gastric fundus, gastric body and incisura.                           - Severe inflammation in the duodenal bulb, with                            erythema, edema, and ulceration, including 1 deep,                            clean based ulcer. Biopsied.                           - Normal second portion of the duodenum. Recommendation:           - Return patient to hospital ward for possible                            discharge same day.                           -  Advance diet as tolerated.                           - Use sucralfate suspension 1 gram PO QID for 6                            weeks.                           - Use Protonix (pantoprazole) 40 mg PO BID for 8                            weeks.                           - Repeat upper endoscopy in 6-8 weeks to check                            healing.                           - Return to GI clinic at appointment to be                            scheduled. Will plan to discuss continued                            management options for Achalasia, to include repeat                            pneumatic balloon dilation vs referral for POEM.                           - Avoid all aspirin, ibuprofen, naproxen, or other                            non-steroidal anti-inflammatory drugs indefinitely. 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 All rights reserved. The codes documented in this report are preliminary and upon coder review may  be revised to meet current compliance requirements. Gerrit Heck, MD 02/28/2021 9:47:50 AM Number of Addenda: 0

## 2021-02-28 NOTE — Anesthesia Preprocedure Evaluation (Signed)
Anesthesia Evaluation  Patient identified by MRN, date of birth, ID band Patient awake    Reviewed: Allergy & Precautions, NPO status , Patient's Chart, lab work & pertinent test results  History of Anesthesia Complications Negative for: history of anesthetic complications  Airway Mallampati: I  TM Distance: >3 FB Neck ROM: Full    Dental  (+) Edentulous Upper, Edentulous Lower   Pulmonary neg shortness of breath, asthma , neg sleep apnea, COPD,  COPD inhaler, neg recent URI, former smoker,  Covid-19 Nucleic Acid Test Results Lab Results      Component                Value               Date                      [place]              NEGATIVE            02/26/2021                [place]              Not Detected        03/09/2020                [place]              NEGATIVE            05/10/2019                [place]                 NEGATIVE            09/12/2020               + decreased breath sounds      Cardiovascular negative cardio ROS   Rhythm:Regular     Neuro/Psych negative neurological ROS  negative psych ROS   GI/Hepatic Neg liver ROS, hiatal hernia, PUD, achalasia   Endo/Other  negative endocrine ROS  Renal/GU negative Renal ROSLab Results      Component                Value               Date                      CREATININE               0.88                02/27/2021                Musculoskeletal negative musculoskeletal ROS (+)   Abdominal   Peds  Hematology negative hematology ROS (+) Lab Results      Component                Value               Date                      WBC                      8.0                 02/27/2021  HGB                      14.0                02/27/2021                HCT                      42.3                02/27/2021                MCV                      86.7                02/27/2021                PLT                      431  (H)             02/27/2021              Anesthesia Other Findings   Reproductive/Obstetrics                             Anesthesia Physical Anesthesia Plan  ASA: II  Anesthesia Plan: MAC   Post-op Pain Management:    Induction: Intravenous  PONV Risk Score and Plan: 1 and Treatment may vary due to age or medical condition and Propofol infusion  Airway Management Planned: Nasal Cannula  Additional Equipment: None  Intra-op Plan:   Post-operative Plan:   Informed Consent: I have reviewed the patients History and Physical, chart, labs and discussed the procedure including the risks, benefits and alternatives for the proposed anesthesia with the patient or authorized representative who has indicated his/her understanding and acceptance.     Dental advisory given  Plan Discussed with: CRNA  Anesthesia Plan Comments:         Anesthesia Quick Evaluation

## 2021-02-28 NOTE — Transfer of Care (Signed)
Immediate Anesthesia Transfer of Care Note  Patient: Angle Karel  Procedure(s) Performed: ESOPHAGOGASTRODUODENOSCOPY (EGD) (N/A ) BIOPSY  Patient Location: Endoscopy Unit  Anesthesia Type:MAC  Level of Consciousness: awake, alert  and oriented  Airway & Oxygen Therapy: Patient Spontanous Breathing and Patient connected to nasal cannula oxygen  Post-op Assessment: Report given to RN, Post -op Vital signs reviewed and stable and Patient moving all extremities  Post vital signs: Reviewed and stable  Last Vitals:  Vitals Value Taken Time  BP 98/52 02/28/21 0939  Temp    Pulse 72 02/28/21 0940  Resp 19 02/28/21 0940  SpO2 100 % 02/28/21 0940  Vitals shown include unvalidated device data.  Last Pain:  Vitals:   02/28/21 0834  TempSrc: Temporal  PainSc: 0-No pain         Complications: No complications documented.

## 2021-02-28 NOTE — Progress Notes (Signed)
RN gave patient DC instructions and the patient stated understanding, new medication escribed to home pharmacy. IV has been removed and tele D/C'd

## 2021-02-28 NOTE — Progress Notes (Signed)
Patient has been NPO past MN

## 2021-03-01 ENCOUNTER — Encounter: Payer: Medicare Other | Admitting: Acute Care

## 2021-03-01 ENCOUNTER — Ambulatory Visit
Admission: RE | Admit: 2021-03-01 | Discharge: 2021-03-01 | Disposition: A | Discharge status: Home / Self Care | Payer: Medicare Other | Source: Ambulatory Visit | Attending: Acute Care | Admitting: Acute Care

## 2021-03-01 ENCOUNTER — Telehealth: Payer: Self-pay

## 2021-03-01 ENCOUNTER — Other Ambulatory Visit: Payer: Self-pay

## 2021-03-01 ENCOUNTER — Encounter (HOSPITAL_COMMUNITY): Payer: Self-pay | Admitting: Gastroenterology

## 2021-03-01 ENCOUNTER — Other Ambulatory Visit: Payer: Self-pay | Admitting: Acute Care

## 2021-03-01 DIAGNOSIS — Z87891 Personal history of nicotine dependence: Secondary | ICD-10-CM

## 2021-03-01 NOTE — Telephone Encounter (Signed)
-----   Message from Mauri Pole, MD sent at 03/01/2021 12:48 PM EDT ----- Thanks Luanna Salk, he was lost to follow up . He no showed for few appt. Beth, please schedule him for office visit, next available appointment.  Thanks VN ----- Message ----- From: Lavena Bullion, DO Sent: 02/28/2021   9:50 AM EDT To: Mauri Pole, MD  Patient was admitted over the weekend with epigastric pain.  EGD with severe duodenitis in duodenal bulb with ulcer but no bleeding.  Otherwise hemodynamically stable.  Still with tight LES 2/2 known achalasia.  Last had pneumatic dilation in 2020 and has not come back for follow-up.  Can plan for follow-up in the office with repeat EGD in 6-8 weeks to evaluate for mucosal healing (likely needs to be done at [facility]) and I also told him there could be discussion re: repeat pneumatic balloon dilation vs referral for POEM (he reports his brother just recently had POEM done in [place], so he is interested in talking more about that).  Likely discharging today.  Thanks

## 2021-03-01 NOTE — Telephone Encounter (Signed)
Pt scheduled to see Dr. Silverio Decamp 05/14/21 at 8:50am. Appt letter mailed to pt.

## 2021-03-02 LAB — SURGICAL PATHOLOGY

## 2021-03-02 NOTE — Telephone Encounter (Signed)
Spoke with pt and rescheduled Shared decision visit for 03/24/21 2:00 CT will be rescheduled Nothing further needed

## 2021-03-03 ENCOUNTER — Encounter: Payer: Self-pay | Admitting: Gastroenterology

## 2021-03-24 ENCOUNTER — Ambulatory Visit (INDEPENDENT_AMBULATORY_CARE_PROVIDER_SITE_OTHER): Payer: Medicare Other | Admitting: Acute Care

## 2021-03-24 ENCOUNTER — Encounter: Payer: Self-pay | Admitting: Acute Care

## 2021-03-24 ENCOUNTER — Other Ambulatory Visit: Payer: Self-pay

## 2021-03-24 ENCOUNTER — Ambulatory Visit
Admission: RE | Admit: 2021-03-24 | Discharge: 2021-03-24 | Disposition: A | Discharge status: Home / Self Care | Payer: Medicare Other | Source: Ambulatory Visit | Attending: Acute Care | Admitting: Acute Care

## 2021-03-24 VITALS — BP 122/70 | HR 79 | Temp 98.2°F | Ht 64.5 in | Wt 120.0 lb

## 2021-03-24 DIAGNOSIS — Z87891 Personal history of nicotine dependence: Secondary | ICD-10-CM

## 2021-03-24 NOTE — Progress Notes (Signed)
Shared Decision Making Visit Lung Cancer Screening Program [phone])   Eligibility:  Age 70 y.o.  Pack Years Smoking History Calculation 55 pack year smoking history (# packs/per year x # years smoked)  Recent History of coughing up blood  no  Unexplained weight loss? no ( >Than 15 pounds within the last 6 months )  Prior History Lung / other cancer no (Diagnosis within the last 5 years already requiring surveillance chest CT Scans).  Smoking Status Former Smoker  Former Smokers: Years since quit: 1 year  Quit Date: 08/2020  Visit Components:  Discussion included one or more decision making aids. yes  Discussion included risk/benefits of screening. yes  Discussion included potential follow up diagnostic testing for abnormal scans. yes  Discussion included meaning and risk of over diagnosis. yes  Discussion included meaning and risk of False Positives. yes  Discussion included meaning of total radiation exposure. yes  Counseling Included:  Importance of adherence to annual lung cancer LDCT screening. yes  Impact of comorbidities on ability to participate in the program. yes  Ability and willingness to under diagnostic treatment. yes  Smoking Cessation Counseling:  Current Smokers:   Discussed importance of smoking cessation. yes  Information about tobacco cessation classes and interventions provided to patient. yes  Patient provided with "ticket" for LDCT Scan. yes  Symptomatic Patient. no  Counseling NA  Diagnosis Code: Tobacco Use Z72.0  Asymptomatic Patient yes  Counseling (Intermediate counseling: > three minutes counseling) V7616  Former Smokers:   Discussed the importance of maintaining cigarette abstinence. yes  Diagnosis Code: Personal History of Nicotine Dependence. W73.710  Information about tobacco cessation classes and interventions provided to patient. Yes  Patient provided with "ticket" for LDCT Scan. yes  Written Order for Lung  Cancer Screening with LDCT placed in Epic. Yes (CT Chest Lung Cancer Screening Low Dose W/O CM) [id number] Z12.2-Screening of respiratory organs Z87.891-Personal history of nicotine dependence  .I spent 25 minutes of face to face time with Raymond Jacobs discussing the risks and benefits of lung cancer screening. We viewed a power point together that explained in detail the above noted topics. We took the time to pause the power point at intervals to allow for questions to be asked and answered to ensure understanding. We discussed that he had taken the single most powerful action possible to decrease his risk of developing lung cancer when he quit smoking. I counseled him to remain smoke free, and to contact me if he ever had the desire to smoke again so that I can provide resources and tools to help support the effort to remain smoke free. We discussed the time and location of the scan, and that either  Raymond Glassman RN or I will call with the results within  24-48 hours of receiving them. He has my card and contact information in the event he needs to speak with me, in addition to a copy of the power point we reviewed as a resource. He verbalized understanding of all of the above and had no further questions upon leaving the office.     I explained to the patient that there has been a high incidence of coronary artery disease noted on these exams. I explained that this is a non-gated exam therefore degree or severity cannot be determined. This patient is not on statin therapy. I have asked the patient to follow-up with their PCP regarding any incidental finding of coronary artery disease and management with diet or medication as they feel is  clinically indicated. The patient verbalized understanding of the above and had no further questions.     Magdalen Spatz, NP 03/24/2021

## 2021-03-24 NOTE — Patient Instructions (Signed)
Thank you for participating in the [place] [facility] Screening Program. It was our pleasure to meet you today. We will call you with the results of your scan within the next few days. Your scan will be assigned a Lung RADS category score by the physicians reading the scans.  This Lung RADS score determines follow up scanning.  See below for description of categories, and follow up screening recommendations. We will be in touch to schedule your follow up screening annually or based on recommendations of our providers. We will fax a copy of your scan results to your Primary Care Physician, or the physician who referred you to the program, to ensure they have the results. Please call the office if you have any questions or concerns regarding your scanning experience or results.  Our office number is [phone]. Please speak with Denise Phelps, RN. She is our Lung Cancer Screening RN. If she is unavailable when you call, please have the office staff send her a message. She will return your call at her earliest convenience. Remember, if your scan is normal, we will scan you annually as long as you continue to meet the criteria for the program. (Age 55-77, Current smoker or smoker who has quit within the last 15 years). If you are a smoker, remember, quitting is the single most powerful action that you can take to decrease your risk of lung cancer and other pulmonary, breathing related problems. We know quitting is hard, and we are here to help.  Please let us know if there is anything we can do to help you meet your goal of quitting. If you are a former smoker, congratulations. We are proud of you! Remain smoke free! Remember you can refer friends or family members through the number above.  We will screen them to make sure they meet criteria for the program. Thank you for helping us take better care of you by participating in Lung Screening.  Lung RADS Categories:  Lung RADS 1: no nodules  or definitely non-concerning nodules.  Recommendation is for a repeat annual scan in 12 months.  Lung RADS 2:  nodules that are non-concerning in appearance and behavior with a very low likelihood of becoming an active cancer. Recommendation is for a repeat annual scan in 12 months.  Lung RADS 3: nodules that are probably non-concerning , includes nodules with a low likelihood of becoming an active cancer.  Recommendation is for a 6-month repeat screening scan. Often noted after an upper respiratory illness. We will be in touch to make sure you have no questions, and to schedule your 6-month scan.  Lung RADS 4 A: nodules with concerning findings, recommendation is most often for a follow up scan in 3 months or additional testing based on our provider's assessment of the scan. We will be in touch to make sure you have no questions and to schedule the recommended 3 month follow up scan.  Lung RADS 4 B:  indicates findings that are concerning. We will be in touch with you to schedule additional diagnostic testing based on our provider's  assessment of the scan.   

## 2021-04-01 NOTE — Progress Notes (Signed)
Please call patient and let them  know their  low dose Ct was read as a Lung RADS 2: nodules that are benign in appearance and behavior with a very low likelihood of becoming a clinically active cancer due to size or lack of growth. Recommendation per radiology is for a repeat LDCT in 12 months. .Please let them  know we will order and schedule their  annual screening scan for 03/2022. Please let them  know there was notation of CAD on their  scan.  Please remind the patient  that this is a non-gated exam therefore degree or severity of disease  cannot be determined. Please have them  follow up with their PCP regarding potential risk factor modification, dietary therapy or pharmacologic therapy if clinically indicated. Pt.  is not currently on statin therapy. Please place order for annual  screening scan for  03/2022 and fax results to PCP. Thanks so much.  Let him know there is notation of 2 vessel atherosclerosis.Make sure he follows up with PCP as he is not on statin therapy. Thanks so much

## 2021-04-06 NOTE — Progress Notes (Signed)
Lm 6/14

## 2021-04-12 ENCOUNTER — Other Ambulatory Visit: Payer: Self-pay | Admitting: *Deleted

## 2021-04-12 DIAGNOSIS — Z87891 Personal history of nicotine dependence: Secondary | ICD-10-CM

## 2021-05-14 ENCOUNTER — Encounter: Payer: Self-pay | Admitting: Gastroenterology

## 2021-05-14 ENCOUNTER — Ambulatory Visit (INDEPENDENT_AMBULATORY_CARE_PROVIDER_SITE_OTHER): Payer: Medicare Other | Admitting: Gastroenterology

## 2021-05-14 VITALS — BP 110/62 | HR 92 | Ht 62.5 in | Wt 118.5 lb

## 2021-05-14 DIAGNOSIS — R1310 Dysphagia, unspecified: Secondary | ICD-10-CM | POA: Diagnosis not present

## 2021-05-14 DIAGNOSIS — K220 Achalasia of cardia: Secondary | ICD-10-CM

## 2021-05-14 DIAGNOSIS — Z7689 Persons encountering health services in other specified circumstances: Secondary | ICD-10-CM

## 2021-05-14 NOTE — Progress Notes (Signed)
Raymond Jacobs    ER:[id number]    1951-08-08  Primary Care Physician:Patient, No Pcp Per (Inactive)  Referring Physician: No referring provider defined for this encounter.   Chief complaint: Achalasia  HPI: 70 year old very pleasant man with history of achalasia here for follow-up visit.  He was lost to follow-up in the past 2 years.   Complains of worsening dysphagia, he feels anything more than soft food gets hung up and he is regurgitating more often. He is able to maintain his weight.  GI history: He was diagnosed with achalasia in 2013.  He underwent EGD October 2013 showed moderate to severe only dilated esophagus, fluid-filled mild inflammatory mucosal changes, biopsies showed chronically inflamed squamous mucosa negative for Barrett's or dysplasia.  He was referred for [facility] myotomy but he never followed through with appointment.   He was hospitalized with perforated duodenal ulcer June 2019.  He was treated non-operatively with percutaneous drainage of fluid collection by IR.   He underwent EGD with Botox injection at EG junction August 21, 2018 by Dr. Hilarie Fredrickson. Reports improvement of swallowing after Botox injection and he is tolerating oral intake well.  Weight is stable. Denies any nausea, vomiting, abdominal pain, melena or bright red blood per rectum   He was born in [place], [place] and has traveled to [place] on few occasions when he was an infant and a toddler. His family immigrated to [place] when he was 43.  He was always small built, his brother is much taller and bigger built than him.  He does not recall having any difficulty with swallowing until recently in the past few years.    Outpatient Encounter Medications as of 05/14/2021  Medication Sig   albuterol (VENTOLIN HFA) 108 (90 Base) MCG/ACT inhaler Inhale 2 puffs into the lungs every 4 (four) hours as needed for wheezing or shortness of breath.   Ascorbic Acid (VITAMIN C) 500 MG CAPS Take  500 mg by mouth 3 (three) times a week.   B Complex-C-Folic Acid (SUPER B-COMPLEX/VIT C/FA) TABS Take 1 tablet by mouth 4 (four) times a week.   Cholecalciferol (VITAMIN D3) 125 MCG (5000 UT) CAPS Take 5,000 Units by mouth See admin instructions. Take 5,000 units by mouth five times weekly   Cyanocobalamin (VITAMIN B12 PO) Take 1 tablet by mouth See admin instructions. Take 1 tablet by mouth five times a week   ECHINACEA PO Take 1 tablet by mouth in the morning.   ferrous sulfate 325 (65 FE) MG tablet Take 325 mg by mouth 3 (three) times a week.    Naphazoline-Glycerin (CLEAR EYES REDNESS RELIEF OP) Place 1 drop into both eyes 2 (two) times daily.   Nutritional Supplements (ENSURE MAX PROTEIN) LIQD Take 1 each by mouth 3 (three) times daily. Please dispense 90 bottles per month (Patient taking differently: Take 11 oz by mouth 2 (two) times a day.)   Omega-3 Fatty Acids (FISH OIL) 500 MG CAPS Take 1,000 mg by mouth See admin instructions. Take 1,000 mg by mouth five times a week   Vitamin A 2400 MCG (8000 UT) CAPS Take 8,000 Units by mouth See admin instructions. Take 8,000 units by mouth five times a week   Melatonin 5 MG CAPS Take 1 capsule (5 mg total) by mouth at bedtime as needed. (Patient not taking: Reported on 05/14/2021)   pantoprazole (PROTONIX) 40 MG tablet Take 1 tablet (40 mg total) by mouth 2 (two) times daily. (Patient  not taking: Reported on 05/14/2021)   [DISCONTINUED] sucralfate (CARAFATE) 1 GM/10ML suspension Take 10 mLs (1 g total) by mouth 4 (four) times daily -  with meals and at bedtime.   No facility-administered encounter medications on file as of 05/14/2021.    Allergies as of 05/14/2021 - Review Complete 05/14/2021  Allergen Reaction Noted   Asa [aspirin] Other (See Comments) 03/30/2018   Other Swelling and Other (See Comments) 03/30/2018    Past Medical History:  Diagnosis Date   Achalasia    Aortic atherosclerosis ([place]) 04/08/2018   CT scan   Duodenal ulcer     duodenal bulb   Hiatal hernia    Hyperplastic colon polyp    Pleural effusion    Stomach ulcer     Past Surgical History:  Procedure Laterality Date   BALLOON DILATION N/A 05/14/2019   Procedure: BALLOON DILATION;  Surgeon: Mauri Pole, MD;  Location: [facility] ENDOSCOPY;  Service: Endoscopy;  Laterality: N/A;  Rigiflex balloon dilation   BIOPSY  08/21/2018   Procedure: BIOPSY;  Surgeon: Jerene Bears, MD;  Location: [facility] ENDOSCOPY;  Service: Gastroenterology;;   BIOPSY  02/28/2021   Procedure: BIOPSY;  Surgeon: Lavena Bullion, DO;  Location: [place] ENDOSCOPY;  Service: Gastroenterology;;   BOTOX INJECTION N/A 08/21/2018   Procedure: BOTOX INJECTION;  Surgeon: Jerene Bears, MD;  Location: [facility] ENDOSCOPY;  Service: Gastroenterology;  Laterality: N/A;   ESOPHAGOGASTRODUODENOSCOPY N/A 02/28/2021   Procedure: ESOPHAGOGASTRODUODENOSCOPY (EGD);  Surgeon: Lavena Bullion, DO;  Location[facility]COPY;  Service: Gastroenterology;  Laterality: N/A;   ESOPHAGOGASTRODUODENOSCOPY (EGD) WITH PROPOFOL N/A 08/21/2018   Procedure: ESOPHAGOGASTRODUODENOSCOPY (EGD) WITH PROPOFOL;  Surgeon: Jerene Bears, MD;  Location: [facility] END[facility];  Service: Gastroenterology;  Laterality: N/A;   ESOPHAGOGASTRODUODENOSCOPY (EGD) WITH PROPOFOL N/A 05/14/2019   Procedure: ESOPHAGOGASTRODUODENOSCOPY (EGD) WITH PROPOFOL;  Surgeon: Mauri Pole, MD;  Location: [facility] ENDOSCOPY;  Service: Endoscopy;  Laterality: N/A;  pneumatic balloon dilation with Gastrografin 2 hours post procedure   FOREIGN BODY REMOVAL  05/14/2019   Procedure: FOREIGN BODY REMOVAL;  Surgeon: Mauri Pole, MD;  Location: [facility] ENDOSCOPY;  Service: Endoscopy;;   IR RADIOLOGIST EVAL & MGMT  04/24/2018    Family History  Problem Relation Age of Onset   Colon cancer Brother    Diabetes Brother    Diabetes Maternal Aunt     Social History   Socioeconomic History   Marital status: Divorced    Spouse name: Not on file   Number of children: Not on file    Years of education: Not on file   Highest education level: Not on file  Occupational History   Not on file  Tobacco Use   Smoking status: Former    Packs/day: 1.25    Years: 44.00    Pack years: 55.00    Types: Cigarettes    Start date: 29    Quit date: 08/2020    Years since quitting: 0.7   Smokeless tobacco: Never  Vaping Use   Vaping Use: Never used  Substance and Sexual Activity   Alcohol use: Yes    Comment: 1 beer and 3 shots vodka daily   Drug use: Yes    Types: Marijuana   Sexual activity: Not on file  Other Topics Concern   Not on file  Social History Narrative   Not on file   Social Determinants of Health   Financial Resource Strain: Not on file  Food Insecurity: Not on file  Transportation Needs: Not on  file  Physical Activity: Not on file  Stress: Not on file  Social Connections: Not on file  Intimate Partner Violence: Not on file      Review of systems: All other review of systems negative except as mentioned in the HPI.   Physical Exam: Vitals:   05/14/21 0844  BP: 110/62  Pulse: 92   Body mass index is 21.33 kg/m. Gen:      No acute distress HEENT:  sclera anicteric Abd:      soft, non-tender; no palpable masses, no distension Ext:    No edema Neuro: alert and oriented x 3 Psych: normal mood and affect  Data Reviewed:  Reviewed labs, radiology imaging, old records and pertinent past GI work up   Assessment and Plan/Recommendations:  70 year old very pleasant gentleman with history of achalasia with complaints of worsening dysphagia and epigastric abdominal pain We will schedule for EGD to evaluate esophageal mucosa, exclude erosive esophagitis or neoplastic lesion He will benefit from [facility] myotomy, based on EGD findings will refer to CT surgeon Dr. Kipp Brood for evaluation Continue anti reflux measures  The risks and benefits as well as alternatives of endoscopic procedure(s) have been discussed and reviewed. All questions  answered. The patient agrees to proceed.  Advised patient to stay n.p.o. after midnight and to avoid eating any solid food after 8 PM the night before the procedure Will need to do the procedure at hospital endoscopy unit with general anesthesia  The patient was provided an opportunity to ask questions and all were answered. The patient agreed with the plan and demonstrated an understanding of the instructions.  Damaris Hippo , MD    CC: No ref. provider found

## 2021-05-14 NOTE — H&P (View-Only) (Signed)
Raymond Jacobs    ER:[id number]    06-17-1951  Primary Care Physician:Patient, No Pcp Per (Inactive)  Referring Physician: No referring provider defined for this encounter.   Chief complaint: Achalasia  HPI: 70 year old very pleasant man with history of achalasia here for follow-up visit.  He was lost to follow-up in the past 2 years.   Complains of worsening dysphagia, he feels anything more than soft food gets hung up and he is regurgitating more often. He is able to maintain his weight.  GI history: He was diagnosed with achalasia in 2013.  He underwent EGD October 2013 showed moderate to severe only dilated esophagus, fluid-filled mild inflammatory mucosal changes, biopsies showed chronically inflamed squamous mucosa negative for Barrett's or dysplasia.  He was referred for [facility] myotomy but he never followed through with appointment.   He was hospitalized with perforated duodenal ulcer June 2019.  He was treated non-operatively with percutaneous drainage of fluid collection by IR.   He underwent EGD with Botox injection at EG junction August 21, 2018 by Dr. Hilarie Fredrickson. Reports improvement of swallowing after Botox injection and he is tolerating oral intake well.  Weight is stable. Denies any nausea, vomiting, abdominal pain, melena or bright red blood per rectum   He was born in [place], [place] and has traveled to [place] on few occasions when he was an infant and a toddler. His family immigrated to [place] when he was 71.  He was always small built, his brother is much taller and bigger built than him.  He does not recall having any difficulty with swallowing until recently in the past few years.    Outpatient Encounter Medications as of 05/14/2021  Medication Sig   albuterol (VENTOLIN HFA) 108 (90 Base) MCG/ACT inhaler Inhale 2 puffs into the lungs every 4 (four) hours as needed for wheezing or shortness of breath.   Ascorbic Acid (VITAMIN C) 500 MG CAPS Take  500 mg by mouth 3 (three) times a week.   B Complex-C-Folic Acid (SUPER B-COMPLEX/VIT C/FA) TABS Take 1 tablet by mouth 4 (four) times a week.   Cholecalciferol (VITAMIN D3) 125 MCG (5000 UT) CAPS Take 5,000 Units by mouth See admin instructions. Take 5,000 units by mouth five times weekly   Cyanocobalamin (VITAMIN B12 PO) Take 1 tablet by mouth See admin instructions. Take 1 tablet by mouth five times a week   ECHINACEA PO Take 1 tablet by mouth in the morning.   ferrous sulfate 325 (65 FE) MG tablet Take 325 mg by mouth 3 (three) times a week.    Naphazoline-Glycerin (CLEAR EYES REDNESS RELIEF OP) Place 1 drop into both eyes 2 (two) times daily.   Nutritional Supplements (ENSURE MAX PROTEIN) LIQD Take 1 each by mouth 3 (three) times daily. Please dispense 90 bottles per month (Patient taking differently: Take 11 oz by mouth 2 (two) times a day.)   Omega-3 Fatty Acids (FISH OIL) 500 MG CAPS Take 1,000 mg by mouth See admin instructions. Take 1,000 mg by mouth five times a week   Vitamin A 2400 MCG (8000 UT) CAPS Take 8,000 Units by mouth See admin instructions. Take 8,000 units by mouth five times a week   Melatonin 5 MG CAPS Take 1 capsule (5 mg total) by mouth at bedtime as needed. (Patient not taking: Reported on 05/14/2021)   pantoprazole (PROTONIX) 40 MG tablet Take 1 tablet (40 mg total) by mouth 2 (two) times daily. (Patient  not taking: Reported on 05/14/2021)   [DISCONTINUED] sucralfate (CARAFATE) 1 GM/10ML suspension Take 10 mLs (1 g total) by mouth 4 (four) times daily -  with meals and at bedtime.   No facility-administered encounter medications on file as of 05/14/2021.    Allergies as of 05/14/2021 - Review Complete 05/14/2021  Allergen Reaction Noted   Asa [aspirin] Other (See Comments) 03/30/2018   Other Swelling and Other (See Comments) 03/30/2018    Past Medical History:  Diagnosis Date   Achalasia    Aortic atherosclerosis ([place]) 04/08/2018   CT scan   Duodenal ulcer     duodenal bulb   Hiatal hernia    Hyperplastic colon polyp    Pleural effusion    Stomach ulcer     Past Surgical History:  Procedure Laterality Date   BALLOON DILATION N/A 05/14/2019   Procedure: BALLOON DILATION;  Surgeon: Mauri Pole, MD;  Location: [facility] ENDOSCOPY;  Service: Endoscopy;  Laterality: N/A;  Rigiflex balloon dilation   BIOPSY  08/21/2018   Procedure: BIOPSY;  Surgeon: Jerene Bears, MD;  Location: [facility] ENDOSCOPY;  Service: Gastroenterology;;   BIOPSY  02/28/2021   Procedure: BIOPSY;  Surgeon: Lavena Bullion, DO;  Location: [facility] ENDOSCOPY;  Service: Gastroenterology;;   BOTOX INJECTION N/A 08/21/2018   Procedure: BOTOX INJECTION;  Surgeon: Jerene Bears, MD;  Location: [facility] ENDOSCOPY;  Service: Gastroenterology;  Laterality: N/A;   ESOPHAGOGASTRODUODENOSCOPY N/A 02/28/2021   Procedure: ESOPHAGOGASTRODUODENOSCOPY (EGD);  Surgeon: Lavena Bullion, DO;  Location[facility] END[facility]COPY;  Service: Gastroenterology;  Laterality: N/A;   ESOPHAGOGASTRODUODENOSCOPY (EGD) WITH PROPOFOL N/A 08/21/2018   Procedure: ESOPHAGOGASTRODUODENOSCOPY (EGD) WITH PROPOFOL;  Surgeon: Jerene Bears, MD;  Location: [facility] ENDOSCOPY;  Service: Gastroenterology;  Laterality: N/A;   ESOPHAGOGASTRODUODENOSCOPY (EGD) WITH PROPOFOL N/A 05/14/2019   Procedure: ESOPHAGOGASTRODUODENOSCOPY (EGD) WITH PROPOFOL;  Surgeon: Mauri Pole, MD;  Location: [facility] ENDOSCOPY;  Service: Endoscopy;  Laterality: N/A;  pneumatic balloon dilation with Gastrografin 2 hours post procedure   FOREIGN BODY REMOVAL  05/14/2019   Procedure: FOREIGN BODY REMOVAL;  Surgeon: Mauri Pole, MD;  Location: [facility] ENDOSCOPY;  Service: Endoscopy;;   IR RADIOLOGIST EVAL & MGMT  04/24/2018    Family History  Problem Relation Age of Onset   Colon cancer Brother    Diabetes Brother    Diabetes Maternal Aunt     Social History   Socioeconomic History   Marital status: Divorced    Spouse name: Not on file   Number of children: Not on file    Years of education: Not on file   Highest education level: Not on file  Occupational History   Not on file  Tobacco Use   Smoking status: Former    Packs/day: 1.25    Years: 44.00    Pack years: 55.00    Types: Cigarettes    Start date: 84    Quit date: 08/2020    Years since quitting: 0.7   Smokeless tobacco: Never  Vaping Use   Vaping Use: Never used  Substance and Sexual Activity   Alcohol use: Yes    Comment: 1 beer and 3 shots vodka daily   Drug use: Yes    Types: Marijuana   Sexual activity: Not on file  Other Topics Concern   Not on file  Social History Narrative   Not on file   Social Determinants of Health   Financial Resource Strain: Not on file  Food Insecurity: Not on file  Transportation Needs: Not on  file  Physical Activity: Not on file  Stress: Not on file  Social Connections: Not on file  Intimate Partner Violence: Not on file      Review of systems: All other review of systems negative except as mentioned in the HPI.   Physical Exam: Vitals:   05/14/21 0844  BP: 110/62  Pulse: 92   Body mass index is 21.33 kg/m. Gen:      No acute distress HEENT:  sclera anicteric Abd:      soft, non-tender; no palpable masses, no distension Ext:    No edema Neuro: alert and oriented x 3 Psych: normal mood and affect  Data Reviewed:  Reviewed labs, radiology imaging, old records and pertinent past GI work up   Assessment and Plan/Recommendations:  70 year old very pleasant gentleman with history of achalasia with complaints of worsening dysphagia and epigastric abdominal pain We will schedule for EGD to evaluate esophageal mucosa, exclude erosive esophagitis or neoplastic lesion He will benefit from [facility] myotomy, based on EGD findings will refer to CT surgeon Dr. Kipp Brood for evaluation Continue anti reflux measures  The risks and benefits as well as alternatives of endoscopic procedure(s) have been discussed and reviewed. All questions  answered. The patient agrees to proceed.  Advised patient to stay n.p.o. after midnight and to avoid eating any solid food after 8 PM the night before the procedure Will need to do the procedure at hospital endoscopy unit with general anesthesia  The patient was provided an opportunity to ask questions and all were answered. The patient agreed with the plan and demonstrated an understanding of the instructions.  Damaris Hippo , MD    CC: No ref. provider found

## 2021-05-14 NOTE — Patient Instructions (Signed)
We have scheduled you for an Endoscopy at [facility] on 05/24/2021 at 7:30am, Please follow your instructions given to you in the office  We will refer you to a Primary Care Doctor and they will contact you for this appointment  We will refer you to Dr Kipp Brood for [facility] and they will contact you with that appointment  Conn's Current Therapy 2021 (pp. [phone]). [place], [place]: Elsevier.">  Gastroesophageal Reflux Disease, Adult Gastroesophageal reflux (GER) happens when acid from the stomach flows up into the tube that connects the mouth and the stomach (esophagus). Normally, food travels down the esophagus and stays in the stomach to be digested. However, when a person has GER, food and stomach acid sometimes move back up into the esophagus. If this becomes a more serious problem, the person may be diagnosed with a disease called gastroesophageal reflux disease (GERD). GERD occurs when the reflux: Happens often. Causes frequent or severe symptoms. Causes problems such as damage to the esophagus. When stomach acid comes in contact with the esophagus, the acid may cause inflammation in the esophagus. Over time, GERD may create small holes (ulcers) in the lining of the esophagus. What are the causes? This condition is caused by a problem with the muscle between the esophagus and the stomach (lower esophageal sphincter, or LES). Normally, the LES muscle closes after food passes through the esophagus to the stomach. When the LES is weakened or abnormal, it does not close properly, and that allows food and stomach acid to go back up into theesophagus. The LES can be weakened by certain dietary substances, medicines, and medical conditions, including: Tobacco use. Pregnancy. Having a hiatal hernia. Alcohol use. Certain foods and beverages, such as coffee, chocolate, onions, and peppermint. What increases the risk? You are more likely to develop this condition if you: Have an  increased body weight. Have a connective tissue disorder. Take NSAIDs, such as ibuprofen. What are the signs or symptoms? Symptoms of this condition include: Heartburn. Difficult or painful swallowing and the feeling of having a lump in the throat. A bitter taste in the mouth. Bad breath and having a large amount of saliva. Having an upset or bloated stomach and belching. Chest pain. Different conditions can cause chest pain. Make sure you see your health care provider if you experience chest pain. Shortness of breath or wheezing. Ongoing (chronic) cough or a nighttime cough. Wearing away of tooth enamel. Weight loss. How is this diagnosed? This condition may be diagnosed based on a medical history and a physical exam. To determine if you have mild or severe GERD, your health care provider may also monitor how you respond to treatment. You may also have tests, including: A test to examine your stomach and esophagus with a small camera (endoscopy). A test that measures the acidity level in your esophagus. A test that measures how much pressure is on your esophagus. A barium swallow or modified barium swallow test to show the shape, size, and functioning of your esophagus. How is this treated? Treatment for this condition may vary depending on how severe your symptoms are. Your health care provider may recommend: Changes to your diet. Medicine. Surgery. The goal of treatment is to help relieve your symptoms and to preventcomplications. Follow these instructions at home: Eating and drinking  Follow a diet as recommended by your health care provider. This may involve avoiding foods and drinks such as: Coffee and tea, with or without caffeine. Drinks that contain alcohol. Energy drinks and sports  drinks. Carbonated drinks or sodas. Chocolate and cocoa. Peppermint and mint flavorings. Garlic and onions. Horseradish. Spicy and acidic foods, including peppers, chili powder, curry  powder, vinegar, hot sauces, and barbecue sauce. Citrus fruit juices and citrus fruits, such as oranges, lemons, and limes. Tomato-based foods, such as red sauce, chili, salsa, and pizza with red sauce. Fried and fatty foods, such as donuts, french fries, potato chips, and high-fat dressings. High-fat meats, such as hot dogs and fatty cuts of red and white meats, such as rib eye steak, sausage, ham, and bacon. High-fat dairy items, such as whole milk, butter, and cream cheese. Eat small, frequent meals instead of large meals. Avoid drinking large amounts of liquid with your meals. Avoid eating meals during the 2-3 hours before bedtime. Avoid lying down right after you eat. Do not exercise right after you eat.  Lifestyle  Do not use any products that contain nicotine or tobacco. These products include cigarettes, chewing tobacco, and vaping devices, such as e-cigarettes. If you need help quitting, ask your health care provider. Try to reduce your stress by using methods such as yoga or meditation. If you need help reducing stress, ask your health care provider. If you are overweight, reduce your weight to an amount that is healthy for you. Ask your health care provider for guidance about a safe weight loss goal.  General instructions Pay attention to any changes in your symptoms. Take over-the-counter and prescription medicines only as told by your health care provider. Do not take aspirin, ibuprofen, or other NSAIDs unless your health care provider told you to take these medicines. Wear loose-fitting clothing. Do not wear anything tight around your waist that causes pressure on your abdomen. Raise (elevate) the head of your bed about 6 inches (15 cm). You can use a wedge to do this. Avoid bending over if this makes your symptoms worse. Keep all follow-up visits. This is important. Contact a health care provider if: You have: New symptoms. Unexplained weight loss. Difficulty swallowing or  it hurts to swallow. Wheezing or a persistent cough. A hoarse voice. Your symptoms do not improve with treatment. Get help right away if: You have sudden pain in your arms, neck, jaw, teeth, or back. You suddenly feel sweaty, dizzy, or light-headed. You have chest pain or shortness of breath. You vomit and the vomit is green, yellow, or black, or it looks like blood or coffee grounds. You faint. You have stool that is red, bloody, or black. You cannot swallow, drink, or eat. These symptoms may represent a serious problem that is an emergency. Do not wait to see if the symptoms will go away. Get medical help right away. Call your local emergency services (911 in the [place] Do not drive yourself to the hospital. Summary Gastroesophageal reflux happens when acid from the stomach flows up into the esophagus. GERD is a disease in which the reflux happens often, causes frequent or severe symptoms, or causes problems such as damage to the esophagus. Treatment for this condition may vary depending on how severe your symptoms are. Your health care provider may recommend diet and lifestyle changes, medicine, or surgery. Contact a health care provider if you have new or worsening symptoms. Take over-the-counter and prescription medicines only as told by your health care provider. Do not take aspirin, ibuprofen, or other NSAIDs unless your health care provider told you to do so. Keep all follow-up visits as told by your health care provider. This is important. This information is not  intended to replace advice given to you by your health care provider. Make sure you discuss any questions you have with your healthcare provider. Document Revised: 04/20/2020 Document Reviewed: 04/20/2020 Elsevier Patient Education  2022 [employer].  Due to recent changes in healthcare laws, you may see the results of your imaging and laboratory studies on MyChart before your provider has had a chance to review them.  We  understand that in some cases there may be results that are confusing or concerning to you. Not all laboratory results come back in the same time frame and the provider may be waiting for multiple results in order to interpret others.  Please give [place] 48 hours in order for your provider to thoroughly review all the results before contacting the office for clarification of your results.    If you are age 86 or older, your body mass index should be between 23-30. Your Body mass index is 21.33 kg/m. If this is out of the aforementioned range listed, please consider follow up with your Primary Care Provider.  If you are age 24 or younger, your body mass index should be between 19-25. Your Body mass index is 21.33 kg/m. If this is out of the aformentioned range listed, please consider follow up with your Primary Care Provider.   __________________________________________________________  The [facility] providers would like to encourage you to use [facility] to communicate with providers for non-urgent requests or questions.  Due to long hold times on the telephone, sending your provider a message by [facility] may be a faster and more efficient way to get a response.  Please allow 48 business hours for a response.  Please remember that this is for non-urgent requests.    I appreciate the  opportunity to care for you  Thank You   Harl Bowie , MD

## 2021-05-18 ENCOUNTER — Encounter: Payer: Self-pay | Admitting: Gastroenterology

## 2021-05-24 ENCOUNTER — Other Ambulatory Visit: Payer: Self-pay

## 2021-05-24 ENCOUNTER — Ambulatory Visit (HOSPITAL_COMMUNITY): Payer: Medicare Other | Admitting: Registered Nurse

## 2021-05-24 ENCOUNTER — Encounter (HOSPITAL_COMMUNITY)
Admission: RE | Disposition: A | Discharge status: Home / Self Care | Payer: Self-pay | Source: Home / Self Care | Attending: Gastroenterology

## 2021-05-24 ENCOUNTER — Encounter (HOSPITAL_COMMUNITY): Payer: Self-pay | Admitting: Gastroenterology

## 2021-05-24 ENCOUNTER — Ambulatory Visit (HOSPITAL_COMMUNITY)
Admission: RE | Admit: 2021-05-24 | Discharge: 2021-05-24 | Disposition: A | Discharge status: Home / Self Care | Payer: Medicare Other | Attending: Gastroenterology | Admitting: Gastroenterology

## 2021-05-24 DIAGNOSIS — Z886 Allergy status to analgesic agent status: Secondary | ICD-10-CM | POA: Diagnosis not present

## 2021-05-24 DIAGNOSIS — K220 Achalasia of cardia: Secondary | ICD-10-CM | POA: Insufficient documentation

## 2021-05-24 DIAGNOSIS — K56699 Other intestinal obstruction unspecified as to partial versus complete obstruction: Secondary | ICD-10-CM

## 2021-05-24 DIAGNOSIS — Z833 Family history of diabetes mellitus: Secondary | ICD-10-CM | POA: Insufficient documentation

## 2021-05-24 DIAGNOSIS — Z800 Family history of malignant neoplasm of digestive organs: Secondary | ICD-10-CM | POA: Diagnosis not present

## 2021-05-24 DIAGNOSIS — X58XXXA Exposure to other specified factors, initial encounter: Secondary | ICD-10-CM | POA: Diagnosis not present

## 2021-05-24 DIAGNOSIS — Z888 Allergy status to other drugs, medicaments and biological substances status: Secondary | ICD-10-CM | POA: Insufficient documentation

## 2021-05-24 DIAGNOSIS — K269 Duodenal ulcer, unspecified as acute or chronic, without hemorrhage or perforation: Secondary | ICD-10-CM

## 2021-05-24 DIAGNOSIS — R1310 Dysphagia, unspecified: Secondary | ICD-10-CM

## 2021-05-24 DIAGNOSIS — Z79899 Other long term (current) drug therapy: Secondary | ICD-10-CM | POA: Diagnosis not present

## 2021-05-24 DIAGNOSIS — T18128A Food in esophagus causing other injury, initial encounter: Secondary | ICD-10-CM | POA: Diagnosis not present

## 2021-05-24 DIAGNOSIS — Z87891 Personal history of nicotine dependence: Secondary | ICD-10-CM | POA: Diagnosis not present

## 2021-05-24 DIAGNOSIS — W44F3XA Food entering into or through a natural orifice, initial encounter: Secondary | ICD-10-CM

## 2021-05-24 DIAGNOSIS — K2980 Duodenitis without bleeding: Secondary | ICD-10-CM | POA: Diagnosis not present

## 2021-05-24 DIAGNOSIS — K222 Esophageal obstruction: Secondary | ICD-10-CM | POA: Insufficient documentation

## 2021-05-24 DIAGNOSIS — K2970 Gastritis, unspecified, without bleeding: Secondary | ICD-10-CM | POA: Insufficient documentation

## 2021-05-24 HISTORY — PX: FOREIGN BODY REMOVAL: SHX962

## 2021-05-24 HISTORY — PX: ESOPHAGOGASTRODUODENOSCOPY: SHX5428

## 2021-05-24 HISTORY — PX: BIOPSY: SHX5522

## 2021-05-24 SURGERY — EGD (ESOPHAGOGASTRODUODENOSCOPY)
Anesthesia: General

## 2021-05-24 MED ORDER — LIDOCAINE 2% (20 MG/ML) 5 ML SYRINGE
INTRAMUSCULAR | Status: DC | PRN
  Administered 2021-05-24: 80 mg via INTRAVENOUS

## 2021-05-24 MED ORDER — PROPOFOL 10 MG/ML IV BOLUS
INTRAVENOUS | Status: DC | PRN
  Administered 2021-05-24: 60 mg via INTRAVENOUS
  Administered 2021-05-24: 20 mg via INTRAVENOUS
  Administered 2021-05-24: 60 mg via INTRAVENOUS
  Administered 2021-05-24: 20 mg via INTRAVENOUS

## 2021-05-24 MED ORDER — SODIUM CHLORIDE 0.9 % IV SOLN: INTRAVENOUS | Status: DC

## 2021-05-24 MED ORDER — SUCCINYLCHOLINE CHLORIDE 200 MG/10ML IV SOSY
PREFILLED_SYRINGE | INTRAVENOUS | Status: DC | PRN
  Administered 2021-05-24: 120 mg via INTRAVENOUS

## 2021-05-24 MED ORDER — PROPOFOL 500 MG/50ML IV EMUL
INTRAVENOUS | Status: DC | PRN
  Administered 2021-05-24: 130 ug/kg/min via INTRAVENOUS

## 2021-05-24 MED ORDER — LANSOPRAZOLE 30 MG PO TBDD: 30.0000 mg | DELAYED_RELEASE_TABLET | Freq: Two times a day (BID) | ORAL | 3 refills | Status: DC

## 2021-05-24 MED ORDER — PROPOFOL 500 MG/50ML IV EMUL
INTRAVENOUS | Status: AC
  Filled 2021-05-24: qty 50

## 2021-05-24 MED ORDER — EPHEDRINE SULFATE-NACL 50-0.9 MG/10ML-% IV SOSY
PREFILLED_SYRINGE | INTRAVENOUS | Status: DC | PRN
  Administered 2021-05-24 (×2): 5 mg via INTRAVENOUS

## 2021-05-24 NOTE — Anesthesia Preprocedure Evaluation (Addendum)
Anesthesia Evaluation  Patient identified by MRN, date of birth, ID band Patient awake    Reviewed: Allergy & Precautions, NPO status , Patient's Chart, lab work & pertinent test results  History of Anesthesia Complications Negative for: history of anesthetic complications  Airway Mallampati: I  TM Distance: >3 FB Neck ROM: Full    Dental  (+) Edentulous Upper, Dental Advisory Given   Pulmonary neg shortness of breath, asthma , neg sleep apnea, COPD,  COPD inhaler, neg recent URI, former smoker,  Covid-19 Nucleic Acid Test Results Lab Results      Component                Value               Date                      [place]              NEGATIVE            02/26/2021                [place]              Not Detected        03/09/2020                [place]              NEGATIVE            05/10/2019                [place]                 NEGATIVE            09/12/2020               + decreased breath sounds      Cardiovascular negative cardio ROS   Rhythm:Regular     Neuro/Psych negative neurological ROS  negative psych ROS   GI/Hepatic Neg liver ROS, hiatal hernia, PUD, achalasia   Endo/Other  negative endocrine ROS  Renal/GU negative Renal ROS     Musculoskeletal negative musculoskeletal ROS (+)   Abdominal   Peds  Hematology negative hematology ROS (+)   Anesthesia Other Findings   Reproductive/Obstetrics                            Anesthesia Physical Anesthesia Plan  ASA: 2  Anesthesia Plan: General   Post-op Pain Management:    Induction: Intravenous  PONV Risk Score and Plan: 2 and Treatment may vary due to age or medical condition, Propofol infusion and Ondansetron  Airway Management Planned: Oral ETT  Additional Equipment: None  Intra-op Plan:   Post-operative Plan: Extubation in OR  Informed Consent: I have reviewed the patients History and  Physical, chart, labs and discussed the procedure including the risks, benefits and alternatives for the proposed anesthesia with the patient or authorized representative who has indicated his/her understanding and acceptance.     Dental advisory given  Plan Discussed with: CRNA and Anesthesiologist  Anesthesia Plan Comments:        Anesthesia Quick Evaluation

## 2021-05-24 NOTE — Transfer of Care (Signed)
Immediate Anesthesia Transfer of Care Note  Patient: Raymond Jacobs  Procedure(s) Performed: ESOPHAGOGASTRODUODENOSCOPY (EGD) Balloon dilation wire-guided BIOPSY FOREIGN BODY REMOVAL  Patient Location: PACU and Endoscopy Unit  Anesthesia Type:General  Level of Consciousness: awake, alert , oriented and patient cooperative  Airway & Oxygen Therapy: Patient Spontanous Breathing and Patient connected to face mask oxygen  Post-op Assessment: Report given to RN, Post -op Vital signs reviewed and stable and Patient moving all extremities  Post vital signs: Reviewed and stable  Last Vitals:  Vitals Value Taken Time  BP 119/73 05/24/21 0920  Temp    Pulse 76 05/24/21 0921  Resp 19 05/24/21 0921  SpO2 100 % 05/24/21 0921  Vitals shown include unvalidated device data.  Last Pain:  Vitals:   05/24/21 0718  TempSrc: Oral  PainSc: 0-No pain         Complications: No notable events documented.

## 2021-05-24 NOTE — Anesthesia Procedure Notes (Signed)
Procedure Name: Intubation Date/Time: 05/24/2021 8:10 AM Performed by: Victoriano Lain, CRNA Pre-anesthesia Checklist: Patient identified, Emergency Drugs available, Suction available, Patient being monitored and Timeout performed Patient Re-evaluated:Patient Re-evaluated prior to induction Oxygen Delivery Method: Circle system utilized Preoxygenation: Pre-oxygenation with 100% oxygen Induction Type: IV induction, Cricoid Pressure applied and Rapid sequence Laryngoscope Size: Mac and 4 Grade View: Grade I Tube type: Oral Tube size: 7.0 mm Number of attempts: 1 Airway Equipment and Method: Stylet Placement Confirmation: ETT inserted through vocal cords under direct vision, positive ETCO2 and breath sounds checked- equal and bilateral Secured at: 21 cm Tube secured with: Tape Dental Injury: Teeth and Oropharynx as per pre-operative assessment

## 2021-05-24 NOTE — Interval H&P Note (Signed)
History and Physical Interval Note:  05/24/2021 7:47 AM  Raymond Jacobs  has presented today for surgery, with the diagnosis of Achalasia  Dysphagia.  The various methods of treatment have been discussed with the patient and family. After consideration of risks, benefits and other options for treatment, the patient has consented to  Procedure(s) with comments: ESOPHAGOGASTRODUODENOSCOPY (EGD) (N/A) - General Anesthesia as a surgical intervention.  The patient's history has been reviewed, patient examined, no change in status, stable for surgery.  I have reviewed the patient's chart and labs.  Questions were answered to the patient's satisfaction.     Lorianna Spadaccini

## 2021-05-24 NOTE — Op Note (Signed)
[facility] Patient Name: Raymond Jacobs Procedure Date: 05/24/2021 MRN: [id number] Attending MD: Mauri Pole , MD Date of Birth: 04/24/1951 CSN: [id number] Age: 70 Admit Type: Outpatient Procedure:                Upper GI endoscopy Indications:              Dysphagia, Achalasia, Follow-up of achalasia, For                            balloon dilation of achalasia Providers:                Mauri Pole, MD, Janee Morn,                            Technician, Dulcy Fanny Referring MD:              Medicines:                Monitored Anesthesia Care, General Anesthesia Complications:            No immediate complications. Estimated Blood Loss:     Estimated blood loss was minimal. Procedure:                Pre-Anesthesia Assessment:                           - Prior to the procedure, a History and Physical                            was performed, and patient medications and                            allergies were reviewed. The patient's tolerance of                            previous anesthesia was also reviewed. The risks                            and benefits of the procedure and the sedation                            options and risks were discussed with the patient.                            All questions were answered, and informed consent                            was obtained. Prior Anticoagulants: The patient has                            taken no previous anticoagulant or antiplatelet                            agents. ASA Grade Assessment: III - A patient with  severe systemic disease. After reviewing the risks                            and benefits, the patient was deemed in                            satisfactory condition to undergo the procedure.                           After obtaining informed consent, the endoscope was                            passed under direct vision. Throughout the                             procedure, the patient's blood pressure, pulse, and                            oxygen saturations were monitored continuously. The                            [id number] Olympus endoscope was introduced                            through the mouth, and advanced to the second part                            of duodenum. The upper GI endoscopy was technically                            difficult and complex due to abnormal anatomy. The                            patient tolerated the procedure well. Scope In: Scope Out: Findings:      Food was found in the entire esophagus. Removal of food was       accomplished. Lavage of the area was performed, resulting in clearance       with excellent visualization.      The lumen of the esophagus was severely dilated and tortous in the       distal esophagus with tight EG junction. Mucosa appeared normal with no       mass lesions.      One moderate stenosis was found 38 to 40 cm from the incisors. The       stenosis was traversed. A TTS dilator was passed through the scope.       Dilation with an 18-19-20 mm balloon dilator was performed to 20 mm. The       dilation site was examined following endoscope reinsertion and showed       mild mucosal disruption.      Patchy mild inflammation characterized by congestion (edema) and       erythema was found in the entire examined stomach. Biopsies were taken       with a cold forceps for Helicobacter pylori testing.      The cardia and gastric fundus were  normal on retroflexion.      Few non-bleeding superficial duodenal ulcers with no stigmata of       bleeding were found in the duodenal bulb, in the first portion of the       duodenum and in the second portion of the duodenum. The largest lesion       was 6 mm in largest dimension. Biopsies were taken with a cold forceps       for histology. Impression:               - Food in the esophagus. Removal was successful.                            - Dilation in the entire esophagus. Tortous distal                            esophagus with tight EG junction.                           - Esophageal stenosis. Dilated.                           - Gastritis. Biopsied.                           - Non-bleeding duodenal ulcers with no stigmata of                            bleeding. Biopsied. Moderate Sedation:      Not Applicable - Patient had care per Anesthesia. Recommendation:           - Full liquid diet today and slowly advance to soft                            /low reisdue diet as tolerated.                           - Continue present medications.                           - No aspirin, ibuprofen, naproxen, or other                            non-steroidal anti-inflammatory drugs.                           - Use Prevacid (lansoprazole) 30 mg PO BID.                           - Refer to Dr Kipp Brood to discuss [facility] Myotomy                            for management of achalasia                           - Return in GI office in 2-3 months Procedure Code(s):        --- Professional ---  [phone], Esophagogastroduodenoscopy, flexible,                            transoral; with removal of foreign body(s)                           43249, Esophagogastroduodenoscopy, flexible,                            transoral; with transendoscopic balloon dilation of                            esophagus (less than 30 mm diameter)                           43239, 59, Esophagogastroduodenoscopy, flexible,                            transoral; with biopsy, single or multiple Diagnosis Code(s):        --- Professional ---                           K22.8, Other specified diseases of esophagus                           T18.128A, Food in esophagus causing other injury,                            initial encounter                           K22.2, Esophageal obstruction                           K29.70, Gastritis,  unspecified, without bleeding                           K26.9, Duodenal ulcer, unspecified as acute or                            chronic, without hemorrhage or perforation                           R13.10, Dysphagia, unspecified                           K22.0, Achalasia of cardia CPT copyright 2019 [employer]. All rights reserved. The codes documented in this report are preliminary and upon coder review may  be revised to meet current compliance requirements. Mauri Pole, MD 05/24/2021 9:22:04 AM This report has been signed electronically. Number of Addenda: 0

## 2021-05-24 NOTE — Discharge Instructions (Signed)
YOU HAD AN ENDOSCOPIC PROCEDURE TODAY: Refer to the procedure report and other information in the discharge instructions given to you for any specific questions about what was found during the examination. If this information does not answer your questions, please call [place] office at [phone] to clarify.   YOU SHOULD EXPECT: Some feelings of bloating in the abdomen. Passage of more gas than usual. Walking can help get rid of the air that was put into your GI tract during the procedure and reduce the bloating. If you had a lower endoscopy (such as a colonoscopy or flexible sigmoidoscopy) you may notice spotting of blood in your stool or on the toilet paper. Some abdominal soreness may be present for a day or two, also.  DIET: Your first meal following the procedure should be a light meal and then it is ok to progress to your normal diet. A half-sandwich or bowl of soup is an example of a good first meal. Heavy or fried foods are harder to digest and may make you feel nauseous or bloated. Drink plenty of fluids but you should avoid alcoholic beverages for 24 hours. If you had a esophageal dilation, please see attached instructions for diet.    ACTIVITY: Your care partner should take you home directly after the procedure. You should plan to take it easy, moving slowly for the rest of the day. You can resume normal activity the day after the procedure however YOU SHOULD NOT DRIVE, use power tools, machinery or perform tasks that involve climbing or major physical exertion for 24 hours (because of the sedation medicines used during the test).   SYMPTOMS TO REPORT IMMEDIATELY: A gastroenterologist can be reached at any hour. Please call [phone]  for any of the following symptoms:   Following upper endoscopy (EGD, EUS, ERCP, esophageal dilation) Vomiting of blood or coffee ground material  New, significant abdominal pain  New, significant chest pain or pain under the shoulder blades  Painful or  persistently difficult swallowing  New shortness of breath  Black, tarry-looking or red, bloody stools  FOLLOW UP:  If any biopsies were taken you will be contacted by phone or by letter within the next 1-3 weeks. Call [phone]  if you have not heard about the biopsies in 3 weeks.  Please also call with any specific questions about appointments or follow up tests.  

## 2021-05-25 ENCOUNTER — Encounter (HOSPITAL_COMMUNITY): Payer: Self-pay | Admitting: Gastroenterology

## 2021-05-25 LAB — SURGICAL PATHOLOGY

## 2021-05-25 NOTE — Anesthesia Postprocedure Evaluation (Signed)
Anesthesia Post Note  Patient: Raymond Jacobs  Procedure(s) Performed: ESOPHAGOGASTRODUODENOSCOPY (EGD) Balloon dilation wire-guided BIOPSY FOREIGN BODY REMOVAL     Patient location during evaluation: Endoscopy Anesthesia Type: General Level of consciousness: awake and alert Pain management: pain level controlled Vital Signs Assessment: post-procedure vital signs reviewed and stable Respiratory status: spontaneous breathing, nonlabored ventilation, respiratory function stable and patient connected to nasal cannula oxygen Cardiovascular status: stable and blood pressure returned to baseline Postop Assessment: no apparent nausea or vomiting Anesthetic complications: no   No notable events documented.  Last Vitals:  Vitals:   05/24/21 0930 05/24/21 0941  BP: 111/62 113/74  Pulse: 74 76  Resp: 16 17  Temp:    SpO2: 97% 96%    Last Pain:  Vitals:   05/24/21 0941  TempSrc:   PainSc: 0-No pain                 Gerica Koble

## 2021-06-11 ENCOUNTER — Encounter: Payer: Self-pay | Admitting: *Deleted

## 2021-06-11 ENCOUNTER — Encounter: Payer: Self-pay | Admitting: Thoracic Surgery (Cardiothoracic Vascular Surgery)

## 2021-06-11 ENCOUNTER — Other Ambulatory Visit: Payer: Self-pay | Admitting: *Deleted

## 2021-06-11 ENCOUNTER — Other Ambulatory Visit: Payer: Self-pay

## 2021-06-11 ENCOUNTER — Institutional Professional Consult (permissible substitution) (INDEPENDENT_AMBULATORY_CARE_PROVIDER_SITE_OTHER): Payer: Medicare Other | Admitting: Thoracic Surgery (Cardiothoracic Vascular Surgery)

## 2021-06-11 VITALS — BP 128/70 | HR 73 | Resp 20 | Ht 62.5 in | Wt 118.0 lb

## 2021-06-11 DIAGNOSIS — Z8719 Personal history of other diseases of the digestive system: Secondary | ICD-10-CM

## 2021-06-11 DIAGNOSIS — K2289 Other specified disease of esophagus: Secondary | ICD-10-CM
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
## 2021-06-25 NOTE — Progress Notes (Signed)
Surgical Instructions    Your procedure is scheduled on Thursday, September 8th, 2022.   Report to [facility] Main Entrance "A" at 09:30 A.M., then check in with the Admitting office.  Call this number if you have problems the morning of surgery:  [phone]   If you have any questions prior to your surgery date call [phone]: Open Monday-Friday 8am-4pm    Remember:  Do not eat or drink after midnight the night before your surgery   Take these medicines the morning of surgery with A SIP OF WATER:  acetaminophen (TYLENOL) - if needed albuterol (VENTOLIN HFA) - please, bring the inhaler with you the day of surgery - if needed Naphazoline-Glycerin (CLEAR EYES REDNESS RELIEF OP) - if needed   As of today, STOP taking any Aspirin (unless otherwise instructed by your surgeon) Aleve, Naproxen, Ibuprofen, Motrin, Advil, Goody's, BC's, all herbal medications, fish oil, and all vitamins.           Do not wear jewelry  Do not wear lotions, powders, colognes, or deodorant. Men may shave face and neck. Do not bring valuables to the hospital.              [facility] is not responsible for any belongings or valuables.  Do NOT Smoke (Tobacco/Vaping)  24 hours prior to your procedure If you use a CPAP at night, you may bring all equipment for your overnight stay.   Contacts, glasses, dentures or bridgework may not be worn into surgery, please bring cases for these belongings   For patients admitted to the hospital, discharge time will be determined by your treatment team.   Patients discharged the day of surgery will not be allowed to drive home, and someone needs to stay with them for 24 hours.  ONLY 1 SUPPORT PERSON MAY BE PRESENT WHILE YOU ARE IN SURGERY. IF YOU ARE TO BE ADMITTED ONCE YOU ARE IN YOUR ROOM YOU WILL BE ALLOWED TWO (2) VISITORS.  Minor children may have two parents present. Special consideration for safety and communication needs will be reviewed on a case by case  basis.  Special instructions:    Oral Hygiene is also important to reduce your risk of infection.  Remember - BRUSH YOUR TEETH THE MORNING OF SURGERY WITH YOUR REGULAR TOOTHPASTE   [facility]- Preparing For Surgery  Before surgery, you can play an important role. Because skin is not sterile, your skin needs to be as free of germs as possible. You can reduce the number of germs on your skin by washing with CHG (chlorahexidine gluconate) Soap before surgery.  CHG is an antiseptic cleaner which kills germs and bonds with the skin to continue killing germs even after washing.     Please do not use if you have an allergy to CHG or antibacterial soaps. If your skin becomes reddened/irritated stop using the CHG.  Do not shave (including legs and underarms) for at least 48 hours prior to first CHG shower. It is OK to shave your face.  Please follow these instructions carefully.     Shower the NIGHT BEFORE SURGERY and the MORNING OF SURGERY with CHG Soap.   If you chose to wash your hair, wash your hair first as usual with your normal shampoo. After you shampoo, rinse your hair and body thoroughly to remove the shampoo.  Then [employer] and genitals (private parts) with your normal soap and rinse thoroughly to remove soap.  After that Use CHG Soap as you would any  other liquid soap. You can apply CHG directly to the skin and wash gently with a scrungie or a clean washcloth.   Apply the CHG Soap to your body ONLY FROM THE NECK DOWN.  Do not use on open wounds or open sores. Avoid contact with your eyes, ears, mouth and genitals (private parts). Wash Face and genitals (private parts)  with your normal soap.   Wash thoroughly, paying special attention to the area where your surgery will be performed.  Thoroughly rinse your body with warm water from the neck down.  DO NOT shower/wash with your normal soap after using and rinsing off the CHG Soap.  Pat yourself dry with a CLEAN TOWEL.  Wear CLEAN  PAJAMAS to bed the night before surgery  Place CLEAN SHEETS on your bed the night before your surgery  DO NOT SLEEP WITH PETS.   Day of Surgery:  Take a shower with CHG soap. Wear Clean/Comfortable clothing the morning of surgery Do not apply any deodorants/lotions.   Remember to brush your teeth WITH YOUR REGULAR TOOTHPASTE.   Please read over the following fact sheets that you were given.

## 2021-06-29 ENCOUNTER — Other Ambulatory Visit: Payer: Self-pay

## 2021-06-29 ENCOUNTER — Encounter (HOSPITAL_COMMUNITY): Payer: Self-pay

## 2021-06-29 ENCOUNTER — Ambulatory Visit (HOSPITAL_COMMUNITY)
Admission: RE | Admit: 2021-06-29 | Discharge: 2021-06-29 | Disposition: A | Discharge status: Home / Self Care | Payer: Medicare Other | Source: Ambulatory Visit | Attending: Thoracic Surgery (Cardiothoracic Vascular Surgery) | Admitting: Thoracic Surgery (Cardiothoracic Vascular Surgery)

## 2021-06-29 ENCOUNTER — Encounter (HOSPITAL_COMMUNITY)
Admission: RE | Admit: 2021-06-29 | Discharge: 2021-06-29 | Disposition: A | Discharge status: Home / Self Care | Payer: Medicare Other | Source: Ambulatory Visit | Attending: Thoracic Surgery (Cardiothoracic Vascular Surgery) | Admitting: Thoracic Surgery (Cardiothoracic Vascular Surgery)

## 2021-06-29 DIAGNOSIS — K2289 Other specified disease of esophagus: Secondary | ICD-10-CM | POA: Insufficient documentation

## 2021-06-29 DIAGNOSIS — Z01818 Encounter for other preprocedural examination: Secondary | ICD-10-CM | POA: Insufficient documentation

## 2021-06-29 DIAGNOSIS — Z8719 Personal history of other diseases of the digestive system: Secondary | ICD-10-CM | POA: Insufficient documentation

## 2021-06-29 DIAGNOSIS — J439 Emphysema, unspecified: Secondary | ICD-10-CM | POA: Insufficient documentation

## 2021-06-29 DIAGNOSIS — Z20822 Contact with and (suspected) exposure to covid-19: Secondary | ICD-10-CM | POA: Insufficient documentation

## 2021-06-29 HISTORY — DX: Unspecified asthma, uncomplicated: J45.909

## 2021-06-29 HISTORY — DX: Gastro-esophageal reflux disease without esophagitis: K21.9

## 2021-06-29 LAB — CBC
HCT: 34.1 % — ABNORMAL LOW (ref 39.0–52.0)
Hemoglobin: 11.1 g/dL — ABNORMAL LOW (ref 13.0–17.0)
MCH: 28.6 pg (ref 26.0–34.0)
MCHC: 32.6 g/dL (ref 30.0–36.0)
MCV: 87.9 fL (ref 80.0–100.0)
Platelets: 388 10*3/uL (ref 150–400)
RBC: 3.88 MIL/uL — ABNORMAL LOW (ref 4.22–5.81)
RDW: 14.3 % (ref 11.5–15.5)
WBC: 7.8 10*3/uL (ref 4.0–10.5)
nRBC: 0 % (ref 0.0–0.2)

## 2021-06-29 LAB — BLOOD GAS, ARTERIAL
Acid-base deficit: 2.8 mmol/L — ABNORMAL HIGH (ref 0.0–2.0)
Bicarbonate: 21.2 mmol/L (ref 20.0–28.0)
Drawn by: 602861
FIO2: 21
O2 Saturation: 97 %
Patient temperature: 37
pCO2 arterial: 35.3 mmHg (ref 32.0–48.0)
pH, Arterial: 7.396 (ref 7.350–7.450)
pO2, Arterial: 90.3 mmHg (ref 83.0–108.0)

## 2021-06-29 LAB — SURGICAL PCR SCREEN
MRSA, PCR: NEGATIVE
Staphylococcus aureus: NEGATIVE

## 2021-06-29 LAB — URINALYSIS, ROUTINE W REFLEX MICROSCOPIC
Bilirubin Urine: NEGATIVE
Glucose, UA: NEGATIVE mg/dL
Hgb urine dipstick: NEGATIVE
Ketones, ur: NEGATIVE mg/dL
Leukocytes,Ua: NEGATIVE
Nitrite: NEGATIVE
Protein, ur: NEGATIVE mg/dL
Specific Gravity, Urine: 1.006 (ref 1.005–1.030)
pH: 7 (ref 5.0–8.0)

## 2021-06-29 LAB — TYPE AND SCREEN
ABO/RH(D): A POS
Antibody Screen: NEGATIVE

## 2021-06-29 LAB — COMPREHENSIVE METABOLIC PANEL
ALT: 11 U/L (ref 0–44)
AST: 25 U/L (ref 15–41)
Albumin: 3.7 g/dL (ref 3.5–5.0)
Alkaline Phosphatase: 53 U/L (ref 38–126)
Anion gap: 7 (ref 5–15)
BUN: 5 mg/dL — ABNORMAL LOW (ref 8–23)
CO2: 21 mmol/L — ABNORMAL LOW (ref 22–32)
Calcium: 9.1 mg/dL (ref 8.9–10.3)
Chloride: 105 mmol/L (ref 98–111)
Creatinine, Ser: 0.8 mg/dL (ref 0.61–1.24)
GFR, Estimated: >60 mL/min (ref >60)
Glucose, Bld: 139 mg/dL — ABNORMAL HIGH (ref 70–99)
Potassium: 4.1 mmol/L (ref 3.5–5.1)
Sodium: 133 mmol/L — ABNORMAL LOW (ref 135–145)
Total Bilirubin: 0.7 mg/dL (ref 0.3–1.2)
Total Protein: 6.3 g/dL — ABNORMAL LOW (ref 6.5–8.1)

## 2021-06-29 LAB — APTT: aPTT: 30 seconds (ref 24–36)

## 2021-06-29 LAB — PROTIME-INR
INR: 1 (ref 0.8–1.2)
Prothrombin Time: 13.2 seconds (ref 11.4–15.2)

## 2021-06-29 LAB — SARS CORONAVIRUS 2 (TAT 6-24 HRS): SARS Coronavirus 2: NEGATIVE

## 2021-06-29 NOTE — Progress Notes (Addendum)
PCP - denies Cardiologist - denies  PPM/ICD - denies Device Orders - N/A Rep Notified - N/A  Chest x-ray - 06/29/2021 EKG - 06/29/2021 Stress Test - denies ECHO - 04/04/2018 Cardiac Cath - denies  Sleep Study - denies CPAP - N/A  Fasting Blood Sugar - N/A  Blood Thinner Instructions: N/A  Aspirin Instructions: Patient was instructed: As of today, STOP taking any Aspirin (unless otherwise instructed by your surgeon) Aleve, Naproxen, Ibuprofen, Motrin, Advil, Goody's, BC's, all herbal medications, fish oil, and all vitamins.  ERAS Protcol - No  COVID TEST- done in PAT 06/29/2021   Anesthesia review: yes  Patient denies shortness of breath, fever, cough and chest pain at PAT appointment   All instructions explained to the patient, with a verbal understanding of the material. Patient agrees to go over the instructions while at home for a better understanding. Patient also instructed to self quarantine after being tested for COVID-19. The opportunity to ask questions was provided.

## 2021-07-01 ENCOUNTER — Other Ambulatory Visit: Payer: Self-pay

## 2021-07-01 ENCOUNTER — Inpatient Hospital Stay (HOSPITAL_COMMUNITY): Payer: Medicare Other | Admitting: Emergency Medicine

## 2021-07-01 ENCOUNTER — Observation Stay (HOSPITAL_COMMUNITY): Payer: Medicare Other

## 2021-07-01 ENCOUNTER — Inpatient Hospital Stay (HOSPITAL_COMMUNITY)
Admission: RE | Admit: 2021-07-01 | Discharge: 2021-07-03 | DRG: 328 | Disposition: A | Discharge status: Home / Self Care | Payer: Medicare Other | Source: Ambulatory Visit | Attending: Thoracic Surgery (Cardiothoracic Vascular Surgery) | Admitting: Thoracic Surgery (Cardiothoracic Vascular Surgery)

## 2021-07-01 ENCOUNTER — Encounter (HOSPITAL_COMMUNITY)
Admission: RE | Disposition: A | Discharge status: Home / Self Care | Payer: Self-pay | Source: Ambulatory Visit | Attending: Thoracic Surgery (Cardiothoracic Vascular Surgery)

## 2021-07-01 ENCOUNTER — Encounter (HOSPITAL_COMMUNITY): Payer: Self-pay | Admitting: Thoracic Surgery (Cardiothoracic Vascular Surgery)

## 2021-07-01 ENCOUNTER — Inpatient Hospital Stay (HOSPITAL_COMMUNITY): Payer: Medicare Other | Admitting: Certified Registered Nurse Anesthetist

## 2021-07-01 DIAGNOSIS — J45909 Unspecified asthma, uncomplicated: Secondary | ICD-10-CM | POA: Diagnosis present

## 2021-07-01 DIAGNOSIS — K222 Esophageal obstruction: Secondary | ICD-10-CM | POA: Diagnosis present

## 2021-07-01 DIAGNOSIS — Z87891 Personal history of nicotine dependence: Secondary | ICD-10-CM

## 2021-07-01 DIAGNOSIS — Z800 Family history of malignant neoplasm of digestive organs: Secondary | ICD-10-CM

## 2021-07-01 DIAGNOSIS — Z8711 Personal history of peptic ulcer disease: Secondary | ICD-10-CM

## 2021-07-01 DIAGNOSIS — Z8719 Personal history of other diseases of the digestive system: Secondary | ICD-10-CM

## 2021-07-01 DIAGNOSIS — Z09 Encounter for follow-up examination after completed treatment for conditions other than malignant neoplasm: Secondary | ICD-10-CM

## 2021-07-01 DIAGNOSIS — K220 Achalasia of cardia: Principal | ICD-10-CM | POA: Diagnosis present

## 2021-07-01 DIAGNOSIS — Z20822 Contact with and (suspected) exposure to covid-19: Secondary | ICD-10-CM | POA: Diagnosis present

## 2021-07-01 DIAGNOSIS — K660 Peritoneal adhesions (postprocedural) (postinfection): Secondary | ICD-10-CM | POA: Diagnosis present

## 2021-07-01 DIAGNOSIS — Z9889 Other specified postprocedural states: Secondary | ICD-10-CM

## 2021-07-01 DIAGNOSIS — Z833 Family history of diabetes mellitus: Secondary | ICD-10-CM

## 2021-07-01 HISTORY — PX: ESOPHAGOGASTRODUODENOSCOPY: SHX5428

## 2021-07-01 LAB — ABO/RH: ABO/RH(D): A POS

## 2021-07-01 SURGERY — ESOPHAGOMYOTOMY, ROBOT-ASSISTED, HELLER
Anesthesia: General | Site: Esophagus

## 2021-07-01 MED ORDER — DEXAMETHASONE SODIUM PHOSPHATE 10 MG/ML IJ SOLN
INTRAMUSCULAR | Status: AC
  Filled 2021-07-01: qty 1

## 2021-07-01 MED ORDER — ENOXAPARIN SODIUM 30 MG/0.3ML IJ SOSY
30.0000 mg | PREFILLED_SYRINGE | INTRAMUSCULAR | Status: DC
Start: 2021-07-02 — End: 2021-07-03
  Administered 2021-07-02: 30 mg via SUBCUTANEOUS
  Filled 2021-07-01: qty 0.3

## 2021-07-01 MED ORDER — OXYCODONE HCL 5 MG PO TABS
5.0000 mg | ORAL_TABLET | Freq: Once | ORAL | Status: DC | PRN
Start: 2021-07-01 — End: 2021-07-01

## 2021-07-01 MED ORDER — FENTANYL CITRATE (PF) 100 MCG/2ML IJ SOLN
INTRAMUSCULAR | Status: AC
  Filled 2021-07-01: qty 2

## 2021-07-01 MED ORDER — MORPHINE SULFATE (PF) 2 MG/ML IV SOLN: 2.0000 mg | INTRAVENOUS | Status: DC | PRN

## 2021-07-01 MED ORDER — SODIUM CHLORIDE 0.45 % IV SOLN: INTRAVENOUS | Status: DC

## 2021-07-01 MED ORDER — ACETAMINOPHEN 160 MG/5ML PO SOLN: 325.0000 mg | ORAL | Status: DC | PRN

## 2021-07-01 MED ORDER — CEFAZOLIN SODIUM-DEXTROSE 2-4 GM/100ML-% IV SOLN
INTRAVENOUS | Status: AC
  Filled 2021-07-01: qty 100

## 2021-07-01 MED ORDER — FENTANYL CITRATE (PF) 250 MCG/5ML IJ SOLN
INTRAMUSCULAR | Status: DC | PRN
  Administered 2021-07-01 (×4): 50 ug via INTRAVENOUS

## 2021-07-01 MED ORDER — ONDANSETRON HCL 4 MG/2ML IJ SOLN
INTRAMUSCULAR | Status: DC | PRN
  Administered 2021-07-01: 4 mg via INTRAVENOUS

## 2021-07-01 MED ORDER — BUPIVACAINE LIPOSOME 1.3 % IJ SUSP
INTRAMUSCULAR | Status: DC | PRN
  Administered 2021-07-01: 50 mL

## 2021-07-01 MED ORDER — ORAL CARE MOUTH RINSE: 15.0000 mL | Freq: Once | OROMUCOSAL | Status: AC

## 2021-07-01 MED ORDER — ONDANSETRON HCL 4 MG/2ML IJ SOLN
4.0000 mg | Freq: Four times a day (QID) | INTRAMUSCULAR | Status: DC
  Administered 2021-07-01 – 2021-07-02 (×6): 4 mg via INTRAVENOUS
  Filled 2021-07-01 (×6): qty 2

## 2021-07-01 MED ORDER — OXYCODONE HCL 5 MG/5ML PO SOLN
5.0000 mg | Freq: Once | ORAL | Status: DC | PRN
Start: 2021-07-01 — End: 2021-07-01

## 2021-07-01 MED ORDER — ALBUTEROL SULFATE (2.5 MG/3ML) 0.083% IN NEBU: 2.5000 mg | INHALATION_SOLUTION | RESPIRATORY_TRACT | Status: DC | PRN

## 2021-07-01 MED ORDER — FENTANYL CITRATE (PF) 100 MCG/2ML IJ SOLN
25.0000 ug | INTRAMUSCULAR | Status: DC | PRN
  Administered 2021-07-01 (×2): 50 ug via INTRAVENOUS

## 2021-07-01 MED ORDER — BUPIVACAINE HCL (PF) 0.5 % IJ SOLN
INTRAMUSCULAR | Status: AC
  Filled 2021-07-01: qty 30

## 2021-07-01 MED ORDER — ACETAMINOPHEN 10 MG/ML IV SOLN
1000.0000 mg | Freq: Once | INTRAVENOUS | Status: DC | PRN
  Administered 2021-07-01: 1000 mg via INTRAVENOUS

## 2021-07-01 MED ORDER — LIDOCAINE 2% (20 MG/ML) 5 ML SYRINGE
INTRAMUSCULAR | Status: AC
  Filled 2021-07-01: qty 5

## 2021-07-01 MED ORDER — ACETAMINOPHEN 10 MG/ML IV SOLN
INTRAVENOUS | Status: AC
  Filled 2021-07-01: qty 100

## 2021-07-01 MED ORDER — LIDOCAINE 2% (20 MG/ML) 5 ML SYRINGE
INTRAMUSCULAR | Status: DC | PRN
  Administered 2021-07-01: 40 mg via INTRAVENOUS

## 2021-07-01 MED ORDER — FENTANYL CITRATE (PF) 250 MCG/5ML IJ SOLN
INTRAMUSCULAR | Status: AC
  Filled 2021-07-01: qty 5

## 2021-07-01 MED ORDER — HEMOSTATIC AGENTS (NO CHARGE) OPTIME
TOPICAL | Status: DC | PRN
  Administered 2021-07-01: 1 via TOPICAL

## 2021-07-01 MED ORDER — SUGAMMADEX SODIUM 200 MG/2ML IV SOLN
INTRAVENOUS | Status: DC | PRN
  Administered 2021-07-01 (×2): 100 mg via INTRAVENOUS

## 2021-07-01 MED ORDER — ROCURONIUM BROMIDE 10 MG/ML (PF) SYRINGE
PREFILLED_SYRINGE | INTRAVENOUS | Status: DC | PRN
  Administered 2021-07-01: 20 mg via INTRAVENOUS
  Administered 2021-07-01: 60 mg via INTRAVENOUS
  Administered 2021-07-01: 20 mg via INTRAVENOUS

## 2021-07-01 MED ORDER — LACTATED RINGERS IV SOLN: INTRAVENOUS | Status: DC | PRN

## 2021-07-01 MED ORDER — KETOROLAC TROMETHAMINE 15 MG/ML IJ SOLN
15.0000 mg | Freq: Four times a day (QID) | INTRAMUSCULAR | Status: DC | PRN
  Administered 2021-07-01 – 2021-07-02 (×2): 15 mg via INTRAVENOUS
  Filled 2021-07-01 (×2): qty 1

## 2021-07-01 MED ORDER — PROPOFOL 10 MG/ML IV BOLUS
INTRAVENOUS | Status: AC
  Filled 2021-07-01: qty 20

## 2021-07-01 MED ORDER — CHLORHEXIDINE GLUCONATE 0.12 % MT SOLN
OROMUCOSAL | Status: AC
  Administered 2021-07-01: 15 mL via OROMUCOSAL
  Filled 2021-07-01: qty 15

## 2021-07-01 MED ORDER — ACETAMINOPHEN 10 MG/ML IV SOLN
1000.0000 mg | Freq: Four times a day (QID) | INTRAVENOUS | Status: AC
Start: 2021-07-01 — End: 2021-07-02
  Administered 2021-07-01 – 2021-07-02 (×4): 1000 mg via INTRAVENOUS
  Filled 2021-07-01 (×4): qty 100

## 2021-07-01 MED ORDER — ALBUMIN HUMAN 5 % IV SOLN
INTRAVENOUS | Status: DC | PRN
Start: 2021-07-01 — End: 2021-07-01

## 2021-07-01 MED ORDER — BUPIVACAINE LIPOSOME 1.3 % IJ SUSP
INTRAMUSCULAR | Status: AC
  Filled 2021-07-01: qty 20

## 2021-07-01 MED ORDER — CEFAZOLIN SODIUM-DEXTROSE 2-4 GM/100ML-% IV SOLN
2.0000 g | INTRAVENOUS | Status: AC
  Administered 2021-07-01: 2 g via INTRAVENOUS

## 2021-07-01 MED ORDER — PHENYLEPHRINE HCL-NACL 20-0.9 MG/250ML-% IV SOLN
INTRAVENOUS | Status: DC | PRN
  Administered 2021-07-01: 30 ug/min via INTRAVENOUS

## 2021-07-01 MED ORDER — SUGAMMADEX SODIUM 500 MG/5ML IV SOLN
INTRAVENOUS | Status: AC
  Filled 2021-07-01: qty 5

## 2021-07-01 MED ORDER — PROMETHAZINE HCL 25 MG/ML IJ SOLN: 6.2500 mg | INTRAMUSCULAR | Status: DC | PRN

## 2021-07-01 MED ORDER — 0.9 % SODIUM CHLORIDE (POUR BTL) OPTIME
TOPICAL | Status: DC | PRN
  Administered 2021-07-01: 2000 mL

## 2021-07-01 MED ORDER — ACETAMINOPHEN 325 MG PO TABS: 325.0000 mg | ORAL_TABLET | ORAL | Status: DC | PRN

## 2021-07-01 MED ORDER — AMISULPRIDE (ANTIEMETIC) 5 MG/2ML IV SOLN: 10.0000 mg | Freq: Once | INTRAVENOUS | Status: DC | PRN

## 2021-07-01 MED ORDER — LACTATED RINGERS IV SOLN: INTRAVENOUS | Status: DC

## 2021-07-01 MED ORDER — CEFAZOLIN SODIUM-DEXTROSE 2-3 GM-%(50ML) IV SOLR: INTRAVENOUS | Status: DC | PRN

## 2021-07-01 MED ORDER — GLYCOPYRROLATE PF 0.2 MG/ML IJ SOSY
PREFILLED_SYRINGE | INTRAMUSCULAR | Status: DC | PRN
  Administered 2021-07-01 (×2): .1 mg via INTRAVENOUS

## 2021-07-01 MED ORDER — CHLORHEXIDINE GLUCONATE 0.12 % MT SOLN: 15.0000 mL | Freq: Once | OROMUCOSAL | Status: AC

## 2021-07-01 MED ORDER — NAPHAZOLINE-GLYCERIN 0.012-0.25 % OP SOLN
1.0000 [drp] | Freq: Two times a day (BID) | OPHTHALMIC | Status: DC | PRN
  Filled 2021-07-01: qty 15

## 2021-07-01 MED ORDER — SUCCINYLCHOLINE CHLORIDE 200 MG/10ML IV SOSY
PREFILLED_SYRINGE | INTRAVENOUS | Status: DC | PRN
  Administered 2021-07-01: 120 mg via INTRAVENOUS

## 2021-07-01 MED ORDER — PHENYLEPHRINE 40 MCG/ML (10ML) SYRINGE FOR IV PUSH (FOR BLOOD PRESSURE SUPPORT)
PREFILLED_SYRINGE | INTRAVENOUS | Status: DC | PRN
  Administered 2021-07-01 (×4): 40 ug via INTRAVENOUS
  Administered 2021-07-01 (×3): 80 ug via INTRAVENOUS

## 2021-07-01 MED ORDER — ONDANSETRON HCL 4 MG/2ML IJ SOLN
INTRAMUSCULAR | Status: AC
  Filled 2021-07-01: qty 2

## 2021-07-01 MED ORDER — PROPOFOL 10 MG/ML IV BOLUS
INTRAVENOUS | Status: DC | PRN
  Administered 2021-07-01: 100 mg via INTRAVENOUS

## 2021-07-01 MED ORDER — DEXAMETHASONE SODIUM PHOSPHATE 10 MG/ML IJ SOLN
INTRAMUSCULAR | Status: DC | PRN
Start: 2021-07-01 — End: 2021-07-01
  Administered 2021-07-01: 10 mg via INTRAVENOUS

## 2021-07-01 MED ORDER — ALBUTEROL SULFATE HFA 108 (90 BASE) MCG/ACT IN AERS: 2.0000 | INHALATION_SPRAY | RESPIRATORY_TRACT | Status: DC | PRN

## 2021-07-01 SURGICAL SUPPLY — 100 items
BLADE CLIPPER SURG (BLADE) ×3 IMPLANT
BLADE SURG 11 STRL SS (BLADE) ×3 IMPLANT
BUTTON OLYMPUS DEFENDO 5 PIECE (MISCELLANEOUS) ×3 IMPLANT
CANISTER SUCT 3000ML PPV (MISCELLANEOUS) ×6 IMPLANT
CANNULA REDUC XI 12-8 STAPL (CANNULA)
CANNULA REDUCER 12-8 DVNC XI (CANNULA) IMPLANT
CATH THORACIC 28FR (CATHETERS) IMPLANT
CNTNR URN SCR LID CUP LEK RST (MISCELLANEOUS) ×4 IMPLANT
CONT SPEC 4OZ STRL OR WHT (MISCELLANEOUS) ×2
COVER BACK TABLE 60X90IN (DRAPES) ×3 IMPLANT
DECANTER SPIKE VIAL GLASS SM (MISCELLANEOUS) ×3 IMPLANT
DEFOGGER SCOPE WARMER CLEARIFY (MISCELLANEOUS) ×3 IMPLANT
DERMABOND ADVANCED (GAUZE/BANDAGES/DRESSINGS) ×1
DERMABOND ADVANCED .7 DNX12 (GAUZE/BANDAGES/DRESSINGS) ×2 IMPLANT
DEVICE SUTURE ENDOST 10MM (ENDOMECHANICALS) IMPLANT
DRAIN PENROSE 1/4X12 LTX STRL (WOUND CARE) ×3 IMPLANT
DRAPE ARM DVNC X/XI (DISPOSABLE) ×8 IMPLANT
DRAPE COLUMN DVNC XI (DISPOSABLE) ×2 IMPLANT
DRAPE CV SPLIT W-CLR ANES SCRN (DRAPES) ×3 IMPLANT
DRAPE DA VINCI XI ARM (DISPOSABLE) ×4
DRAPE DA VINCI XI COLUMN (DISPOSABLE) ×1
DRAPE INCISE IOBAN 66X45 STRL (DRAPES) IMPLANT
DRAPE ORTHO SPLIT 77X108 STRL (DRAPES) ×1
DRAPE SURG ORHT 6 SPLT 77X108 (DRAPES) ×2 IMPLANT
ELECT REM PT RETURN 9FT ADLT (ELECTROSURGICAL) ×3
ELECTRODE REM PT RTRN 9FT ADLT (ELECTROSURGICAL) ×2 IMPLANT
FELT TEFLON 1X6 (MISCELLANEOUS) IMPLANT
FORCEPS GRASP COMBO 8X230 (FORCEP) ×3 IMPLANT
GAUZE SPONGE 4X4 12PLY STRL (GAUZE/BANDAGES/DRESSINGS) ×3 IMPLANT
GLOVE SURG ENC MOIS LTX SZ7 (GLOVE) ×3 IMPLANT
GLOVE SURG ENC MOIS LTX SZ7.5 (GLOVE) ×9 IMPLANT
GLOVE SURG POLYISO LF SZ6.5 (GLOVE) ×3 IMPLANT
GOWN STRL REUS W/ TWL LRG LVL3 (GOWN DISPOSABLE) ×10 IMPLANT
GOWN STRL REUS W/ TWL XL LVL3 (GOWN DISPOSABLE) ×8 IMPLANT
GOWN STRL REUS W/TWL 2XL LVL3 (GOWN DISPOSABLE) ×6 IMPLANT
GOWN STRL REUS W/TWL LRG LVL3 (GOWN DISPOSABLE) ×5
GOWN STRL REUS W/TWL XL LVL3 (GOWN DISPOSABLE) ×4
GRASPER SUT TROCAR 14GX15 (MISCELLANEOUS) IMPLANT
HEMOSTAT SURGICEL 2X14 (HEMOSTASIS) ×6 IMPLANT
IRRIGATION STRYKERFLOW (MISCELLANEOUS) ×2 IMPLANT
IRRIGATOR STRYKERFLOW (MISCELLANEOUS) ×3
IRRIGATOR SUCT 8 DISP DVNC XI (IRRIGATION / IRRIGATOR) IMPLANT
IRRIGATOR SUCTION 8MM XI DISP (IRRIGATION / IRRIGATOR)
IV NS 1000ML (IV SOLUTION)
IV NS 1000ML BAXH (IV SOLUTION) IMPLANT
KIT BASIN OR (CUSTOM PROCEDURE TRAY) ×3 IMPLANT
KIT TURNOVER KIT B (KITS) ×3 IMPLANT
MARKER SKIN DUAL TIP RULER LAB (MISCELLANEOUS) ×3 IMPLANT
NEEDLE 18GX1X1/2 (RX/OR ONLY) (NEEDLE) IMPLANT
NEEDLE HYPO 22GX1.5 SAFETY (NEEDLE) ×3 IMPLANT
NEEDLE SCLEROTHERAPY 23X2X240 (NEEDLE) IMPLANT
NS IRRIG 1000ML POUR BTL (IV SOLUTION) ×6 IMPLANT
OBTURATOR OPTICAL STANDARD 8MM (TROCAR) ×1
OBTURATOR OPTICAL STND 8 DVNC (TROCAR) ×2
OBTURATOR OPTICALSTD 8 DVNC (TROCAR) ×2 IMPLANT
OIL SILICONE PENTAX (PARTS (SERVICE/REPAIRS)) IMPLANT
PACK CHEST (CUSTOM PROCEDURE TRAY) ×3 IMPLANT
PACK UNIVERSAL I (CUSTOM PROCEDURE TRAY) ×3 IMPLANT
PAD ARMBOARD 7.5X6 YLW CONV (MISCELLANEOUS) ×6 IMPLANT
SEAL CANN UNIV 5-8 DVNC XI (MISCELLANEOUS) ×8 IMPLANT
SEAL XI 5MM-8MM UNIVERSAL (MISCELLANEOUS) ×4
SEALER LIGASURE MARYLAND 30 (ELECTROSURGICAL) IMPLANT
SEALER SYNCHRO 8 IS4000 DV (MISCELLANEOUS)
SEALER SYNCHRO 8 IS4000 DVNC (MISCELLANEOUS) IMPLANT
SET IRRIG TUBING LAPAROSCOPIC (IRRIGATION / IRRIGATOR) IMPLANT
SET TUBE SMOKE EVAC HIGH FLOW (TUBING) ×3 IMPLANT
SHEET MEDIUM DRAPE 40X70 STRL (DRAPES) ×3 IMPLANT
SOL ANTI FOG 6CC (MISCELLANEOUS) IMPLANT
SOLUTION ANTI FOG 6CC (MISCELLANEOUS)
STAPLER CANNULA SEAL DVNC XI (STAPLE) IMPLANT
STAPLER CANNULA SEAL XI (STAPLE)
STOPCOCK 4 WAY LG BORE MALE ST (IV SETS) ×3 IMPLANT
SUT ETHIBOND 0 36 GRN (SUTURE) ×6 IMPLANT
SUT ETHIBOND 2 0 SH (SUTURE) ×2
SUT ETHIBOND 2 0 SH 36X2 (SUTURE) ×4 IMPLANT
SUT SILK  1 MH (SUTURE) ×1
SUT SILK 1 MH (SUTURE) ×2 IMPLANT
SUT SURGIDAC NAB ES-9 0 48 120 (SUTURE) IMPLANT
SUT VIC AB 3-0 SH 27 (SUTURE) ×2
SUT VIC AB 3-0 SH 27X BRD (SUTURE) ×4 IMPLANT
SUT VIC AB 3-0 X1 27 (SUTURE) IMPLANT
SUT VICRYL 0 TIES 12 18 (SUTURE) ×3 IMPLANT
SUT VICRYL 0 UR6 27IN ABS (SUTURE) ×6 IMPLANT
SYR 10ML LL (SYRINGE) ×3 IMPLANT
SYR 20CC LL (SYRINGE) ×3 IMPLANT
SYR 20ML ECCENTRIC (SYRINGE) ×3 IMPLANT
SYR 30ML SLIP (SYRINGE) ×3 IMPLANT
SYR 50ML LL SCALE MARK (SYRINGE) ×3 IMPLANT
SYSTEM SAHARA CHEST DRAIN ATS (WOUND CARE) ×3 IMPLANT
TOWEL GREEN STERILE (TOWEL DISPOSABLE) ×3 IMPLANT
TOWEL GREEN STERILE FF (TOWEL DISPOSABLE) ×3 IMPLANT
TRAY FOLEY MTR SLVR 16FR STAT (SET/KITS/TRAYS/PACK) ×3 IMPLANT
TROCAR PORT AIRSEAL 8X120 (TROCAR) ×3 IMPLANT
TROCAR XCEL BLADELESS 5X75MML (TROCAR) ×3 IMPLANT
TROCAR XCEL NON-BLD 5MMX100MML (ENDOMECHANICALS) IMPLANT
TUBE CONNECTING 20X1/4 (TUBING) ×3 IMPLANT
TUBING ENDO SMARTCAP (MISCELLANEOUS) ×3 IMPLANT
TUBING EXTENTION W/L.L. (IV SETS) ×3 IMPLANT
UNDERPAD 30X36 HEAVY ABSORB (UNDERPADS AND DIAPERS) ×3 IMPLANT
WATER STERILE IRR 1000ML POUR (IV SOLUTION) ×6 IMPLANT

## 2021-07-01 NOTE — Anesthesia Postprocedure Evaluation (Signed)
Anesthesia Post Note  Patient: Raymond Jacobs  Procedure(s) Performed: XI ROBOTIC [place] with fundoplication (Abdomen) ESOPHAGOGASTRODUODENOSCOPY (EGD) (Esophagus)     Patient location during evaluation: PACU Anesthesia Type: General Level of consciousness: awake and alert Pain management: pain level controlled Vital Signs Assessment: post-procedure vital signs reviewed and stable Respiratory status: spontaneous breathing, nonlabored ventilation, respiratory function stable and patient connected to nasal cannula oxygen Cardiovascular status: blood pressure returned to baseline and stable Postop Assessment: no apparent nausea or vomiting Anesthetic complications: no   No notable events documented.  Last Vitals:  Vitals:   07/01/21 1645 07/01/21 1700  BP: 129/75 133/83  Pulse: 67 74  Resp: 14 16  Temp:  (!) 36.1 C  SpO2: 97% 93%                Effie Berkshire

## 2021-07-01 NOTE — Hospital Course (Addendum)
History of Present Illness:  Raymond Jacobs 70 y.o. male referred by Dr. Silverio Decamp for surgical evaluation of achalasia.  The patient states that he has had dysphagia since his early 82s.  He also complains of some reflux.  He has always been a very small eater but does note some dysphagia and occasionally having to force emesis after eating.  He is undergone endoscopy on 4 occasions with dilations at each point.  The most recent dilation has been successful in allowing him to eat larger portions.  This dilation occurred on 05/24/2021.  In October 2019, he underwent an endoscopy with Botox injection.  Within our system, he has had no achalasia since at least 2013.  He was evaluated by Dr. Kipp Brood at which time he admitted to being very active.  He rides his bike pretty much wherever he goes states that he has a hard time sitting still.  Whenever he lays flat he does have issues with regurgitation.  Cross sectional imaging of his esophagus was reviewed and showed dilatation due to debris.  He was offered EGD with Heller Myotomy and fundoplication.  The patient wished to proceed.  Hospital Course:  Mr. Behlen presented to [facility] on 07/01/2021.  He was taken to the operating room and underwent Robotic Assisted Laparotomy with Lysis of Adhesions, Heller Myotomy, and Fundoplication.  He tolerated the procedure without difficulty and was taken to the PACU in stable condition. POD 1 he was tolerating room air. His pain was well controlled and his CXR was stable. His esophogram showed severely dilated and paulous esophagus consistent with achalasia history.  There is also concern of obstruction at GE junction related to post operative swelling.  Patient got up and ambulated.  CXR and KUB were obtained and showed the contrast media remained in the stomach.  Patient continued to ambulate and developed diarrhea and states he feels like he had cleared the media.  He was started on a clear liquid diet and  tolerated without difficulty.  Follow up CXR on 9/10 showed contrast media was no longer present in the stomach.  He was advanced to dysphagia diet and again tolerated this diet without difficulty.  Surgical incisions show no evidence of infection.  He was stable for discharge home today.

## 2021-07-01 NOTE — Interval H&P Note (Signed)
History and Physical Interval Note:  07/01/2021 9:36 AM  Raymond Jacobs  has presented today for surgery, with the diagnosis of Esophageal stricture.  The various methods of treatment have been discussed with the patient and family. After consideration of risks, benefits and other options for treatment, the patient has consented to  Procedure(s): XI ROBOTIC [place] with fundoplication (N/A) ESOPHAGOGASTRODUODENOSCOPY (EGD) (N/A) as a surgical intervention.  The patient's history has been reviewed, patient examined, no change in status, stable for surgery.  I have reviewed the patient's chart and labs.  Questions were answered to the patient's satisfaction.     Fortunato Nordin Bary Leriche

## 2021-07-01 NOTE — Brief Op Note (Signed)
07/01/2021  3:58 PM  PATIENT:  Raymond Jacobs  70 y.o. male  PRE-OPERATIVE DIAGNOSIS:  Esophageal stricture  POST-OPERATIVE DIAGNOSIS:  Esophageal stricture  PROCEDURE:    Procedure(s):  XI ROBOTIC ASSISTED HELLER MYOTOMY with fundoplication (N/A) ESOPHAGOGASTRODUODENOSCOPY (EGD) (N/A) TAKE DOWN OF ADHESIONS  SURGEON:  Surgeon(s) and Role:    * Lightfoot, Lucile Crater, MD - Primary  PHYSICIAN ASSISTANT: Tevyn Codd PA-C  ASSISTANTS: none   ANESTHESIA:   general  EBL:  25 mL   BLOOD ADMINISTERED:none  DRAINS: none   LOCAL MEDICATIONS USED:  BUPIVICAINE   SPECIMEN:  No Specimen  DISPOSITION OF SPECIMEN:  N/A  COUNTS:  YES  TOURNIQUET:  * No tourniquets in log *  DICTATION: .Dragon Dictation  PLAN OF CARE: Admit to inpatient   PATIENT DISPOSITION:  PACU - hemodynamically stable.   Delay start of Pharmacological VTE agent (>24hrs) due to surgical blood loss or risk of bleeding: no

## 2021-07-01 NOTE — Anesthesia Procedure Notes (Addendum)
Procedure Name: Intubation Date/Time: 07/01/2021 12:54 PM Performed by: Janene Harvey, CRNA Pre-anesthesia Checklist: Patient identified, Emergency Drugs available, Suction available and Patient being monitored Patient Re-evaluated:Patient Re-evaluated prior to induction Oxygen Delivery Method: Circle system utilized Preoxygenation: Pre-oxygenation with 100% oxygen Induction Type: IV induction Laryngoscope Size: Miller and 2 Grade View: Grade I Tube type: Oral Tube size: 7.0 mm Number of attempts: 1 Placement Confirmation: ETT inserted through vocal cords under direct vision, positive ETCO2, CO2 detector and breath sounds checked- equal and bilateral Secured at: 22 cm Dental Injury: Teeth and Oropharynx as per pre-operative assessment  Comments: Placed by [employer]

## 2021-07-01 NOTE — Transfer of Care (Signed)
Immediate Anesthesia Transfer of Care Note  Patient: Raymond Jacobs  Procedure(s) Performed: XI ROBOTIC [place] with fundoplication (Abdomen) ESOPHAGOGASTRODUODENOSCOPY (EGD) (Esophagus)  Patient Location: PACU  Anesthesia Type:General  Level of Consciousness: drowsy and patient cooperative  Airway & Oxygen Therapy: Patient Spontanous Breathing and Patient connected to face mask oxygen  Post-op Assessment: Report given to RN and Post -op Vital signs reviewed and stable  Post vital signs: Reviewed  Last Vitals:  Vitals Value Taken Time  BP 132/77 07/01/21 1612  Temp    Pulse 76 07/01/21 1617  Resp 16 07/01/21 1617  SpO2 100 % 07/01/21 1617  Vitals shown include unvalidated device data.  Last Pain:  Vitals:   07/01/21 0957  TempSrc:   PainSc: 0-No pain         Complications: No notable events documented.

## 2021-07-01 NOTE — Anesthesia Preprocedure Evaluation (Signed)
Anesthesia Evaluation  Patient identified by MRN, date of birth, ID band Patient awake    Reviewed: Allergy & Precautions, NPO status , Patient's Chart, lab work & pertinent test results  Airway Mallampati: I  TM Distance: >3 FB Neck ROM: Full    Dental  (+) Edentulous Upper, Lower Dentures, Dental Advisory Given   Pulmonary asthma , COPD, former smoker,    breath sounds clear to auscultation       Cardiovascular negative cardio ROS   Rhythm:Regular Rate:Normal     Neuro/Psych negative neurological ROS  negative psych ROS   GI/Hepatic Neg liver ROS, hiatal hernia, PUD, GERD  Medicated,  Endo/Other  negative endocrine ROS  Renal/GU negative Renal ROS     Musculoskeletal negative musculoskeletal ROS (+)   Abdominal   Peds  Hematology negative hematology ROS (+)   Anesthesia Other Findings   Reproductive/Obstetrics                             Anesthesia Physical Anesthesia Plan  ASA: 2  Anesthesia Plan: General   Post-op Pain Management:    Induction: Intravenous  PONV Risk Score and Plan: 3 and Ondansetron, Dexamethasone and Midazolam  Airway Management Planned: Oral ETT  Additional Equipment: Arterial line  Intra-op Plan:   Post-operative Plan: Extubation in OR  Informed Consent: I have reviewed the patients History and Physical, chart, labs and discussed the procedure including the risks, benefits and alternatives for the proposed anesthesia with the patient or authorized representative who has indicated his/her understanding and acceptance.     Dental advisory given  Plan Discussed with: CRNA  Anesthesia Plan Comments:         Anesthesia Quick Evaluation

## 2021-07-01 NOTE — Anesthesia Procedure Notes (Signed)
Arterial Line Insertion Start/End9/05/2021 10:30 AM Performed by: Janene Harvey, CRNA  Patient location: Pre-op. Preanesthetic checklist: patient identified, IV checked, risks and benefits discussed, monitors and equipment checked and pre-op evaluation Lidocaine 1% used for infiltration Left, radial was placed Catheter size: 20 G Hand hygiene performed  and maximum sterile barriers used  Allen's test indicative of satisfactory collateral circulation Attempts: 1 Procedure performed without using ultrasound guided technique. Following insertion, dressing applied and Biopatch. Post procedure assessment: unchanged  Patient tolerated the procedure well with no immediate complications. Additional procedure comments: Placed by SRNA T. Aines.

## 2021-07-01 NOTE — Op Note (Signed)
[address]       [place] [address]             [phone]        07/01/2021  Patient:  [name] Pre-Op Dx: Achalasia   Sigmoid esophagus Post-op Dx: Same Procedure: - Esophagoscopy - Robotic assisted laparoscopy -Extensive lysis of adhesions complicated case by 123456 -Heller myotomy - Dor Fundoplication   Surgeon and Role:      * Lajuana Matte, MD - Primary    *E. Barrett, PA-C- assisting  Anesthesia  general EBL: 50 ml Blood Administration: None Specimen: None   Counts: correct   Indications: 70 year old male with longstanding achalasia, and sigmoid esophagus.  On my review cross-sectional imaging, his esophagus is massively dilated with debris.  At every endoscopy that he has had they have had to clear retained food from his esophagus.  He is very interested in pursuing an EGD, robotic assisted Heller myotomy and fundoplication.  I explained to him that given the massive dilation of his esophagus there is still a strong chance that he may have some swallowing issues.  I think that this would be the first step in evaluating whether or not he would need an esophagectomy however.  He will need to be on a liquid diet for 3 days prior to surgery.  Also explained to him that if there is retained food debris in his esophagus at the time of surgery that we are unable to clear, then we will need to postpone his surgery.  Findings: On upper endoscopy, the esophagus was severely dilated and tortuous.  There was retained food debris within the esophagus and required irrigation and suctioning.  On laparoscopy, there were significant adhesions along the liver.  The stomach was tethered to the edge of the liver.  This was taken down meticulously.  There were also adhesions between the stomach and the diaphragm towards the hiatus.    Operative Technique: After the risks, benefits and alternatives were thoroughly discussed, the patient was brought  to the operative theatre.  Anesthesia was induced, and the esophagoscope was passed through the oropharynx down to the esophagus.  It was severely dilated and tortuous.  There was food debris that was irrigated and suctioned to clear completely prior to performing the myotomy.  Was difficult passing through the EG junction but once were able to intubate the stomach we parked the gastroscope in this position.   The patient was then prepped and draped in normal sterile fashion.  An appropriate surgical pause was performed, and pre-operative antibiotics were dosed accordingly.  We began with a 1 cm incision 15 cm caudad from the xiphoid and slightly lateral to the umbilicus.  Using an Optiview we entered the peritoneal space.  The abdomen was then insufflated with CO2.  3 other robotic ports were placed to triangulate the hiatus.  Another 8 mm port was placed in place at the level of the umbilicus laterally for an assistant port.    The robot was then docked, and instruments were advanced under direct visualization.  Great care was used to to take down all the adhesions in the upper abdomen.  The stomach was gently dissected away from the liver and the diaphragm.  Once we created after pain the robot was then undocked after removal of all instruments.  The patient was then placed in steep reverse Trendelenburg and the robot was redocked.  We began by dividing the gastrohepatic ligament to  expose the right diaphragmatic crus and then dissected the esophagus away from the crura.  Once we had a completely encircled then moved to the greater curve of the stomach to divide the short gastric vessels.  Once this was completed we then focused back on the esophagus and ensured that we had good intra-abdominal length by dissecting some of the esophagus up into the mediastinum.  Then using a combination of monopolar cautery and blunt dissection we created a myotomy by splitting the esophageal muscle.  We were able to  identify the mucosal surface of the esophagus and there was no apparent injury.  We performed a 6cm myotomy.  3 cm of which was along the stomach.    Next the stomach was passed anterior to the esophagus and Dor fundoplication was performed.  An air leak test was performed using the gastroscope.  No leak was evident.  We will also able to pass directly into the stomach without much obstruction.  All ports were removed under direct visualization.  The skin and soft tissue were closed with absorbable suture    The patient tolerated the procedure without any immediate complications, and was transferred to the PACU in stable condition.  Barclay Lennox Bary Leriche

## 2021-07-02 ENCOUNTER — Encounter (HOSPITAL_COMMUNITY): Payer: Self-pay | Admitting: Thoracic Surgery (Cardiothoracic Vascular Surgery)

## 2021-07-02 ENCOUNTER — Encounter (HOSPITAL_COMMUNITY)
Admission: RE | Disposition: A | Discharge status: Home / Self Care | Payer: Self-pay | Source: Ambulatory Visit | Attending: Thoracic Surgery (Cardiothoracic Vascular Surgery)

## 2021-07-02 ENCOUNTER — Inpatient Hospital Stay (HOSPITAL_COMMUNITY): Payer: Medicare Other

## 2021-07-02 ENCOUNTER — Encounter (HOSPITAL_COMMUNITY): Payer: Self-pay | Admitting: Certified Registered Nurse Anesthetist

## 2021-07-02 ENCOUNTER — Observation Stay (HOSPITAL_COMMUNITY): Payer: Medicare Other

## 2021-07-02 DIAGNOSIS — K220 Achalasia of cardia: Secondary | ICD-10-CM | POA: Diagnosis present

## 2021-07-02 DIAGNOSIS — Z833 Family history of diabetes mellitus: Secondary | ICD-10-CM | POA: Diagnosis not present

## 2021-07-02 DIAGNOSIS — K660 Peritoneal adhesions (postprocedural) (postinfection): Secondary | ICD-10-CM | POA: Diagnosis present

## 2021-07-02 DIAGNOSIS — Z8719 Personal history of other diseases of the digestive system: Secondary | ICD-10-CM | POA: Diagnosis not present

## 2021-07-02 DIAGNOSIS — Z8711 Personal history of peptic ulcer disease: Secondary | ICD-10-CM | POA: Diagnosis not present

## 2021-07-02 DIAGNOSIS — K222 Esophageal obstruction: Secondary | ICD-10-CM | POA: Diagnosis present

## 2021-07-02 DIAGNOSIS — Z800 Family history of malignant neoplasm of digestive organs: Secondary | ICD-10-CM | POA: Diagnosis not present

## 2021-07-02 DIAGNOSIS — Z20822 Contact with and (suspected) exposure to covid-19: Secondary | ICD-10-CM | POA: Diagnosis present

## 2021-07-02 DIAGNOSIS — J45909 Unspecified asthma, uncomplicated: Secondary | ICD-10-CM | POA: Diagnosis present

## 2021-07-02 DIAGNOSIS — Z87891 Personal history of nicotine dependence: Secondary | ICD-10-CM | POA: Diagnosis not present

## 2021-07-02 SURGERY — CREATION, JEJUNOSTOMY, LAPAROSCOPIC
Anesthesia: General

## 2021-07-02 MED ORDER — DIATRIZOATE MEGLUMINE & SODIUM 66-10 % PO SOLN
120.0000 mL | Freq: Once | ORAL | Status: DC
  Filled 2021-07-02: qty 120

## 2021-07-02 NOTE — Anesthesia Preprocedure Evaluation (Deleted)
Anesthesia Evaluation  Patient identified by MRN, date of birth, ID band Patient awake    Reviewed: Allergy & Precautions, NPO status , Patient's Chart, lab work & pertinent test results  Airway Mallampati: I  TM Distance: >3 FB Neck ROM: Full    Dental  (+) Edentulous Upper, Lower Dentures, Dental Advisory Given   Pulmonary asthma , COPD, former smoker,    breath sounds clear to auscultation       Cardiovascular negative cardio ROS   Rhythm:Regular Rate:Normal     Neuro/Psych negative neurological ROS  negative psych ROS   GI/Hepatic Neg liver ROS, hiatal hernia, PUD, GERD  Medicated,  Endo/Other  negative endocrine ROS  Renal/GU negative Renal ROS     Musculoskeletal negative musculoskeletal ROS (+)   Abdominal   Peds  Hematology negative hematology ROS (+)   Anesthesia Other Findings   Reproductive/Obstetrics                             Anesthesia Physical  Anesthesia Plan  ASA: 2  Anesthesia Plan: General   Post-op Pain Management:    Induction: Intravenous  PONV Risk Score and Plan: 3 and Ondansetron, Dexamethasone and Midazolam  Airway Management Planned: Oral ETT  Additional Equipment: None  Intra-op Plan:   Post-operative Plan: Extubation in OR  Informed Consent: I have reviewed the patients History and Physical, chart, labs and discussed the procedure including the risks, benefits and alternatives for the proposed anesthesia with the patient or authorized representative who has indicated his/her understanding and acceptance.     Dental advisory given  Plan Discussed with: CRNA  Anesthesia Plan Comments: (Pt declined surgery. )       Anesthesia Quick Evaluation
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
## 2021-07-02 NOTE — Progress Notes (Signed)
      [address]       [place] [address]             [phone]       Esophagram report reviewed and shows no evidence of perforation or leak.  There is however evidence of obstruction with contrast media not leaving the stomach which was felt to possibly be related to post operative swelling.  I spoke with Dr. Kipp Brood and at this time the patient is not being started on a diet.  CXR was obtained and shows contrast media present and KUB also obtained and doesn't show significant passing of media out of the stomach.  Ellwood Handler, PA-C

## 2021-07-02 NOTE — Progress Notes (Signed)
      [address]       [place] [address]             [phone]      1 Day Post-Op Procedure(s) (LRB): XI ROBOTIC ASSISTED HELLER MYOTOMY with fundoplication (N/A) ESOPHAGOGASTRODUODENOSCOPY (EGD) (N/A) Subjective: Feels okay this morning, pain is well controlled  Objective: Vital signs in last 24 hours: Temp:  [97 F (36.1 C)-98.2 F (36.8 C)] 98 F (36.7 C) (09/09 0344) Pulse Rate:  [67-100] 76 (09/09 0344) Cardiac Rhythm: Normal sinus rhythm (09/08 2021) Resp:  [11-20] 14 (09/09 0344) BP: (108-141)/(71-83) 108/77 (09/09 0344) SpO2:  [93 %-100 %] 94 % (09/09 0344) Arterial Line BP: (150-156)/(72-76) 156/76 (09/08 1645) FiO2 (%):  [21 %] 21 % (09/08 1623) Weight:  [53.5 kg] 53.5 kg (09/08 0937)     Intake/Output from previous day: 09/08 0701 - 09/09 0700 In: 3346 [I.V.:2646; IV Piggyback:700] Out: 505 [Urine:480; Blood:25] Intake/Output this shift: No intake/output data recorded.  General appearance: alert, cooperative, and no distress Heart: regular rate and rhythm, S1, S2 normal, no murmur, click, rub or gallop Lungs: clear to auscultation bilaterally Abdomen: soft, non-tender; bowel sounds normal; no masses,  no organomegaly Extremities: extremities normal, atraumatic, no cyanosis or edema Wound: clean and dry  Lab Results: Recent Labs    06/29/21 1000  WBC 7.8  HGB 11.1*  HCT 34.1*  PLT 388   BMET:  Recent Labs    06/29/21 1000  NA 133*  K 4.1  CL 105  CO2 21*  GLUCOSE 139*  BUN 5*  CREATININE 0.80  CALCIUM 9.1    PT/INR:  Recent Labs    06/29/21 1000  LABPROT 13.2  INR 1.0   ABG    Component Value Date/Time   PHART 7.396 06/29/2021 0955   HCO3 21.2 06/29/2021 0955   ACIDBASEDEF 2.8 (H) 06/29/2021 0955   O2SAT 97.0 06/29/2021 0955   CBG (last 3)  No results for input(s): GLUCAP in the last 72 hours.  Assessment/Plan: S/P Procedure(s) (LRB): XI ROBOTIC ASSISTED HELLER MYOTOMY with fundoplication  (N/A) ESOPHAGOGASTRODUODENOSCOPY (EGD) (N/A)  CV- NSR in the 70s, BP well controlled.  Pulm-tolerating room air with good oxygen saturation. CXR this morning showed: Small amount of pneumomediastinum adjacent to the left heart border. Subcutaneous emphysema in the left supraclavicular soft tissues is presumably postsurgical. No pneumo or large effusion.  GI- NPO, swallow study pending Renal-creatinine 0.80, electrolytes okay H and H 11.1/34.1  Plan: Esophogram this morning, if he tolerates he will be on a dysphagia 2 diet and a possible discharge later today.    LOS: 1 day    Elgie Collard 07/02/2021

## 2021-07-03 ENCOUNTER — Inpatient Hospital Stay (HOSPITAL_COMMUNITY): Payer: Medicare Other

## 2021-07-03 MED ORDER — ONDANSETRON 8 MG PO TBDP: 8.0000 mg | ORAL_TABLET | Freq: Three times a day (TID) | ORAL | 0 refills | Status: DC

## 2021-07-03 NOTE — Discharge Instructions (Signed)
CRUSH ALL MEDICATIONS FOR 1 WEEK, TAKE WITH APPLESAUCE  DIET- PUREED DIET (BABY FOOD CONSISTENCY) X 1 WEEK, DO NOT ADVANCE UNTIL CLEARED BY DR. Kipp Brood  ACTIVITY- UP AS TOLERATED, AVOID STRENUOUS LIFTING AND ACTIVITY  NO DRIVING FOR 1 WEEK  CONTACT OUR OFFICE AT [phone] IF ISSUES [place]

## 2021-07-03 NOTE — Progress Notes (Addendum)
      [address]       [place] [address]             [phone]      1 Day Post-Op Procedure(s) (LRB): LAPAROSCOPIC JEJUNOSTOMY (N/A)  Subjective:  Patient is upset this morning.  He states that no one woke up him up when his breakfast arrived.  He tolerated liquid diet last evening.  He again states he wishes to go home today.  I told patient we would give him a dysphagia diet this morning and if he tolerates that he may go home.  He states he isn't hungry and doesn't want to eat.  He states he hasn't had solid food in days and is just fine.  Adamant about wanting to leave.  Objective: Vital signs in last 24 hours: Temp:  [97.7 F (36.5 C)-98.6 F (37 C)] 97.9 F (36.6 C) (09/10 0544) Pulse Rate:  [70-73] 70 (09/10 0544) Cardiac Rhythm: Normal sinus rhythm (09/10 0752) Resp:  [15-20] 18 (09/10 0544) BP: (124-136)/(61-78) 126/75 (09/10 0544) SpO2:  [93 %-98 %] 94 % (09/10 0544)  Intake/Output from previous day: No intake/output data recorded. Intake/Output this shift: No intake/output data recorded.  General appearance: alert, cooperative, and upset, no distress Heart: regular rate and rhythm Lungs: clear to auscultation bilaterally Abdomen: soft, non-tender; bowel sounds normal; no masses,  no organomegaly Wound: clean and dry  Lab Results: No results for input(s): WBC, HGB, HCT, PLT in the last 72 hours. BMET: No results for input(s): NA, K, CL, CO2, GLUCOSE, BUN, CREATININE, CALCIUM in the last 72 hours.  PT/INR: No results for input(s): LABPROT, INR in the last 72 hours. ABG    Component Value Date/Time   PHART 7.396 06/29/2021 0955   HCO3 21.2 06/29/2021 0955   ACIDBASEDEF 2.8 (H) 06/29/2021 0955   O2SAT 97.0 06/29/2021 0955   CBG (last 3)  No results for input(s): GLUCAP in the last 72 hours.  Assessment/Plan: S/P Procedure(s) (LRB): LAPAROSCOPIC JEJUNOSTOMY (N/A)  S/P Heller Myotomy with Fundoplication- tolerated clear liquid diet  overnight, CXR shows no evidence of Barium in the stomach, this has moved since yesterdays film Diet- will order dysphagia diet, encouraged patient to try to eat something.. if he tolerates w/o issue he may be discharged home today   LOS: 2 days   Ellwood Handler, PA-C 07/03/2021   Chart reviewed, patient examined, agree with above. Barium seen in esophagus yesterday has all passed into stomach and small bowel. If he eats dysphagia diet he can go home.

## 2021-07-07 ENCOUNTER — Emergency Department (HOSPITAL_COMMUNITY)
Admission: EM | Admit: 2021-07-07 | Discharge: 2021-07-08 | Disposition: A | Discharge status: Home / Self Care | Payer: Medicare Other | Attending: Emergency Medicine | Admitting: Emergency Medicine

## 2021-07-07 ENCOUNTER — Encounter (HOSPITAL_COMMUNITY): Payer: Self-pay | Admitting: Emergency Medicine

## 2021-07-07 ENCOUNTER — Other Ambulatory Visit: Payer: Self-pay

## 2021-07-07 DIAGNOSIS — Z4801 Encounter for change or removal of surgical wound dressing: Secondary | ICD-10-CM | POA: Insufficient documentation

## 2021-07-07 DIAGNOSIS — Z5321 Procedure and treatment not carried out due to patient leaving prior to being seen by health care provider: Secondary | ICD-10-CM | POA: Diagnosis not present

## 2021-07-07 LAB — CBC WITH DIFFERENTIAL/PLATELET
Abs Immature Granulocytes: 0 10*3/uL (ref 0.00–0.07)
Basophils Absolute: 0.1 10*3/uL (ref 0.0–0.1)
Basophils Relative: 1 %
Eosinophils Absolute: 2.9 10*3/uL — ABNORMAL HIGH (ref 0.0–0.5)
Eosinophils Relative: 24 %
HCT: 33.5 % — ABNORMAL LOW (ref 39.0–52.0)
Hemoglobin: 11.3 g/dL — ABNORMAL LOW (ref 13.0–17.0)
Lymphocytes Relative: 17 %
Lymphs Abs: 2.1 10*3/uL (ref 0.7–4.0)
MCH: 28.8 pg (ref 26.0–34.0)
MCHC: 33.7 g/dL (ref 30.0–36.0)
MCV: 85.2 fL (ref 80.0–100.0)
Monocytes Absolute: 1.2 10*3/uL — ABNORMAL HIGH (ref 0.1–1.0)
Monocytes Relative: 10 %
Neutro Abs: 5.9 10*3/uL (ref 1.7–7.7)
Neutrophils Relative %: 48 %
Platelets: 415 10*3/uL — ABNORMAL HIGH (ref 150–400)
RBC: 3.93 MIL/uL — ABNORMAL LOW (ref 4.22–5.81)
RDW: 14.4 % (ref 11.5–15.5)
WBC: 12.2 10*3/uL — ABNORMAL HIGH (ref 4.0–10.5)
nRBC: 0 % (ref 0.0–0.2)

## 2021-07-07 LAB — COMPREHENSIVE METABOLIC PANEL
ALT: 16 U/L (ref 0–44)
AST: 24 U/L (ref 15–41)
Albumin: 3.8 g/dL (ref 3.5–5.0)
Alkaline Phosphatase: 66 U/L (ref 38–126)
Anion gap: 11 (ref 5–15)
BUN: 8 mg/dL (ref 8–23)
CO2: 23 mmol/L (ref 22–32)
Calcium: 9.5 mg/dL (ref 8.9–10.3)
Chloride: 95 mmol/L — ABNORMAL LOW (ref 98–111)
Creatinine, Ser: 0.81 mg/dL (ref 0.61–1.24)
GFR, Estimated: >60 mL/min (ref >60)
Glucose, Bld: 95 mg/dL (ref 70–99)
Potassium: 3.9 mmol/L (ref 3.5–5.1)
Sodium: 129 mmol/L — ABNORMAL LOW (ref 135–145)
Total Bilirubin: 0.4 mg/dL (ref 0.3–1.2)
Total Protein: 6.6 g/dL (ref 6.5–8.1)

## 2021-07-07 NOTE — ED Provider Notes (Addendum)
Emergency Medicine Provider Triage Evaluation Note  Raymond Jacobs , a 70 y.o. male  was evaluated in triage.  Pt complains of post-op wound infection and swelling. Underwent Esophagoscopy, Robotic assisted laparoscopy, Extensive lysis of adhesions complicated case by 123456, Heller myotomy, Dor Fundoplication with Dr. Kipp Brood on 07/01/21.  States incisions are starting to appear red and he has had some swelling along lower abdomen.  States today left lower abdomen has been more painful.  Denies fever. Has been urinating fine with normal BM.  Review of Systems  Positive: Wound infection, swelling Negative: fever  Physical Exam  BP 119/78 (BP Location: Right Arm)   Pulse 88   Temp 98 F (36.7 C) (Oral)   Resp 18   SpO2 95%  Gen:   Awake, no distress Resp:  Normal effort  MSK:   Moves extremities without difficulty  Other:  Surgical incisions along lower abdomen are slightly reddened with surrounding bruising, no purulent drainage, does appear to have some increased swelling on left side of lower abdomen and is mildly tender, no peritoneal signs  Medical Decision Making  Medically screening exam initiated at 10:06 PM.  Appropriate orders placed.  Raymond Jacobs was informed that the remainder of the evaluation will be completed by another provider, this initial triage assessment does not replace that evaluation, and the importance of remaining in the ED until their evaluation is complete.  Will obtain labs, CT to ensure to no post-op complication.   Larene Pickett, PA-C 07/07/21 2219    Larene Pickett, PA-C 07/07/21 2219    Truddie Hidden, MD 07/07/21 [phone]

## 2021-07-07 NOTE — ED Triage Notes (Signed)
Pt came into Triage stating he wanted to leave. PA convinced him to stay.  He had abdominal surgery last Friday and feels some swelling at the site.  No pain.

## 2021-07-08 LAB — PATHOLOGIST SMEAR REVIEW

## 2021-07-08 NOTE — ED Notes (Signed)
Patient cant be located in the lobby. Another patient states he told her he was leaving and walked out.

## 2021-07-09 ENCOUNTER — Ambulatory Visit (INDEPENDENT_AMBULATORY_CARE_PROVIDER_SITE_OTHER): Payer: Self-pay | Admitting: Thoracic Surgery (Cardiothoracic Vascular Surgery)

## 2021-07-09 ENCOUNTER — Other Ambulatory Visit: Payer: Self-pay

## 2021-07-09 DIAGNOSIS — Z9889 Other specified postprocedural states: Secondary | ICD-10-CM

## 2021-07-09 NOTE — Progress Notes (Signed)
     [address]       [place] [address]             [phone]       Patient: Home Provider: Office Consent for Telemedicine visit obtained.  Today's visit was completed via a real-time telehealth (see specific modality noted below). The patient/authorized person provided oral consent at the time of the visit to engage in a telemedicine encounter with the present provider at [facility]. The patient/authorized person was informed of the potential benefits, limitations, and risks of telemedicine. The patient/authorized person expressed understanding that the laws that protect confidentiality also apply to telemedicine. The patient/authorized person acknowledged understanding that telemedicine does not provide emergency services and that he or she would need to call 911 or proceed to the nearest hospital for help if such a need arose.   Total time spent in the clinical discussion 10 minutes.  Telehealth Modality: Phone visit (audio only)  I had a telephone visit with Raymond Jacobs.  He is status post robotic assisted Heller myotomy with Dor fundoplication.  Her he states that he is doing well has been tolerating soft mechanical diet without issue.  Food is getting down and not causing him any pain or getting stuck.  I recommended that he advance his diet to fork tender food.  He also no longer needs to crush his medications.  I will see him in 1 month with a chest x-ray.
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
## 2021-07-28 ENCOUNTER — Other Ambulatory Visit: Payer: Self-pay

## 2021-07-28 ENCOUNTER — Encounter: Payer: Self-pay | Admitting: Primary Care

## 2021-07-28 ENCOUNTER — Ambulatory Visit (INDEPENDENT_AMBULATORY_CARE_PROVIDER_SITE_OTHER): Payer: Medicare Other | Admitting: Primary Care

## 2021-07-28 VITALS — BP 104/60 | HR 77 | Temp 97.8°F | Ht 62.5 in | Wt 119.6 lb

## 2021-07-28 DIAGNOSIS — Z87891 Personal history of nicotine dependence: Secondary | ICD-10-CM | POA: Diagnosis not present

## 2021-07-28 DIAGNOSIS — Z23 Encounter for immunization: Secondary | ICD-10-CM

## 2021-07-28 DIAGNOSIS — J449 Chronic obstructive pulmonary disease, unspecified: Secondary | ICD-10-CM | POA: Diagnosis not present

## 2021-07-28 NOTE — Patient Instructions (Signed)
Nice seeing you today Raymond Jacobs, you looked well today  Recommendations: - Continue to stay active - Use albuterol rescue inhaler 2 puffs every 6 hours as needed for shortness of breath/wheezing (if needing > two times a day on average may consider adding back Advair) - You are due for influenza vaccine today   Office treatment: - High dose flu shot   Follow-up: - 6 months with Dr. Melonie [place]

## 2021-07-28 NOTE — Assessment & Plan Note (Addendum)
-   Quit smoking in November 2021, following with lung cancer screening clinic. LDCT in June 2022 showed LUNG RADS2, needs annual follow-up

## 2021-07-28 NOTE — Assessment & Plan Note (Addendum)
-   Stable interval; Minimally symptomatic. Maintained on PRN Albuterol, no longer on ICS/LAMA. Using SABA 1-2 times a day on average. CAT score 5. If symptoms progress recommend resuming Advair or Breo. Received high dose influenza vaccine today.

## 2021-08-05 ENCOUNTER — Other Ambulatory Visit: Payer: Self-pay | Admitting: Thoracic Surgery (Cardiothoracic Vascular Surgery)

## 2021-08-05 DIAGNOSIS — Z9889 Other specified postprocedural states: Secondary | ICD-10-CM

## 2021-08-06 ENCOUNTER — Encounter: Payer: Self-pay | Admitting: Thoracic Surgery (Cardiothoracic Vascular Surgery)

## 2021-08-28 ENCOUNTER — Other Ambulatory Visit: Payer: Self-pay | Admitting: Primary Care

## 2021-10-24 HISTORY — PX: MYOTOMY: SHX1013
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
## 2022-03-23 ENCOUNTER — Other Ambulatory Visit (INDEPENDENT_AMBULATORY_CARE_PROVIDER_SITE_OTHER): Payer: Medicare Other

## 2022-03-23 ENCOUNTER — Ambulatory Visit (INDEPENDENT_AMBULATORY_CARE_PROVIDER_SITE_OTHER): Payer: Medicare Other | Admitting: Nurse Practitioner

## 2022-03-23 ENCOUNTER — Encounter: Payer: Self-pay | Admitting: Nurse Practitioner

## 2022-03-23 VITALS — BP 112/60 | HR 80 | Ht 63.0 in | Wt 122.5 lb

## 2022-03-23 DIAGNOSIS — D500 Iron deficiency anemia secondary to blood loss (chronic): Secondary | ICD-10-CM | POA: Diagnosis not present

## 2022-03-23 DIAGNOSIS — R1013 Epigastric pain: Secondary | ICD-10-CM | POA: Diagnosis not present

## 2022-03-23 LAB — CBC WITH DIFFERENTIAL/PLATELET
Basophils Absolute: 0.3 10*3/uL — ABNORMAL HIGH (ref 0.0–0.1)
Basophils Relative: 4.5 % — ABNORMAL HIGH (ref 0.0–3.0)
Eosinophils Absolute: 0.3 10*3/uL (ref 0.0–0.7)
Eosinophils Relative: 4.2 % (ref 0.0–5.0)
HCT: 32 % — ABNORMAL LOW (ref 39.0–52.0)
Hemoglobin: 10.6 g/dL — ABNORMAL LOW (ref 13.0–17.0)
Lymphocytes Relative: 27.1 % (ref 12.0–46.0)
Lymphs Abs: 1.9 10*3/uL (ref 0.7–4.0)
MCHC: 33.1 g/dL (ref 30.0–36.0)
MCV: 86.2 fl (ref 78.0–100.0)
Monocytes Absolute: 0.7 10*3/uL (ref 0.1–1.0)
Monocytes Relative: 10.2 % (ref 3.0–12.0)
Neutro Abs: 3.8 10*3/uL (ref 1.4–7.7)
Neutrophils Relative %: 54 % (ref 43.0–77.0)
Platelets: 576 10*3/uL — ABNORMAL HIGH (ref 150.0–400.0)
RBC: 3.72 Mil/uL — ABNORMAL LOW (ref 4.22–5.81)
RDW: 14.3 % (ref 11.5–15.5)
WBC: 7 10*3/uL (ref 4.0–10.5)

## 2022-03-23 LAB — COMPREHENSIVE METABOLIC PANEL
ALT: 12 U/L (ref 0–53)
AST: 21 U/L (ref 0–37)
Albumin: 4 g/dL (ref 3.5–5.2)
Alkaline Phosphatase: 67 U/L (ref 39–117)
BUN: 10 mg/dL (ref 6–23)
CO2: 26 mEq/L (ref 19–32)
Calcium: 9.1 mg/dL (ref 8.4–10.5)
Chloride: 100 mEq/L (ref 96–112)
Creatinine, Ser: 0.78 mg/dL (ref 0.40–1.50)
GFR: 90.15 mL/min (ref >60.00)
Glucose, Bld: 99 mg/dL (ref 70–99)
Potassium: 4.7 mEq/L (ref 3.5–5.1)
Sodium: 133 mEq/L — ABNORMAL LOW (ref 135–145)
Total Bilirubin: 0.8 mg/dL (ref 0.2–1.2)
Total Protein: 6.8 g/dL (ref 6.0–8.3)

## 2022-03-23 MED ORDER — LANSOPRAZOLE 30 MG PO CPDR: 30.0000 mg | DELAYED_RELEASE_CAPSULE | Freq: Every day | ORAL | 1 refills | Status: AC

## 2022-03-23 NOTE — Patient Instructions (Addendum)
Your provider has requested that you go to the basement level for lab work before leaving today. Press "B" on the elevator. The lab is located at the first door on the left as you exit the elevator.  We have sent the following medications to your pharmacy for you to pick up at your convenience: lansoprazole.   Eat 4 small snack sized meals daily.   Call our office in 2 weeks with an symptom update.  The [facility] providers would like to encourage you to use [facility] to communicate with providers for non-urgent requests or questions.  Due to long hold times on the telephone, sending your provider a message by [facility] may be a faster and more efficient way to get a response.  Please allow 48 business hours for a response.  Please remember that this is for non-urgent requests.   Due to recent changes in healthcare laws, you may see the results of your imaging and laboratory studies on MyChart before your provider has had a chance to review them.  We understand that in some cases there may be results that are confusing or concerning to you. Not all laboratory results come back in the same time frame and the provider may be waiting for multiple results in order to interpret others.  Please give [place] 48 hours in order for your provider to thoroughly review all the results before contacting the office for clarification of your results.

## 2022-03-24 ENCOUNTER — Ambulatory Visit
Admission: RE | Admit: 2022-03-24 | Discharge: 2022-03-24 | Disposition: A | Discharge status: Home / Self Care | Payer: Medicare Other | Source: Ambulatory Visit | Attending: Acute Care | Admitting: Acute Care

## 2022-03-24 DIAGNOSIS — Z87891 Personal history of nicotine dependence: Secondary | ICD-10-CM

## 2022-03-30 ENCOUNTER — Other Ambulatory Visit: Payer: Self-pay | Admitting: Acute Care

## 2022-03-30 DIAGNOSIS — Z122 Encounter for screening for malignant neoplasm of respiratory organs: Secondary | ICD-10-CM

## 2022-03-30 DIAGNOSIS — Z87891 Personal history of nicotine dependence: Secondary | ICD-10-CM

## 2022-04-04 NOTE — Progress Notes (Signed)
Reviewed and agree with documentation and assessment and plan. K. Veena Tyisha Cressy , MD   

## 2022-05-12 ENCOUNTER — Other Ambulatory Visit: Payer: Self-pay | Admitting: Pulmonary Disease

## 2022-07-31 IMAGING — DX DG CHEST 1V PORT
1 series · 1 of 1 positions shown · non-contrast
Comparison: March 05, 2020

CLINICAL DATA: Difficulty breathing and cough.

EXAM:
PORTABLE CHEST 1 VIEW

[chest ap]
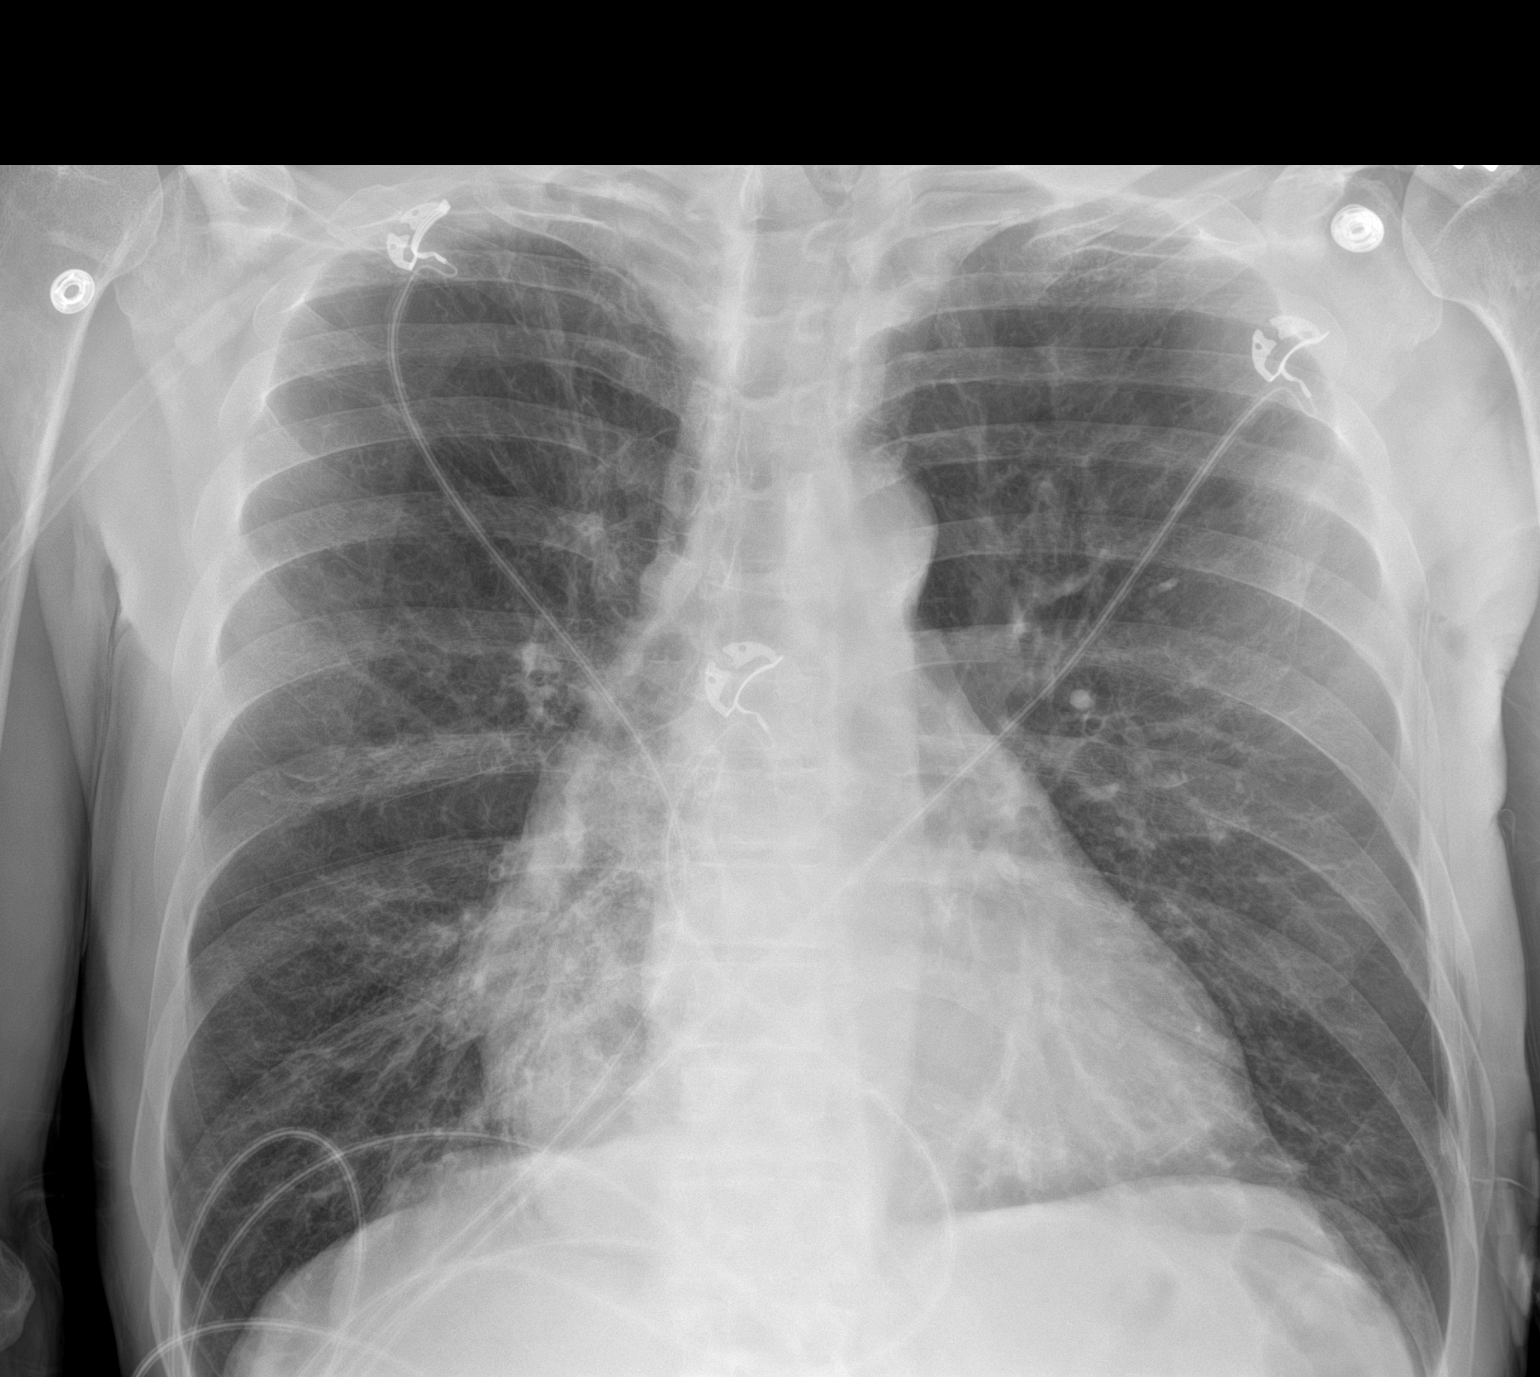

[1 of 1 positions shown; findings below may reference images not displayed]

FINDINGS: Stable chronic appearing increased interstitial lung markings are
seen. There is no evidence of acute infiltrate, pleural effusion or
pneumothorax. There is mild cardiomegaly. A persistent area of
lucency is seen overlying the retrocardiac region on the frontal
view. The visualized skeletal structures are unremarkable.
IMPRESSION: 1. Chronic appearing increased interstitial lung markings without
evidence of acute or active cardiopulmonary disease.
2. Additional findings consistent with known distal esophageal
dilatation.

## 2022-08-17 ENCOUNTER — Encounter: Payer: Self-pay | Admitting: Gastroenterology

## 2023-02-07 ENCOUNTER — Ambulatory Visit (INDEPENDENT_AMBULATORY_CARE_PROVIDER_SITE_OTHER): Payer: Medicare Other

## 2023-02-07 ENCOUNTER — Other Ambulatory Visit: Payer: Medicare Other

## 2023-02-07 ENCOUNTER — Ambulatory Visit (INDEPENDENT_AMBULATORY_CARE_PROVIDER_SITE_OTHER): Payer: Medicare Other | Admitting: Sports Medicine

## 2023-02-07 ENCOUNTER — Ambulatory Visit (INDEPENDENT_AMBULATORY_CARE_PROVIDER_SITE_OTHER): Payer: Medicare Other | Admitting: Orthopaedic Surgery

## 2023-02-07 ENCOUNTER — Encounter: Payer: Self-pay | Admitting: Orthopaedic Surgery

## 2023-02-07 DIAGNOSIS — M25512 Pain in left shoulder: Secondary | ICD-10-CM

## 2023-02-07 DIAGNOSIS — M25511 Pain in right shoulder: Secondary | ICD-10-CM | POA: Diagnosis not present

## 2023-02-07 MED ORDER — LIDOCAINE HCL 1 % IJ SOLN
2.0000 mL | INTRAMUSCULAR | Status: AC | PRN
  Administered 2023-02-07: 2 mL

## 2023-02-07 MED ORDER — TRAMADOL HCL 50 MG PO TABS: 50.0000 mg | ORAL_TABLET | Freq: Every day | ORAL | 0 refills | Status: AC | PRN

## 2023-02-07 MED ORDER — BUPIVACAINE HCL 0.25 % IJ SOLN
2.0000 mL | INTRAMUSCULAR | Status: AC | PRN
  Administered 2023-02-07: 2 mL via INTRA_ARTICULAR

## 2023-02-07 MED ORDER — METHYLPREDNISOLONE ACETATE 40 MG/ML IJ SUSP
80.0000 mg | INTRAMUSCULAR | Status: AC | PRN
Start: 2023-02-07 — End: 2023-02-07
  Administered 2023-02-07: 80 mg via INTRA_ARTICULAR

## 2023-02-07 NOTE — Progress Notes (Signed)
   Procedure Note  Patient: Raymond Jacobs             Date of Birth: [birth year]           MRN: [id number]             Visit Date: 02/07/2023  Procedures: Visit Diagnoses:  1. Acute pain of left shoulder    Large Joint Inj: L glenohumeral on 02/07/2023 11:19 AM Indications: pain Details: 22 G 3.5 in needle, ultrasound-guided posterior approach Medications: 2 mL lidocaine 1 %; 2 mL bupivacaine 0.25 %; 80 mg methylPREDNISolone acetate 40 MG/ML Outcome: tolerated well, no immediate complications  US-guided glenohumeral joint injection, left shoulder After discussion on risks/benefits/indications, informed verbal consent was obtained. A timeout was then performed. The patient was positioned lying lateral recumbent on examination table. The patient's shoulder was prepped with betadine and multiple alcohol swabs and utilizing ultrasound guidance, the patient's glenohumeral joint was identified on ultrasound. Using ultrasound guidance a 22-gauge, 3.5 inch needle with a mixture of 2:2:2 cc's lidocaine:bupivicaine:depomedrol was directed from a lateral to medial direction via in-plane technique into the glenohumeral joint with visualization of appropriate spread of injectate into the joint. Patient tolerated the procedure well without immediate complications.      Procedure, treatment alternatives, risks and benefits explained, specific risks discussed. Consent was given by the patient. Immediately prior to procedure a time out was called to verify the correct patient, procedure, equipment, support staff and site/side marked as required. Patient was prepped and draped in the usual sterile fashion.    - I evaluated the patient about 5 minutes post-injection and he had excellent improvement in pain and range of motion - he will f/u in 7-10 days if desires to have R-GHJ injection performed - otherwise, follow-up with Dr. Roda Shutters as indicated; I am happy to see them as needed  Madelyn Brunner, DO Primary  Care Sports Medicine Physician  [facility] - Orthopedics  This note was dictated using Dragon naturally speaking software and may contain errors in syntax, spelling, or content which have not been identified prior to signing this note.

## 2023-02-07 NOTE — Progress Notes (Signed)
Office Visit Note   Patient: Raymond Jacobs           Date of Birth: 1951-04-13           MRN: [id number] Visit Date: 02/07/2023              Requested by: No referring provider defined for this encounter. PCP: Pcp, No   Assessment & Plan: Visit Diagnoses:  1. Acute pain of both shoulders     Plan: Impression is 72 year old male with bilateral shoulder pain suspect rotator cuff syndrome.  Not much OA on xrays.  Will order outpatient PT for strengthening, GH injections with Dr. Shon Baton.  Tramadol for severe breakthrough pain.  Follow up if symptoms persist.  Follow-Up Instructions: No follow-ups on file.   Orders:  Orders Placed This Encounter  Procedures   XR Shoulder Left   XR Shoulder Right   Ambulatory referral to Physical Therapy   Meds ordered this encounter  Medications   traMADol (ULTRAM) 50 MG tablet    Sig: Take 1-2 tablets (50-100 mg total) by mouth daily as needed.    Dispense:  20 tablet    Refill:  0      Procedures: No procedures performed   Clinical Data: No additional findings.   Subjective: Chief Complaint  Patient presents with   Left Shoulder - Pain   Right Shoulder - Pain    HPI  Patient is a very pleasant 72 year old male who comes in for bilateral shoulder pain.  Very active and exercises regularly.  Feels like he may have injured them while exercising.  Denies radicular symptoms.  Had pain around the shoulders when raising his arms.  Has to shrug to compensate.    Review of Systems  Constitutional: Negative.   HENT: Negative.    Eyes: Negative.   Respiratory: Negative.    Cardiovascular: Negative.   Gastrointestinal: Negative.   Endocrine: Negative.   Genitourinary: Negative.   Skin: Negative.   Allergic/Immunologic: Negative.   Neurological: Negative.   Hematological: Negative.   Psychiatric/Behavioral: Negative.    All other systems reviewed and are negative.    Objective: Vital Signs: There were no vitals taken for  this visit.  Physical Exam Vitals and nursing note reviewed.  Constitutional:      Appearance: He is well-developed.  HENT:     Head: Normocephalic and atraumatic.  Eyes:     Pupils: Pupils are equal, round, and reactive to light.  Pulmonary:     Effort: Pulmonary effort is normal.  Abdominal:     Palpations: Abdomen is soft.  Musculoskeletal:        General: Normal range of motion.     Cervical back: Neck supple.  Skin:    General: Skin is warm.  Neurological:     Mental Status: He is alert and oriented to person, place, and time.  Psychiatric:        Behavior: Behavior normal.        Thought Content: Thought content normal.        Judgment: Judgment normal.     Ortho Exam  Examination of bilateral shoulders show mild glenohumeral crepitus with range of motion.  He has pain and weakness with manual muscle testing the supraspinatus and infraspinatus.  Neck exam is unremarkable.  Right he does shrug to compensate for shoulder abduction.  Specialty Comments:  No specialty comments available.  Imaging: No results found.   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   Food impaction of esophagus    Dysphagia    Former smoker 08/13/2018   Centrilobular emphysema 05/09/2018   Atelectasis    Perforated duodenal ulcer    Protein-calorie malnutrition, severe 03/31/2018   Abdominal pain 03/30/2018   Achalasia 08/30/2012   PUD (peptic ulcer disease) 08/15/2012   Past Medical History:  Diagnosis Date   Achalasia    Aortic atherosclerosis 04/08/2018   CT scan   Asthma    Duodenal ulcer    duodenal bulb   GERD (gastroesophageal reflux disease)    Hiatal hernia    Hyperplastic colon polyp    Pleural effusion    Stomach ulcer     Family History  Problem Relation Age of Onset    Colon cancer Brother    Diabetes Brother    Diabetes Maternal Aunt     Past Surgical History:  Procedure Laterality Date   BALLOON DILATION N/A 05/14/2019   Procedure: BALLOON DILATION;  Surgeon: Napoleon Form, MD;  Location: [facility];  Service: Endoscopy;  Laterality: N/A;  Rigiflex balloon dilation   BIOPSY  08/21/2018   Procedure: BIOPSY;  Surgeon: Beverley Fiedler, MD;  Location: [facility];  Service: Gastroenterology;;   BIOPSY  02/28/2021   Procedure: BIOPSY;  Surgeon: Shellia Cleverly, DO;  Location: [facility];  Service: Gastroenterology;;   BIOPSY  05/24/2021   Procedure: BIOPSY;  Surgeon: Napoleon Form, MD;  Location: [facility];  Service: Endoscopy;;   BOTOX INJECTION N/A 08/21/2018   Procedure: BOTOX INJECTION;  Surgeon: Beverley Fiedler, MD;  Location: [facility];  Service: Gastroenterology;  Laterality: N/A;   ESOPHAGOGASTRODUODENOSCOPY N/A 02/28/2021   Procedure: ESOPHAGOGASTRODUODENOSCOPY (EGD);  Surgeon: Shellia Cleverly, DO;  [facility];  Service: Gastroenterology;  Laterality: N/A;   ESOPHAGOGASTRODUODENOSCOPY N/A 05/24/2021   Procedure: ESOPHAGOGASTRODUODENOSCOPY (EGD);  Surgeon: Napoleon Form, MD;  Location: [facility];  Service: Endoscopy;  Laterality: N/A;   ESOPHAGOGASTRODUODENOSCOPY N/A 07/01/2021   Procedure: ESOPHAGOGASTRODUODENOSCOPY (EGD);  Surgeon: Corliss Skains, MD;  Location[facility] OR;  Service: Thoracic;  Laterality: N/A;   ESOPHAGOGASTRODUODENOSCOPY (EGD) WITH PROPOFOL N/A 08/21/2018   Procedure: ESOPHAGOGASTRODUODENOSCOPY (EGD) WITH PROPOFOL;  Surgeon: Beverley Fiedler, MD;  Location: [facility];  Service: Gastroenterology;  Laterality: N/A;   ESOPHAGOGASTRODUODENOSCOPY (EGD) WITH PROPOFOL N/A 05/14/2019   Procedure: ESOPHAGOGASTRODUODENOSCOPY (EGD) WITH PROPOFOL;  Surgeon: Napoleon Form, MD;  Location: [facility];  Service: Endoscopy;  Laterality: N/A;  pneumatic balloon dilation with Gastrografin 2  hours post procedure   FOREIGN BODY REMOVAL  05/14/2019   Procedure: FOREIGN BODY REMOVAL;  Surgeon: Napoleon Form, MD;  Location: [facility];  Service: Endoscopy;;   FOREIGN BODY REMOVAL  05/24/2021   Procedure: FOREIGN BODY REMOVAL;  Surgeon: Napoleon Form, MD;  Location: [facility];  Service: Endoscopy;;   IR RADIOLOGIST EVAL & MGMT  04/24/2018   MYOTOMY  2023   with Nissen fuldoplication   Social History   Occupational History   Not on file  Tobacco Use   Smoking status: Former    Packs/day: 1.25    Years: 44.00    Additional pack years: 0.00    Total pack years: 55.00    Types: Cigarettes    Start date: 66    Quit date: 08/2020    Years  since quitting: 2.4   Smokeless tobacco: Never  Vaping Use   Vaping Use: Never used  Substance and Sexual Activity   Alcohol use: Yes    Comment: 1 beer and 3 shots vodka daily   Drug use: Yes    Types: Marijuana   Sexual activity: Not on file

## 2023-02-14 ENCOUNTER — Ambulatory Visit (INDEPENDENT_AMBULATORY_CARE_PROVIDER_SITE_OTHER): Payer: Medicare Other | Admitting: Sports Medicine

## 2023-02-14 ENCOUNTER — Other Ambulatory Visit: Payer: Self-pay

## 2023-02-14 ENCOUNTER — Encounter: Payer: Self-pay | Admitting: Sports Medicine

## 2023-02-14 DIAGNOSIS — M25512 Pain in left shoulder: Secondary | ICD-10-CM | POA: Diagnosis not present

## 2023-02-14 DIAGNOSIS — M25511 Pain in right shoulder: Secondary | ICD-10-CM

## 2023-02-14 DIAGNOSIS — G8929 Other chronic pain: Secondary | ICD-10-CM

## 2023-02-14 MED ORDER — LIDOCAINE HCL 1 % IJ SOLN
2.0000 mL | INTRAMUSCULAR | Status: AC | PRN
  Administered 2023-02-14: 2 mL

## 2023-02-14 MED ORDER — METHYLPREDNISOLONE ACETATE 40 MG/ML IJ SUSP
80.0000 mg | INTRAMUSCULAR | Status: AC | PRN
  Administered 2023-02-14: 80 mg via INTRA_ARTICULAR

## 2023-02-14 MED ORDER — BUPIVACAINE HCL 0.25 % IJ SOLN
2.0000 mL | INTRAMUSCULAR | Status: AC | PRN
  Administered 2023-02-14: 2 mL via INTRA_ARTICULAR

## 2023-02-14 NOTE — Progress Notes (Signed)
   Office & Procedure Note  Patient: Raymond Jacobs             Date of Birth: 08-Oct-1951           MRN: [id number]             Visit Date: 02/14/2023  HPI: Raymond Jacobs is a very pleasant 72 year old male who presents today for his right shoulder pain.  A little over a week ago we did do an ultrasound-guided glenohumeral injection for the left shoulder and states this significantly helped his pain.  He has greatly improved range of motion and no pain.  He does have his first upcoming physical therapy appointment this Monday.  PE:   -There is full active and passive range of motion of bilateral shoulders.  There is some pain at endrange external rotation of the right shoulder with resisted ER there is some pain.  No gross weakness or instability.the shoulder.  Neurovascular intact distally.  Visit Diagnoses:  1. Chronic right shoulder pain   2. Chronic pain of both shoulders    Procedures:  Large Joint Inj: R glenohumeral on 02/14/2023 10:18 AM Indications: pain Details: 22 G 3.5 in needle, ultrasound-guided posterior approach Medications: 2 mL lidocaine 1 %; 2 mL bupivacaine 0.25 %; 80 mg methylPREDNISolone acetate 40 MG/ML Outcome: tolerated well, no immediate complications  US-guided glenohumeral joint injection, right shoulder After discussion on risks/benefits/indications, informed verbal consent was obtained. A timeout was then performed. The patient was positioned lying lateral recumbent on examination table. The patient's shoulder was prepped with betadine and multiple alcohol swabs and utilizing ultrasound guidance, the patient's glenohumeral joint was identified on ultrasound. Using ultrasound guidance a 22-gauge, 3.5 inch needle with a mixture of 2:2:2 cc's lidocaine:bupivicaine:depomedrol was directed from a lateral to medial direction via in-plane technique into the glenohumeral joint with visualization of appropriate spread of injectate into the joint. Patient tolerated the procedure  well without immediate complications.      Procedure, treatment alternatives, risks and benefits explained, specific risks discussed. Consent was given by the patient. Immediately prior to procedure a time out was called to verify the correct patient, procedure, equipment, support staff and site/side marked as required. Patient was prepped and draped in the usual sterile fashion.     Plan: -Through shared decision-making, did elect to proceed with ultrasound-guided right shoulder injection, patient tolerated well -Recommended modified activity for the next 48 hours, then he will get back into his usual physical activity -He will use ice and over-the-counter anti-inflammatories for any postinjection pain; he does have tramadol 50 mg to take as needed for breakthrough pain only -Begin physical therapy on Monday for both shoulders -Mention having knee pain, would like a separate appointment for this.  He may follow-up with me or Dr. Roda Shutters for this as desired  Madelyn Brunner, DO Primary Care Sports Medicine Physician  [facility] - Orthopedics  This note was dictated using Dragon naturally speaking software and may contain errors in syntax, spelling, or content which have not been identified prior to signing this note.

## 2023-02-16 ENCOUNTER — Ambulatory Visit (INDEPENDENT_AMBULATORY_CARE_PROVIDER_SITE_OTHER): Payer: Medicare Other

## 2023-02-16 ENCOUNTER — Ambulatory Visit (INDEPENDENT_AMBULATORY_CARE_PROVIDER_SITE_OTHER): Payer: Medicare Other | Admitting: Orthopaedic Surgery

## 2023-02-16 ENCOUNTER — Encounter: Payer: Self-pay | Admitting: Orthopaedic Surgery

## 2023-02-16 DIAGNOSIS — G8929 Other chronic pain: Secondary | ICD-10-CM

## 2023-02-16 DIAGNOSIS — M25562 Pain in left knee: Secondary | ICD-10-CM

## 2023-02-16 DIAGNOSIS — M25561 Pain in right knee: Secondary | ICD-10-CM

## 2023-02-16 NOTE — Progress Notes (Signed)
Office Visit Note   Patient: Raymond Jacobs           Date of Birth: 10-24-1951           MRN: [id number] Visit Date: 02/16/2023              Requested by: No referring provider defined for this encounter. PCP: Pcp, No   Assessment & Plan: Visit Diagnoses:  1. Chronic pain of both knees     Plan: Impression 72 year old gentleman with bilateral knee osteoarthritis.  Symptomatically this is mild and can still be treated with over-the-counter medications.  He will try Voltaren gel.  Continue knee compression sleeve.  Follow-up as needed.  Follow-Up Instructions: No follow-ups on file.   Orders:  Orders Placed This Encounter  Procedures   XR KNEE 3 VIEW LEFT   XR KNEE 3 VIEW RIGHT   No orders of the defined types were placed in this encounter.     Procedures: No procedures performed   Clinical Data: No additional findings.   Subjective: Chief Complaint  Patient presents with   Right Knee - Pain   Left Knee - Pain    HPI  Raymond Jacobs is a very pleasant 72 year old gentleman here for of occasional bilateral knee pain.  Takes Tylenol to manage his symptoms.  He took Advil in the past but is worried about GI side effects but never actually had any.  Review of Systems  Constitutional: Negative.   HENT: Negative.    Eyes: Negative.   Respiratory: Negative.    Cardiovascular: Negative.   Gastrointestinal: Negative.   Endocrine: Negative.   Genitourinary: Negative.   Skin: Negative.   Allergic/Immunologic: Negative.   Neurological: Negative.   Hematological: Negative.   Psychiatric/Behavioral: Negative.    All other systems reviewed and are negative.    Objective: Vital Signs: There were no vitals taken for this visit.  Physical Exam Vitals and nursing note reviewed.  Constitutional:      Appearance: He is well-developed.  Pulmonary:     Effort: Pulmonary effort is normal.  Abdominal:     Palpations: Abdomen is soft.  Skin:    General: Skin is warm.   Neurological:     Mental Status: He is alert and oriented to person, place, and time.  Psychiatric:        Behavior: Behavior normal.        Thought Content: Thought content normal.        Judgment: Judgment normal.     Ortho Exam  Examination of bilateral knees show no joint effusion.  Normal range of motion.  Collaterals and cruciates are stable.  No joint line tenderness.  Specialty Comments:  No specialty comments available.  Imaging: XR KNEE 3 VIEW LEFT  Result Date: 02/16/2023 X-rays demonstrate age-appropriate osteoarthritis with mild joint space narrowing.  XR KNEE 3 VIEW RIGHT  Result Date: 02/16/2023 X-rays demonstrate age-appropriate mild osteoarthritis.  Mild joint space narrowing.    PMFS History: Patient Active Problem List   Diagnosis Date Noted   Chronic pain of both knees 02/16/2023   S/P laparoscopic fundoplication 07/01/2021   Food bolus obstruction of intestine    Gastritis and gastroduodenitis    Duodenal ulcer    Asthma with COPD 08/11/2020   Food impaction of esophagus    Dysphagia    Former smoker 08/13/2018   Centrilobular emphysema 05/09/2018   Atelectasis    Perforated duodenal ulcer    Protein-calorie malnutrition, severe 03/31/2018   Abdominal pain 03/30/2018  Achalasia 08/30/2012   PUD (peptic ulcer disease) 08/15/2012   Past Medical History:  Diagnosis Date   Achalasia    Aortic atherosclerosis 04/08/2018   CT scan   Asthma    Duodenal ulcer    duodenal bulb   GERD (gastroesophageal reflux disease)    Hiatal hernia    Hyperplastic colon polyp    Pleural effusion    Stomach ulcer     Family History  Problem Relation Age of Onset   Colon cancer Brother    Diabetes Brother    Diabetes Maternal Aunt     Past Surgical History:  Procedure Laterality Date   BALLOON DILATION N/A 05/14/2019   Procedure: BALLOON DILATION;  Surgeon: Napoleon Form, MD;  Location: [facility];  Service: Endoscopy;  Laterality: N/A;   Rigiflex balloon dilation   BIOPSY  08/21/2018   Procedure: BIOPSY;  Surgeon: Beverley Fiedler, MD;  Location: [facility];  Service: Gastroenterology;;   BIOPSY  02/28/2021   Procedure: BIOPSY;  Surgeon: Shellia Cleverly, DO;  Location: [facility];  Service: Gastroenterology;;   BIOPSY  05/24/2021   Procedure: BIOPSY;  Surgeon: Napoleon Form, MD;  Location: [facility];  Service: Endoscopy;;   BOTOX INJECTION N/A 08/21/2018   Procedure: BOTOX INJECTION;  Surgeon: Beverley Fiedler, MD;  Location: [facility];  Service: Gastroenterology;  Laterality: N/A;   ESOPHAGOGASTRODUODENOSCOPY N/A 02/28/2021   Procedure: ESOPHAGOGASTRODUODENOSCOPY (EGD);  Surgeon: Shellia Cleverly, DO;  Location[facility];  Service: Gastroenterology;  Laterality: N/A;   ESOPHAGOGASTRODUODENOSCOPY N/A 05/24/2021   Procedure: ESOPHAGOGASTRODUODENOSCOPY (EGD);  Surgeon: Napoleon Form, MD;  Location: [facility];  Service: Endoscopy;  Laterality: N/A;   ESOPHAGOGASTRODUODENOSCOPY N/A 07/01/2021   Procedure: ESOPHAGOGASTRODUODENOSCOPY (EGD);  Surgeon: Corliss Skains, MD;  Location: [facility] OR;  Service: Thoracic;  Laterality: N/A;   ESOPHAGOGASTRODUODENOSCOPY (EGD) WITH PROPOFOL N/A 08/21/2018   Procedure: ESOPHAGOGASTRODUODENOSCOPY (EGD) WITH PROPOFOL;  Surgeon: Beverley Fiedler, MD;  Location: [facility];  Service: Gastroenterology;  Laterality: N/A;   ESOPHAGOGASTRODUODENOSCOPY (EGD) WITH PROPOFOL N/A 05/14/2019   Procedure: ESOPHAGOGASTRODUODENOSCOPY (EGD) WITH PROPOFOL;  Surgeon: Napoleon Form, MD;  Location: [facility];  Service: Endoscopy;  Laterality: N/A;  pneumatic balloon dilation with Gastrografin 2 hours post procedure   FOREIGN BODY REMOVAL  05/14/2019   Procedure: FOREIGN BODY REMOVAL;  Surgeon: Napoleon Form, MD;  Location: [facility];  Service: Endoscopy;;   FOREIGN BODY REMOVAL  05/24/2021   Procedure: FOREIGN BODY REMOVAL;  Surgeon: Napoleon Form, MD;  Location: [facility];  Service: Endoscopy;;   IR RADIOLOGIST EVAL & MGMT  04/24/2018   MYOTOMY  2023   with Nissen fuldoplication   Social History   Occupational History   Not on file  Tobacco Use   Smoking status: Former    Packs/day: 1.25    Years: 44.00    Additional pack years: 0.00    Total pack years: 55.00    Types: Cigarettes    Start date: 10    Quit date: 08/2020    Years since quitting: 2.4   Smokeless tobacco: Never  Vaping Use   Vaping Use: Never used  Substance and Sexual Activity   Alcohol use: Yes    Comment: 1 beer and 3 shots vodka daily   Drug use: Yes    Types: Marijuana   Sexual activity: Not on file

## 2023-02-22 ENCOUNTER — Encounter: Payer: Self-pay | Admitting: Physical Therapy

## 2023-02-22 ENCOUNTER — Ambulatory Visit (INDEPENDENT_AMBULATORY_CARE_PROVIDER_SITE_OTHER): Payer: Medicare Other | Admitting: Physical Therapy

## 2023-02-22 ENCOUNTER — Other Ambulatory Visit: Payer: Self-pay

## 2023-02-22 DIAGNOSIS — R293 Abnormal posture: Secondary | ICD-10-CM | POA: Diagnosis not present

## 2023-02-22 DIAGNOSIS — M25612 Stiffness of left shoulder, not elsewhere classified: Secondary | ICD-10-CM | POA: Diagnosis not present

## 2023-02-22 DIAGNOSIS — M25611 Stiffness of right shoulder, not elsewhere classified: Secondary | ICD-10-CM

## 2023-02-22 NOTE — Therapy (Signed)
OUTPATIENT PHYSICAL THERAPY SHOULDER EVALUATION   Patient Name: Raymond Jacobs MRN: [id number] DOB:07/19/1951, 72 y.o., male Today's Date: 02/22/2023  END OF SESSION:  PT End of Session - 02/22/23 0840     Visit Number 1    Number of Visits 1    Authorization Type MCR    Authorization Time Period 02/22/23 to 02/22/23    PT Start Time 0807   pt a few minutes late   PT Stop Time 0833   no skilled PT services indicated   PT Time Calculation (min) 26 min    Activity Tolerance Patient tolerated treatment well    Behavior During Therapy [facility] for tasks assessed/performed             Past Medical History:  Diagnosis Date   Achalasia    Aortic atherosclerosis (HCC) 04/08/2018   CT scan   Asthma    Duodenal ulcer    duodenal bulb   GERD (gastroesophageal reflux disease)    Hiatal hernia    Hyperplastic colon polyp    Pleural effusion    Stomach ulcer    Past Surgical History:  Procedure Laterality Date   BALLOON DILATION N/A 05/14/2019   Procedure: BALLOON DILATION;  Surgeon: Napoleon Form, MD;  Location: [facility];  Service: Endoscopy;  Laterality: N/A;  Rigiflex balloon dilation   BIOPSY  08/21/2018   Procedure: BIOPSY;  Surgeon: Beverley Fiedler, MD;  Location: [facility];  Service: Gastroenterology;;   BIOPSY  02/28/2021   Procedure: BIOPSY;  Surgeon: Shellia Cleverly, DO;  Location: [facility];  Service: Gastroenterology;;   BIOPSY  05/24/2021   Procedure: BIOPSY;  Surgeon: Napoleon Form, MD;  Location: [facility];  Service: Endoscopy;;   BOTOX INJECTION N/A 08/21/2018   Procedure: BOTOX INJECTION;  Surgeon: Beverley Fiedler, MD;  Location: [facility];  Service: Gastroenterology;  Laterality: N/A;   ESOPHAGOGASTRODUODENOSCOPY N/A 02/28/2021   Procedure: ESOPHAGOGASTRODUODENOSCOPY (EGD);  Surgeon: Shellia Cleverly, DO;  Location: [facility] ENDOSCOPY;  Service: Gastroenterology;  Laterality: N/A;   ESOPHAGOGASTRODUODENOSCOPY N/A 05/24/2021   Procedure:  ESOPHAGOGASTRODUODENOSCOPY (EGD);  Surgeon: Napoleon Form, MD;  Location: [facility];  Service: Endoscopy;  Laterality: N/A;   ESOPHAGOGASTRODUODENOSCOPY N/A 07/01/2021   Procedure: ESOPHAGOGASTRODUODENOSCOPY (EGD);  Surgeon: Corliss Skains, MD;  Location: [facility] OR;  Service: Thoracic;  Laterality: N/A;   ESOPHAGOGASTRODUODENOSCOPY (EGD) WITH PROPOFOL N/A 08/21/2018   Procedure: ESOPHAGOGASTRODUODENOSCOPY (EGD) WITH PROPOFOL;  Surgeon: Beverley Fiedler, MD;  Location: [facility];  Service: Gastroenterology;  Laterality: N/A;   ESOPHAGOGASTRODUODENOSCOPY (EGD) WITH PROPOFOL N/A 05/14/2019   Procedure: ESOPHAGOGASTRODUODENOSCOPY (EGD) WITH PROPOFOL;  Surgeon: Napoleon Form, MD;  Location: [facility];  Service: Endoscopy;  Laterality: N/A;  pneumatic balloon dilation with Gastrografin 2 hours post procedure   FOREIGN BODY REMOVAL  05/14/2019   Procedure: FOREIGN BODY REMOVAL;  Surgeon: Napoleon Form, MD;  Location: [facility];  Service: Endoscopy;;   FOREIGN BODY REMOVAL  05/24/2021   Procedure: FOREIGN BODY REMOVAL;  Surgeon: Napoleon Form, MD;  Location: [facility];  Service: Endoscopy;;   IR RADIOLOGIST EVAL & MGMT  04/24/2018   MYOTOMY  2023   with Nissen fuldoplication   Patient Active Problem List   Diagnosis Date Noted   Chronic pain of both knees 02/16/2023   S/P laparoscopic fundoplication 07/01/2021   Food bolus obstruction of intestine (HCC)    Gastritis and gastroduodenitis    Duodenal ulcer    Asthma with COPD 08/11/2020   Food impaction of  esophagus    Dysphagia    Former smoker 08/13/2018   Centrilobular emphysema (HCC) 05/09/2018   Atelectasis    Perforated duodenal ulcer (HCC)    Protein-calorie malnutrition, severe 03/31/2018   Abdominal pain 03/30/2018   Achalasia 08/30/2012   PUD (peptic ulcer disease) 08/15/2012    PCP: None   REFERRING PROVIDER: Tarry Kos, MD  REFERRING DIAG: M25.511,M25.512 (ICD-10-CM) - Acute pain of  both shoulders  THERAPY DIAG:  Stiffness of left shoulder, not elsewhere classified  Stiffness of right shoulder, not elsewhere classified  Abnormal posture  Rationale for Evaluation and Treatment: Rehabilitation  ONSET DATE: 02/07/2023  SUBJECTIVE:                                                                                                                                                                                      SUBJECTIVE STATEMENT: "My rotator cups, they gave me a shot here and a shot here". I feel better. Shots were a week ago and a week before that. Want to get back into exercise, like skip and jump roping, hand weights, ebike, etc. (Demonstrated some exercises that he feels "messed up my shoulders, I invented them sort of, I mean I didn't invent them but I chose those out out of all the possibilities")   Hand dominance: Ambidextrous  PERTINENT HISTORY: HPI   Patient is a very pleasant 73 year old male who comes in for bilateral shoulder pain.  Very active and exercises regularly.  Feels like he may have injured them while exercising.  Denies radicular symptoms.  Had pain around the shoulders when raising his arms.  Has to shrug to compensate.    PAIN:  Are you having pain? Yes: NPRS scale: 1/10 Pain location: B shoulders  Pain description: "internal perhaps I can tell you where it was but I can't describe it"  Aggravating factors: nothing  Relieving factors: shots, not over working shoulders   PRECAUTIONS: None  WEIGHT BEARING RESTRICTIONS: No  FALLS:  Has patient fallen in last 6 months? No  LIVING ENVIRONMENT: Lives with: lives alone Lives in: House/apartment Stairs: "I have stairs but they don't bother me I like stairs"  Has following equipment at home: None  OCCUPATION: Retired   PLOF: Independent, Independent with basic ADLs, Independent with gait, and Independent with transfers  PATIENT GOALS:mobility   NEXT MD VISIT:   OBJECTIVE:    DIAGNOSTIC FINDINGS:    PATIENT SURVEYS:    COGNITION: Overall cognitive status: Within functional limits for tasks assessed     SENSATION: Not tested  POSTURE: Flat L spine, significant thoracic kyphosis, rounded shoulders, forward head  UPPER EXTREMITY ROM:  Active ROM Right eval Left eval  Shoulder flexion 142* 152*  Shoulder extension    Shoulder abduction 160* 160*  Shoulder adduction    Shoulder internal rotation FIR T12  FIR T12   Shoulder external rotation FER T4 FER T3   Elbow flexion    Elbow extension    Wrist flexion    Wrist extension    Wrist ulnar deviation    Wrist radial deviation    Wrist pronation    Wrist supination    (Blank rows = not tested)  UPPER EXTREMITY MMT:  MMT Right eval Left eval  Shoulder flexion 4+ 4+  Shoulder extension 4 4  Shoulder abduction 4+ 4+  Shoulder adduction    Shoulder internal rotation 4+ 4+  Shoulder external rotation 4+ 4+  Middle trapezius 4+ 4+  Lower trapezius 2 2  Elbow flexion    Elbow extension    Wrist flexion    Wrist extension    Wrist ulnar deviation    Wrist radial deviation    Wrist pronation    Wrist supination    Grip strength (lbs)    (Blank rows = not tested)  SHOULDER SPECIAL TESTS:   JOINT MOBILITY TESTING:    PALPATION:     TODAY'S TREATMENT:                                                                                                                                         DATE:   Eval- objective measures/appropriate education and POC    PATIENT EDUCATION: Education details: exam findings, POC- seems shoulder pain and problems have resolved with all measures being in functional ranges and pain resolved, no need to return to PT now but if pain returns with exercise would certainly get new PT referral and return for further workup  Person educated: Patient Education method: Explanation and Demonstration Education comprehension: verbalized understanding, returned  demonstration, and needs further education  HOME EXERCISE PROGRAM: None   ASSESSMENT:  CLINICAL IMPRESSION: Patient is a 72 y.o. M who was seen today for physical therapy evaluation and treatment for B shoulder pain. Per his report, B shoulder shots relieved his pain and he feels he is at 95% at this point. Objective testing was WNL with ROM and MMT, main limitation was mild strength deficit as well as postural limitations but he seems very functional and close to baseline per his report. His subjective history was a bit difficult to follow at times especially when he was describing his exercise routine, also he was [facility] which somewhat limited answers to PT interview questions. At this time do not feel he is in need of skilled PT services, he plans to return to regular exercise routine soon- if pain returns, he will obtain new MD referral and return.   OBJECTIVE IMPAIRMENTS: decreased ROM, decreased strength, and postural dysfunction.   ACTIVITY LIMITATIONS: carrying and lifting  PARTICIPATION  LIMITATIONS: community activity and yard work  PERSONAL FACTORS: Age, Behavior pattern, Education, Fitness, Past/current experiences, and Time since onset of injury/illness/exacerbation are also affecting patient's functional outcome.   REHAB POTENTIAL: Good  CLINICAL DECISION MAKING: Stable/uncomplicated  EVALUATION COMPLEXITY: Low   GOALS: Goals reviewed with patient? No  SHORT TERM GOALS: Target date: 02/22/23    To have completed PT evaluation/be compliant with recommended POC to return if pain comes back with exercise  Baseline: Goal status: MET   LONG TERM GOALS: Target date: no LTGs appropriate     PLAN:  PT FREQUENCY: one time visit  PT DURATION: other: 1 time visit   PLANNED INTERVENTIONS: Patient/Family education and Re-evaluation  PLAN FOR NEXT SESSION: no need to return to PT services now, eval only. He will return to personal exercise program, and if pain returns  obtain new referral for PT   Nedra Hai PT DPT PN2  02/22/2023, 8:41 AM

## 2023-03-30 ENCOUNTER — Ambulatory Visit (HOSPITAL_COMMUNITY)
Admission: RE | Admit: 2023-03-30 | Discharge: 2023-03-30 | Disposition: A | Discharge status: Home / Self Care | Payer: Medicare Other | Source: Ambulatory Visit | Attending: Acute Care | Admitting: Acute Care

## 2023-03-30 DIAGNOSIS — Z87891 Personal history of nicotine dependence: Secondary | ICD-10-CM

## 2023-03-30 DIAGNOSIS — Z122 Encounter for screening for malignant neoplasm of respiratory organs: Secondary | ICD-10-CM

## 2023-04-06 ENCOUNTER — Telehealth: Payer: Self-pay | Admitting: Acute Care

## 2023-04-06 NOTE — Telephone Encounter (Signed)
Tiffany is calling with call report for patient.  CB# [phone]

## 2023-04-07 ENCOUNTER — Telehealth: Payer: Self-pay | Admitting: Acute Care

## 2023-04-07 NOTE — Telephone Encounter (Signed)
IMPRESSION: 1. New nodules are present, largest of which in the left lower lobe is categorized as Lung-RADS 4AS, suspicious. Follow up low-dose chest CT without contrast in 3 months (please use the following order, "CT CHEST LCS NODULE FOLLOW-UP W/O CM") is recommended. Alternatively, PET may be considered when there is a solid component 8mm or larger. 2. The "S" modifier above refers to potentially clinically significant non lung cancer related findings. Specifically, there is aortic atherosclerosis, in addition to coronary artery disease. Please note that although the presence of coronary artery calcium documents the presence of coronary artery disease, the severity of this disease and any potential stenosis cannot be assessed on this non-gated CT examination. Assessment for potential risk factor modification, dietary therapy or pharmacologic therapy may be warranted, if clinically indicated. 3. Mild diffuse bronchial wall thickening with moderate to severe centrilobular and paraseptal emphysema; imaging findings suggestive of underlying COPD. 4. Achalasia.

## 2023-04-07 NOTE — Telephone Encounter (Signed)
This is an ugly scan but too small to PET. This is a new 7.3 m nodule with irregular margins. I will do a 3 month follow up , but am considering doing Biodesix also. What do you think?

## 2023-04-10 NOTE — Telephone Encounter (Signed)
Called and left VM for pt

## 2023-04-11 ENCOUNTER — Encounter (HOSPITAL_COMMUNITY): Payer: Self-pay | Admitting: *Deleted

## 2023-04-11 ENCOUNTER — Other Ambulatory Visit: Payer: Self-pay

## 2023-04-11 ENCOUNTER — Ambulatory Visit (HOSPITAL_COMMUNITY)
Admission: EM | Admit: 2023-04-11 | Discharge: 2023-04-11 | Disposition: A | Discharge status: Home / Self Care | Payer: Medicare Other | Attending: Emergency Medicine | Admitting: Emergency Medicine

## 2023-04-11 DIAGNOSIS — H6123 Impacted cerumen, bilateral: Secondary | ICD-10-CM

## 2023-04-11 MED ORDER — CARBAMIDE PEROXIDE 6.5 % OT SOLN: 5.0000 [drp] | Freq: Two times a day (BID) | OTIC | 0 refills | Status: AC

## 2023-04-11 NOTE — Discharge Instructions (Addendum)
You had wax obstructing both of your canals.  Our staff irrigated and cleaned out your ears.  There are no signs of infection.  I believe your diminished hearing and ear pain was due to the extensive wax buildup.  He continues the wax softening drops twice daily to help soften earwax.  You can also considering doing a little bit of unscented hand sanitizer to your pinky, and applying to your external ear canal.  Please refrain from using Q-tips, as these commonly pushed the wax further back into your canal.  Please return to clinic for any new or concerning symptoms.

## 2023-04-11 NOTE — ED Triage Notes (Signed)
Pt reports recent loss of hearing . Pt reportds wax build up.

## 2023-04-11 NOTE — ED Provider Notes (Signed)
[facility]    CSN: [id number] Arrival date & time: 04/11/23  4098      History   Chief Complaint Chief Complaint  Patient presents with   decreased hearing    HPI ABDIAZIZ KRETZER is a 72 y.o. male.   Patient presents to clinic for diminished hearing, specifically in his left ear.  He has been using an over-the-counter eardrop for earaches the past 3 days, but his pain and hearing have progressively been getting worse.  Does have some pain to the left ear.  Reports his hearing is normally great at baseline.  Denies any fevers or ear drainage.  Endorses bilateral ear wax buildup.    The history is provided by the patient and medical records.    Past Medical History:  Diagnosis Date   Achalasia    Aortic atherosclerosis (HCC) 04/08/2018   CT scan   Asthma    Duodenal ulcer    duodenal bulb   GERD (gastroesophageal reflux disease)    Hiatal hernia    Hyperplastic colon polyp    Pleural effusion    Stomach ulcer     Patient Active Problem List   Diagnosis Date Noted   Chronic pain of both knees 02/16/2023   S/P laparoscopic fundoplication 07/01/2021   Food bolus obstruction of intestine (HCC)    Gastritis and gastroduodenitis    Duodenal ulcer    Asthma with COPD 08/11/2020   Food impaction of esophagus    Dysphagia    Former smoker 08/13/2018   Centrilobular emphysema (HCC) 05/09/2018   Atelectasis    Perforated duodenal ulcer (HCC)    Protein-calorie malnutrition, severe 03/31/2018   Abdominal pain 03/30/2018   Achalasia 08/30/2012   PUD (peptic ulcer disease) 08/15/2012    Past Surgical History:  Procedure Laterality Date   BALLOON DILATION N/A 05/14/2019   Procedure: BALLOON DILATION;  Surgeon: Napoleon Form, MD;  Location: [facility];  Service: Endoscopy;  Laterality: N/A;  Rigiflex balloon dilation   BIOPSY  08/21/2018   Procedure: BIOPSY;  Surgeon: Beverley Fiedler, MD;  Location: [facility];  Service: Gastroenterology;;    BIOPSY  02/28/2021   Procedure: BIOPSY;  Surgeon: Shellia Cleverly, DO;  Location: [facility];  Service: Gastroenterology;;   BIOPSY  05/24/2021   Procedure: BIOPSY;  Surgeon: Napoleon Form, MD;  Location: [facility];  Service: Endoscopy;;   BOTOX INJECTION N/A 08/21/2018   Procedure: BOTOX INJECTION;  Surgeon: Beverley Fiedler, MD;  [facility];  Service: Gastroenterology;  Laterality: N/A;   ESOPHAGOGASTRODUODENOSCOPY N/A 02/28/2021   Procedure: ESOPHAGOGASTRODUODENOSCOPY (EGD);  Surgeon: Shellia Cleverly, DO;  Location: [facility];  Service: Gastroenterology;  Laterality: N/A;   ESOPHAGOGASTRODUODENOSCOPY N/A 05/24/2021   Procedure: ESOPHAGOGASTRODUODENOSCOPY (EGD);  Surgeon: Napoleon Form, MD;  Location: [facility];  Service: Endoscopy;  Laterality: N/A;   ESOPHAGOGASTRODUODENOSCOPY N/A 07/01/2021   Procedure: ESOPHAGOGASTRODUODENOSCOPY (EGD);  Surgeon: Corliss Skains, MD;  Location: [facility] OR;  Service: Thoracic;  Laterality: N/A;   ESOPHAGOGASTRODUODENOSCOPY (EGD) WITH PROPOFOL N/A 08/21/2018   Procedure: ESOPHAGOGASTRODUODENOSCOPY (EGD) WITH PROPOFOL;  Surgeon: Beverley Fiedler, MD;  [facility];  Service: Gastroenterology;  Laterality: N/A;   ESOPHAGOGASTRODUODENOSCOPY (EGD) WITH PROPOFOL N/A 05/14/2019   Procedure: ESOPHAGOGASTRODUODENOSCOPY (EGD) WITH PROPOFOL;  Surgeon: Napoleon Form, MD;  [facility];  Service: Endoscopy;  Laterality: N/A;  pneumatic balloon dilation with Gastrografin 2 hours post procedure   FOREIGN BODY REMOVAL  05/14/2019   Procedure: FOREIGN BODY REMOVAL;  Surgeon:  Napoleon Form, MD;  [facility];  Service: Endoscopy;;   FOREIGN BODY REMOVAL  05/24/2021   Procedure: FOREIGN BODY REMOVAL;  Surgeon: Napoleon Form, MD;  [facility]: [facility];  Service: Endoscopy;;   IR RADIOLOGIST EVAL & MGMT  04/24/2018   MYOTOMY  2023   with Nissen fuldoplication       Home Medications     Prior to Admission medications   Medication Sig Start Date End Date Taking? Authorizing Provider  acetaminophen (TYLENOL) 500 MG tablet Take 1,000 mg by mouth every 6 (six) hours as needed for mild pain or headache.   Yes [provider]  albuterol (VENTOLIN HFA) 108 (90 Base) MCG/ACT inhaler INHALE 2 PUFFS BY MOUTH EVERY 4 HOURS AS NEEDED FOR WHEEZING OR SHORTNESS OF BREATH 05/12/22  Yes Oretha Milch, MD  Ascorbic Acid (VITAMIN C) 500 MG CAPS Take 500 mg by mouth 3 (three) times a week.   Yes [provider]  B Complex-C-Folic Acid (SUPER B-COMPLEX/VIT C/FA) TABS Take 1 tablet by mouth 4 (four) times a week.   Yes [provider]  carbamide peroxide (DEBROX) 6.5 % OTIC solution Place 5 drops into both ears 2 (two) times daily. 04/11/23  Yes Rinaldo Ratel, [place] N, FNP  Cholecalciferol (VITAMIN D3) 125 MCG (5000 UT) CAPS Take 5,000 Units by mouth 4 (four) times a week.   Yes [provider]  Cyanocobalamin (VITAMIN B12 PO) Take 1 tablet by mouth 4 (four) times a week.   Yes [provider]  ECHINACEA PO Take 1 tablet by mouth 4 (four) times a week.   Yes [provider]  famotidine (PEPCID) 20 MG tablet Take 20 mg by mouth 2 (two) times daily.   Yes [provider]  ferrous sulfate 325 (65 FE) MG tablet Take 325 mg by mouth 3 (three) times a week.    Yes [provider]  lansoprazole (PREVACID) 30 MG capsule Take 1 capsule (30 mg total) by mouth daily. 03/23/22  Yes Arnaldo Natal, NP  Omega-3 Fatty Acids (FISH OIL) 500 MG CAPS Take 1,000 mg by mouth 4 (four) times a week.   Yes [provider]  traMADol (ULTRAM) 50 MG tablet Take 1-2 tablets (50-100 mg total) by mouth daily as needed. 02/07/23  Yes Tarry Kos, MD  Vitamin A 2400 MCG (8000 UT) CAPS Take 8,000 Units by mouth 4 (four) times a week.   Yes [provider]  zinc gluconate 50 MG tablet Take 50 mg by mouth 4 (four) times a week.   Yes  [provider]  Naphazoline-Glycerin (CLEAR EYES REDNESS RELIEF OP) Place 1 drop into both eyes 2 (two) times daily as needed (eye irritation).    [provider]  Nutritional Supplements (ENSURE MAX PROTEIN) LIQD Take 1 each by mouth 3 (three) times daily. Please dispense 90 bottles per month Patient taking differently: Take 237 mLs by mouth every 3 (three) days. 06/26/18   Loletta Specter, PA-C    Family History Family History  Problem Relation Age of Onset   Colon cancer Brother    Diabetes Brother    Diabetes Maternal Aunt     Social History Social History   Tobacco Use   Smoking status: Former    Packs/day: 1.25    Years: 44.00    Additional pack years: 0.00    Total pack years: 55.00    Types: Cigarettes    Start date: 66    Quit date: 08/2020  Years since quitting: 2.6   Smokeless tobacco: Never  Vaping Use   Vaping Use: Never used  Substance Use Topics   Alcohol use: Yes    Comment: 1 beer and 3 shots vodka daily   Drug use: Yes    Types: Marijuana     Allergies   Asa [aspirin] and Other   Review of Systems Review of Systems  Constitutional:  Negative for fever.  HENT:  Positive for ear pain. Negative for ear discharge.      Physical Exam Triage Vital Signs ED Triage Vitals  Enc Vitals Group     BP 04/11/23 1001 125/76     Pulse Rate 04/11/23 1001 75     Resp 04/11/23 1001 18     Temp 04/11/23 1001 97.7 F (36.5 C)     Temp src --      SpO2 04/11/23 1001 96 %     Weight --      Height --      Head Circumference --      Peak Flow --      Pain Score 04/11/23 0959 0     Pain Loc --      Pain Edu? --      Excl. in GC? --    No data found.  Updated Vital Signs BP 125/76   Pulse 75   Temp 97.7 F (36.5 C)   Resp 18   SpO2 96%   Visual Acuity Right Eye Distance:   Left Eye Distance:   Bilateral Distance:    Right Eye Near:   Left Eye Near:    Bilateral Near:     Physical Exam Vitals and nursing note  reviewed.  Constitutional:      Appearance: Normal appearance.  HENT:     Head: Normocephalic and atraumatic.     Right Ear: There is impacted cerumen.     Left Ear: There is impacted cerumen.     Nose: Nose normal.     Mouth/Throat:     Mouth: Mucous membranes are moist.  Eyes:     General: No scleral icterus. Cardiovascular:     Rate and Rhythm: Normal rate.  Pulmonary:     Effort: Pulmonary effort is normal. No respiratory distress.  Musculoskeletal:        General: No swelling. Normal range of motion.  Skin:    General: Skin is warm and dry.  Neurological:     General: No focal deficit present.  Psychiatric:        Mood and Affect: Mood normal.        Behavior: Behavior is cooperative.      UC Treatments / Results  Labs (all labs ordered are listed, but only abnormal results are displayed) Labs Reviewed - No data to display  EKG   Radiology No results found.  Procedures Procedures (including critical care time)  Medications Ordered in UC Medications - No data to display  Initial Impression / Assessment and Plan / UC Course  I have reviewed the triage vital signs and the nursing notes.  Pertinent labs & imaging results that were available during my care of the patient were reviewed by me and considered in my medical decision making (see chart for details).  Vitals and triage reviewed, patient is hemodynamically stable.  Bilateral cerumen impaction, staff to perform bilateral ear irrigation.  After irrigation, wax manually removed from the external auditory canal.  Tympanic membrane intact bilaterally and pearly gray.  External auditory canal with  out drainage or tenderness.  No acute infections.  Discussed management for wax in the future.  Plan of care, follow-up care and return precautions given, no questions at this time.     Final Clinical Impressions(s) / UC Diagnoses   Final diagnoses:  Impacted cerumen of both ears     Discharge Instructions       You had wax obstructing both of your canals.  Our staff irrigated and cleaned out your ears.  There are no signs of infection.  I believe your diminished hearing and ear pain was due to the extensive wax buildup.  He continues the wax softening drops twice daily to help soften earwax.  You can also considering doing a little bit of unscented hand sanitizer to your pinky, and applying to your external ear canal.  Please refrain from using Q-tips, as these commonly pushed the wax further back into your canal.  Please return to clinic for any new or concerning symptoms.      ED Prescriptions     Medication Sig Dispense Auth. Provider   carbamide peroxide (DEBROX) 6.5 % OTIC solution Place 5 drops into both ears 2 (two) times daily. 15 mL Adian Jablonowski, [place] N, FNP      PDMP not reviewed this encounter.   Reginaldo Hazard, [place] N, [place] 04/11/23 1113

## 2023-04-12 ENCOUNTER — Telehealth: Payer: Self-pay | Admitting: Acute Care

## 2023-04-12 DIAGNOSIS — Z122 Encounter for screening for malignant neoplasm of respiratory organs: Secondary | ICD-10-CM

## 2023-04-12 DIAGNOSIS — Z87891 Personal history of nicotine dependence: Secondary | ICD-10-CM

## 2023-04-12 NOTE — Telephone Encounter (Signed)
Called and left another VM for pt. Will try to call brother if pt does not return call.

## 2023-04-12 NOTE — Telephone Encounter (Signed)
I have attempted to call the patient with the results of their  Low Dose CT Chest Lung cancer screening scan. There was no answer. I have left a HIPPA compliant VM requesting the patient call the office for the scan results. I included the office contact information in the message. We will await his return call. If no return call we will continue to call until patient is contacted.    Ldies, let's send him a letter. We have called several times. He needs a 3 month follow up scan. Thanks so much

## 2023-04-13 ENCOUNTER — Encounter: Payer: Self-pay | Admitting: Emergency Medicine

## 2023-04-13 NOTE — Telephone Encounter (Signed)
Letter has been printed and mailed to patient. 3 month repeat scan order has been placed. Called patient's brother, Brett Canales (on [place]), and informed Brett Canales to relay the message to call our office regarding some results. Brett Canales states he will let pt know today.

## 2023-04-13 NOTE — Telephone Encounter (Signed)
Patient returned call. Informed him of the results and recs per Kandice Robinsons, NP. Advised him of the recommendation to repeat the scan in 3 months. Pt verbalized understanding of results and is aware of plan and is agreeable to repeat scan. Nothing further needed. Scan order has already been placed.

## 2023-04-13 NOTE — Telephone Encounter (Signed)
See phone note from 04/12/2023. I will close this encounter and send letter to patient due to several unsuccessful attempts to reach pt. Order placed for 3 month scan.

## 2023-06-22 ENCOUNTER — Encounter: Payer: Self-pay | Admitting: Sports Medicine

## 2023-06-22 ENCOUNTER — Ambulatory Visit (INDEPENDENT_AMBULATORY_CARE_PROVIDER_SITE_OTHER): Payer: Medicare Other | Admitting: Sports Medicine

## 2023-06-22 DIAGNOSIS — M25511 Pain in right shoulder: Secondary | ICD-10-CM

## 2023-06-22 DIAGNOSIS — M25561 Pain in right knee: Secondary | ICD-10-CM | POA: Diagnosis not present

## 2023-06-22 DIAGNOSIS — M1711 Unilateral primary osteoarthritis, right knee: Secondary | ICD-10-CM

## 2023-06-22 DIAGNOSIS — G8929 Other chronic pain: Secondary | ICD-10-CM | POA: Diagnosis not present

## 2023-06-22 DIAGNOSIS — M25512 Pain in left shoulder: Secondary | ICD-10-CM | POA: Diagnosis not present

## 2023-06-22 DIAGNOSIS — M25551 Pain in right hip: Secondary | ICD-10-CM

## 2023-06-22 DIAGNOSIS — M25562 Pain in left knee: Secondary | ICD-10-CM

## 2023-06-22 MED ORDER — METHYLPREDNISOLONE ACETATE 40 MG/ML IJ SUSP
40.0000 mg | INTRAMUSCULAR | Status: AC | PRN
Start: 2023-06-22 — End: 2023-06-22
  Administered 2023-06-22: 40 mg via INTRA_ARTICULAR

## 2023-06-22 MED ORDER — BUPIVACAINE HCL 0.25 % IJ SOLN
2.0000 mL | INTRAMUSCULAR | Status: AC | PRN
  Administered 2023-06-22: 2 mL via INTRA_ARTICULAR

## 2023-06-22 MED ORDER — LIDOCAINE HCL 1 % IJ SOLN
2.0000 mL | INTRAMUSCULAR | Status: AC | PRN
  Administered 2023-06-22: 2 mL

## 2023-06-22 MED ORDER — LIDOCAINE HCL 1 % IJ SOLN
2.0000 mL | INTRAMUSCULAR | Status: AC | PRN
Start: 2023-06-22 — End: 2023-06-22
  Administered 2023-06-22: 2 mL

## 2023-06-22 NOTE — Progress Notes (Signed)
Raymond Jacobs - 72 y.o. male MRN [id number]  Date of birth: [birth year]  Office Visit Note: Visit Date: 06/22/2023 PCP: Pcp, No Referred by: No ref. provider found  Subjective: Chief Complaint  Patient presents with   Right Shoulder - Follow-up, Pain   Left Shoulder - Follow-up, Pain   HPI: Raymond Jacobs is a pleasant 72 y.o. male who presents today for bilateral shoulder pain, also with right knee OA.  Had b/l US-guided GHJ shoulder injections in April - helped tremendously.  Was able to get back into all activity without any pain.  Care of the last few weeks or so his pain started to return.  Has some pain with reaching motions, doing push-ups, etc.  He is very active with physical activity, riding his e-bike.  He is also having some pain over the right lateral leg sometimes at the knee sometimes at the hip.  Has used topical Voltaren gel and Tylenol before, no scheduled medications recently.  Pertinent ROS were reviewed with the patient and found to be negative unless otherwise specified above in HPI.   Assessment & Plan: Visit Diagnoses:  1. Chronic pain of both shoulders   2. Chronic pain of both knees   3. Unilateral primary osteoarthritis, right knee   4. Greater trochanteric pain syndrome of right lower extremity    Plan: Raymond Jacobs is having exacerbation of his chronic right and left shoulder pain.  Did review his x-rays which do not show much arthritic change.  His exam points to more rotator cuff irritation with pain with reaching motions.  In the past he did get excellent relief from ultrasound-guided glenohumeral joint injections, however I feel subacromial joint injection would better serve him given his exam today.  Through shared decision making to proceed with bilateral subacromial joint injections, patient tolerated well.  Will allow for 48 hours of modified rest and activity, may use ice, Tylenol or topical Voltaren gel.  Discussed safe physical activities for his knee  and leg pain.  He will continue to use exercises for the shoulder/humerus given at formalized physical therapy, start use on Saturday evening. Recommend topical voltaren gel for the right knee. I would like him to follow-up in about 1.5 weeks to reevaluate the shoulders to see how they are doing and further evaluate the right leg.  Will likely proceed with hip x-rays on the right side at that time as well.  Follow-up: Return in about 12 days (around 07/04/2023) for for b/l shoulder f/u and R-leg (need x-rays of R-hip) - 30 min.   Meds & Orders: No orders of the defined types were placed in this encounter.   Orders Placed This Encounter  Procedures   Large Joint Inj   Large Joint Inj     Procedures: Large Joint Inj: R subacromial bursa on 06/22/2023 11:16 AM Indications: pain Details: 22 G 1.5 in needle, posterior approach Medications: 2 mL lidocaine 1 %; 2 mL bupivacaine 0.25 %; 40 mg methylPREDNISolone acetate 40 MG/ML Outcome: tolerated well, no immediate complications  Subacromial Joint Injection, Right Shoulder After discussion on risks/benefits/indications, informed verbal consent was obtained. A timeout was then performed. Patient was seated on table in exam room. The patient's shoulder was prepped with betadine and alcohol swabs and utilizing posterior approach a 22G, 1.5" needle was directed anteriorly and laterally into the patient's subacromial space was injected with 2:2:1 mixture of lidocaine:bupivicaine:depomedrol with appreciation of free-flowing of the injectate into the bursal space. Patient tolerated the procedure well without  immediate complications.   Procedure, treatment alternatives, risks and benefits explained, specific risks discussed. Consent was given by the patient. Immediately prior to procedure a time out was called to verify the correct patient, procedure, equipment, support staff and site/side marked as required. Patient was prepped and draped in the usual sterile  fashion.    Large Joint Inj: L subacromial bursa on 06/22/2023 11:17 AM Indications: pain Details: 22 G 1.5 in needle, posterior approach Medications: 2 mL lidocaine 1 %; 2 mL bupivacaine 0.25 %; 40 mg methylPREDNISolone acetate 40 MG/ML Outcome: tolerated well, no immediate complications  Subacromial Joint Injection, Left Shoulder After discussion on risks/benefits/indications, informed verbal consent was obtained. A timeout was then performed. Patient was seated on table in exam room. The patient's shoulder was prepped with betadine and alcohol swabs and utilizing posterior approach a 22G, 1.5" needle was directed anteriorly and laterally into the patient's subacromial space was injected with 2:2:1 mixture of lidocaine:bupivicaine:depomedrol with appreciation of free-flowing of the injectate into the bursal space. Patient tolerated the procedure well without immediate complications.   Procedure, treatment alternatives, risks and benefits explained, specific risks discussed. Consent was given by the patient. Immediately prior to procedure a time out was called to verify the correct patient, procedure, equipment, support staff and site/side marked as required. Patient was prepped and draped in the usual sterile fashion.          Clinical History: No specialty comments available.  He reports that he quit smoking about 2 years ago. His smoking use included cigarettes. He started smoking about 47 years ago. He has a 56 pack-year smoking history. He has never used smokeless tobacco. No results for input(s): "HGBA1C", "LABURIC" in the last 8760 hours.  Objective:    Physical Exam  Gen: Well-appearing, in no acute distress; non-toxic CV: Well-perfused. Warm.  Resp: Breathing unlabored on room air; no wheezing. Psych: Fluid speech in conversation; appropriate affect; normal thought process Neuro: Sensation intact throughout. No gross coordination deficits.   Ortho Exam - Bilateral shoulders:  No bony TTP, no AC joint TTP.  There is full active and passive range of motion although endrange pain with abduction.  Positive drop arm and some pain with resisted external rotation.  Negative Hawkins impingement testing.  - Right knee: There is no bony tenderness.  No redness swelling or effusion.  Passive range of motion from 0-135 degrees.  No varus or valgus instability.  Imaging:  - Right knee XR (02/16/23): X-rays demonstrate age-appropriate mild osteoarthritis.  Mild joint space  narrowing.   - Left knee XR (02/16/23): X-rays demonstrate age-appropriate osteoarthritis with mild joint space  narrowing.   -Independent review and interpretation of both left and right shoulder x-ray from 02/07/2023 was performed by myself today.  3 views of each shoulder show well located humeral head.  No significant AC joint or glenohumeral joint arthritic change.  There are some mild sclerosis over the greater tuberosity on the right compared to left side.  No acute bony fracture otherwise bony abnormality noted.  Past Medical/Family/Surgical/Social History: Medications & Allergies reviewed per EMR, new medications updated. Patient Active Problem List   Diagnosis Date Noted   Chronic pain of both knees 02/16/2023   S/P laparoscopic fundoplication 07/01/2021   Food bolus obstruction of intestine (HCC)    Gastritis and gastroduodenitis    Duodenal ulcer    Asthma with COPD 08/11/2020   Food impaction of esophagus    Dysphagia    Former smoker 08/13/2018   Centrilobular emphysema (  HCC) 05/09/2018   Atelectasis    Perforated duodenal ulcer (HCC)    Protein-calorie malnutrition, severe 03/31/2018   Abdominal pain 03/30/2018   Achalasia 08/30/2012   PUD (peptic ulcer disease) 08/15/2012   Past Medical History:  Diagnosis Date   Achalasia    Aortic atherosclerosis (HCC) 04/08/2018   CT scan   Asthma    Duodenal ulcer    duodenal bulb   GERD (gastroesophageal reflux disease)    Hiatal hernia     Hyperplastic colon polyp    Pleural effusion    Stomach ulcer    Family History  Problem Relation Age of Onset   Colon cancer Brother    Diabetes Brother    Diabetes Maternal Aunt    Past Surgical History:  Procedure Laterality Date   BALLOON DILATION N/A 05/14/2019   Procedure: BALLOON DILATION;  Surgeon: Napoleon Form, MD;  Location: [facility];  Service: Endoscopy;  Laterality: N/A;  Rigiflex balloon dilation   BIOPSY  08/21/2018   Procedure: BIOPSY;  Surgeon: Beverley Fiedler, MD;  Location: [facility];  Service: Gastroenterology;;   BIOPSY  02/28/2021   Procedure: BIOPSY;  Surgeon: Shellia Cleverly, DO;  Location: [facility];  Service: Gastroenterology;;   BIOPSY  05/24/2021   Procedure: BIOPSY;  Surgeon: Napoleon Form, MD;  Location: [facility];  Service: Endoscopy;;   BOTOX INJECTION N/A 08/21/2018   Procedure: BOTOX INJECTION;  Surgeon: Beverley Fiedler, MD;  Location: [facility];  Service: Gastroenterology;  Laterality: N/A;   ESOPHAGOGASTRODUODENOSCOPY N/A 02/28/2021   Procedure: ESOPHAGOGASTRODUODENOSCOPY (EGD);  Surgeon: Shellia Cleverly, DO;  Location: [facility] END[facility]COPY;  Service: Gastroenterology;  Laterality: N/A;   ESOPHAGOGASTRODUODENOSCOPY N/A 05/24/2021   Procedure: ESOPHAGOGASTRODUODENOSCOPY (EGD);  Surgeon: Napoleon Form, MD;  Location: [facility];  Service: Endoscopy;  Laterality: N/A;   ESOPHAGOGASTRODUODENOSCOPY N/A 07/01/2021   Procedure: ESOPHAGOGASTRODUODENOSCOPY (EGD);  Surgeon: Corliss Skains, MD;  Location: [facility] OR;  Service: Thoracic;  Laterality: N/A;   ESOPHAGOGASTRODUODENOSCOPY (EGD) WITH PROPOFOL N/A 08/21/2018   Procedure: ESOPHAGOGASTRODUODENOSCOPY (EGD) WITH PROPOFOL;  Surgeon: Beverley Fiedler, MD;  [facility];  Service: Gastroenterology;  Laterality: N/A;   ESOPHAGOGASTRODUODENOSCOPY (EGD) WITH PROPOFOL N/A 05/14/2019   Procedure: ESOPHAGOGASTRODUODENOSCOPY (EGD) WITH PROPOFOL;  Surgeon: Napoleon Form,  MD;  Location: [facility];  Service: Endoscopy;  Laterality: N/A;  pneumatic balloon dilation with Gastrografin 2 hours post procedure   FOREIGN BODY REMOVAL  05/14/2019   Procedure: FOREIGN BODY REMOVAL;  Surgeon: Napoleon Form, MD;  [facility]: [facility];  Service: Endoscopy;;   FOREIGN BODY REMOVAL  05/24/2021   Procedure: FOREIGN BODY REMOVAL;  Surgeon: Napoleon Form, MD;  Location: [facility];  Service: Endoscopy;;   IR RADIOLOGIST EVAL & MGMT  04/24/2018   MYOTOMY  2023   with Nissen fuldoplication   Social History   Occupational History   Not on file  Tobacco Use   Smoking status: Former    Current packs/day: 0.00    Average packs/day: 1.3 packs/day for 44.8 years (56.0 ttl pk-yrs)    Types: Cigarettes    Start date: 50    Quit date: 08/2020    Years since quitting: 2.8   Smokeless tobacco: Never  Vaping Use   Vaping status: Never Used  Substance and Sexual Activity   Alcohol use: Yes    Comment: 1 beer and 3 shots vodka daily   Drug use: Yes    Types: Marijuana   Sexual activity: Not on file

## 2023-06-22 NOTE — Progress Notes (Signed)
Patient states that he is having trouble with his shoulders again. He states that he can feel them "going out" with certain exercises. He states that injections have helped him in the past and he is interested in getting them again. He also states that his knees, particularly his right knee, are bothering him, but that he can get by.

## 2023-06-30 ENCOUNTER — Inpatient Hospital Stay: Admission: RE | Admit: 2023-06-30 | Payer: Medicare Other | Source: Ambulatory Visit

## 2023-07-04 ENCOUNTER — Other Ambulatory Visit (INDEPENDENT_AMBULATORY_CARE_PROVIDER_SITE_OTHER): Payer: Medicare Other

## 2023-07-04 ENCOUNTER — Ambulatory Visit (INDEPENDENT_AMBULATORY_CARE_PROVIDER_SITE_OTHER): Payer: Medicare Other | Admitting: Sports Medicine

## 2023-07-04 ENCOUNTER — Encounter: Payer: Self-pay | Admitting: Sports Medicine

## 2023-07-04 DIAGNOSIS — M25512 Pain in left shoulder: Secondary | ICD-10-CM

## 2023-07-04 DIAGNOSIS — M1711 Unilateral primary osteoarthritis, right knee: Secondary | ICD-10-CM

## 2023-07-04 DIAGNOSIS — M79604 Pain in right leg: Secondary | ICD-10-CM

## 2023-07-04 DIAGNOSIS — G8929 Other chronic pain: Secondary | ICD-10-CM

## 2023-07-04 DIAGNOSIS — M25551 Pain in right hip: Secondary | ICD-10-CM

## 2023-07-04 DIAGNOSIS — M25511 Pain in right shoulder: Secondary | ICD-10-CM

## 2023-07-04 DIAGNOSIS — R29898 Other symptoms and signs involving the musculoskeletal system: Secondary | ICD-10-CM | POA: Diagnosis not present

## 2023-07-04 NOTE — Progress Notes (Signed)
Patient states that both of his shoulders are feeling much better. He is able to raise them more than he could prior to the last appointment, and he feels that he is close to being able to do pushups again.  Patient says that his right hip and right knee feel like they are giving out on him. He has not taken any medication for the pain or done any home exercises.  Patient says that he has not completed any physical therapy in the clinic.  Patient was instructed in 10 minutes of therapeutic exercises for the right hip to improve strength, ROM and function according to my instructions and plan of care by a Certified Athletic Trainer during the office visit. A customized handout was provided and demonstration of proper technique shown and discussed. Patient did perform exercises and demonstrate understanding through teachback.  All questions discussed and answered.

## 2023-07-04 NOTE — Progress Notes (Signed)
QUITMAN GRENIER - 72 y.o. male MRN [id number]  Date of birth: 1951-07-01  Office Visit Note: Visit Date: 07/04/2023 PCP: Pcp, No Referred by: No ref. provider found  Subjective: Chief Complaint  Patient presents with   Right Hip - Pain   Right Shoulder - Follow-up, Pain   Left Shoulder - Follow-up, Pain   HPI: Raymond Jacobs is a pleasant 72 y.o. male who presents today for follow-up of bilateral shoulder pain and right hip/knee pain.  Bilateral shoulders: Both shoulders are feeling vastly improved after subsequent subacromial joint injections 2 weeks ago.  He has no restriction and feels like he is able to become active and try push-ups again.  He feels some pain in the right hip and down into the knee.  At times feels like it may give out on him.  He denies any redness or swelling.  He is not taking any medication for this.  Pertinent ROS were reviewed with the patient and found to be negative unless otherwise specified above in HPI.   Assessment & Plan: Visit Diagnoses:  1. Pain in right leg   2. Chronic pain of both shoulders   3. Greater trochanteric pain syndrome of right lower extremity   4. Weakness of right hip   5. Unilateral primary osteoarthritis, right knee    Plan: Discussed with Raymond Jacobs today that we both are pleased that his shoulders had such good relief from the subacromial joint injections.  He has not yet gotten into formalized physical therapy for the shoulders, but would like to do so.  In terms of his right hip pain, this is most indicative of greater trochanteric pain syndrome as he has some weakness with resisted hip abduction with his gluteal musculature.  He would like to become active and get his hips balance.  He would like to try some home exercises for this specifically.  We did print out a customized handout for his hip strengthening and stabilization exercises and my athletic trainer, Isabelle Course did review these exercises with him in the room.  He is to  perform these once daily.  He may also do some home exercises for his shoulders until he is able to get into formalized therapy.  May take over-the-counter as needed.  He will follow-up in about 6 weeks for both shoulders and the right hip for reevaluation.  Follow-up: Return in about 6 weeks (around 08/15/2023) for for R-hip, b/l shoulders (30-min appt).   Meds & Orders: No orders of the defined types were placed in this encounter.   Orders Placed This Encounter  Procedures   XR HIP UNILAT W OR W/O PELVIS 2-3 VIEWS RIGHT   Ambulatory referral to Physical Therapy     Procedures: No procedures performed      Clinical History: No specialty comments available.  He reports that he quit smoking about 2 years ago. His smoking use included cigarettes. He started smoking about 47 years ago. He has a 56 pack-year smoking history. He has never used smokeless tobacco. No results for input(s): "HGBA1C", "LABURIC" in the last 8760 hours.  Objective:    Physical Exam  Gen: Well-appearing, in no acute distress; non-toxic CV:  Well-perfused. Warm.  Resp: Breathing unlabored on room air; no wheezing. Psych: Fluid speech in conversation; appropriate affect; normal thought process Neuro: Sensation intact throughout. No gross coordination deficits.   Ortho Exam - Bilateral shoulders: He has no bony tenderness today.  Full range of motion with both active and passive movements.  There is no redness swelling or effusion.  Negative drop arm, full strength of the rotator cuff testing.  - R-hip: No significant bony TTP.  There is no restriction with internal or external passive logroll.  Negative FADIR test.  There is pain associated with FABER testing.  There is some weakness with resisted hip abduction on the right compared to the contralateral left hip.  No redness swelling or effusion of the bursa on the lateral aspect.  Negative Stinchfield test.  Imaging:  XR HIP UNILAT W OR W/O PELVIS 2-3 VIEWS  RIGHT 2 views of the right hip including AP and lateral film were ordered and  reviewed by myself.  X-rays show femoral head well-seated within the  acetabulum.  There is no significant osteoarthritic change.  There is a  very small calcification off the superior aspect of the lateral  trochanter.   - R knee XR 02/16/23: X-rays demonstrate age-appropriate mild osteoarthritis.  Mild joint space  narrowing.   - L and R shoulder XR 02/07/23: 3 views of each shoulder show well located humeral head. No significant AC joint or glenohumeral joint arthritic change. There are some mild sclerosis over the greater tuberosity on the right compared to left side. No acute bony fracture otherwise bony abnormality noted.   Past Medical/Family/Surgical/Social History: Medications & Allergies reviewed per EMR, new medications updated. Patient Active Problem List   Diagnosis Date Noted   Chronic pain of both knees 02/16/2023   S/P laparoscopic fundoplication 07/01/2021   Food bolus obstruction of intestine (HCC)    Gastritis and gastroduodenitis    Duodenal ulcer    Asthma with COPD 08/11/2020   Food impaction of esophagus    Dysphagia    Former smoker 08/13/2018   Centrilobular emphysema (HCC) 05/09/2018   Atelectasis    Perforated duodenal ulcer (HCC)    Protein-calorie malnutrition, severe 03/31/2018   Abdominal pain 03/30/2018   Achalasia 08/30/2012   PUD (peptic ulcer disease) 08/15/2012   Past Medical History:  Diagnosis Date   Achalasia    Aortic atherosclerosis (HCC) 04/08/2018   CT scan   Asthma    Duodenal ulcer    duodenal bulb   GERD (gastroesophageal reflux disease)    Hiatal hernia    Hyperplastic colon polyp    Pleural effusion    Stomach ulcer    Family History  Problem Relation Age of Onset   Colon cancer Brother    Diabetes Brother    Diabetes Maternal Aunt    Past Surgical History:  Procedure Laterality Date   BALLOON DILATION N/A 05/14/2019   Procedure: BALLOON  DILATION;  Surgeon: Napoleon Form, MD;  Location: [facility];  Service: Endoscopy;  Laterality: N/A;  Rigiflex balloon dilation   BIOPSY  08/21/2018   Procedure: BIOPSY;  Surgeon: Beverley Fiedler, MD;  Location: [facility];  Service: Gastroenterology;;   BIOPSY  02/28/2021   Procedure: BIOPSY;  Surgeon: Shellia Cleverly, DO;  Location: [facility];  Service: Gastroenterology;;   BIOPSY  05/24/2021   Procedure: BIOPSY;  Surgeon: Napoleon Form, MD;  Location: [facility];  Service: Endoscopy;;   BOTOX INJECTION N/A 08/21/2018   Procedure: BOTOX INJECTION;  Surgeon: Beverley Fiedler, MD;  Location: [facility];  Service: Gastroenterology;  Laterality: N/A;   ESOPHAGOGASTRODUODENOSCOPY N/A 02/28/2021   Procedure: ESOPHAGOGASTRODUODENOSCOPY (EGD);  Surgeon: Shellia Cleverly, DO;  [facility];  Service: Gastroenterology;  Laterality: N/A;   ESOPHAGOGASTRODUODENOSCOPY N/A 05/24/2021   Procedure: ESOPHAGOGASTRODUODENOSCOPY (EGD);  Surgeon: Lavon Paganini,  Eleonore Chiquito, MD;  Location: [facility];  Service: Endoscopy;  Laterality: N/A;   ESOPHAGOGASTRODUODENOSCOPY N/A 07/01/2021   Procedure: ESOPHAGOGASTRODUODENOSCOPY (EGD);  Surgeon: Corliss Skains, MD;  Location: [facility] OR;  Service: Thoracic;  Laterality: N/A;   ESOPHAGOGASTRODUODENOSCOPY (EGD) WITH PROPOFOL N/A 08/21/2018   Procedure: ESOPHAGOGASTRODUODENOSCOPY (EGD) WITH PROPOFOL;  Surgeon: Beverley Fiedler, MD;  Location: [facility];  Service: Gastroenterology;  Laterality: N/A;   ESOPHAGOGASTRODUODENOSCOPY (EGD) WITH PROPOFOL N/A 05/14/2019   Procedure: ESOPHAGOGASTRODUODENOSCOPY (EGD) WITH PROPOFOL;  Surgeon: Napoleon Form, MD;  Location: [facility];  Service: Endoscopy;  Laterality: N/A;  pneumatic balloon dilation with Gastrografin 2 hours post procedure   FOREIGN BODY REMOVAL  05/14/2019   Procedure: FOREIGN BODY REMOVAL;  Surgeon: Napoleon Form, MD;  Location: [facility];  Service: Endoscopy;;   FOREIGN  BODY REMOVAL  05/24/2021   Procedure: FOREIGN BODY REMOVAL;  Surgeon: Napoleon Form, MD;  Location: [facility];  Service: Endoscopy;;   IR RADIOLOGIST EVAL & MGMT  04/24/2018   MYOTOMY  2023   with Nissen fuldoplication   Social History   Occupational History   Not on file  Tobacco Use   Smoking status: Former    Current packs/day: 0.00    Average packs/day: 1.3 packs/day for 44.8 years (56.0 ttl pk-yrs)    Types: Cigarettes    Start date: 27    Quit date: 08/2020    Years since quitting: 2.8   Smokeless tobacco: Never  Vaping Use   Vaping status: Never Used  Substance and Sexual Activity   Alcohol use: Yes    Comment: 1 beer and 3 shots vodka daily   Drug use: Yes    Types: Marijuana   Sexual activity: Not on file

## 2023-07-12 ENCOUNTER — Ambulatory Visit: Payer: Medicare Other | Admitting: Physical Therapy

## 2023-07-22 ENCOUNTER — Other Ambulatory Visit: Payer: Self-pay | Admitting: Pulmonary Disease

## 2023-07-28 ENCOUNTER — Telehealth: Payer: Self-pay | Admitting: Pulmonary Disease

## 2023-07-28 NOTE — Telephone Encounter (Signed)
Patient needs a refill of Albuterol inhaler. Hasn't been seen since 2022. Appointment was scheduled for next availability which is November 21 with Ames Dura NP.

## 2023-08-02 NOTE — Telephone Encounter (Signed)
Lm x1 for patient.  Appt is needed prior to refills.

## 2023-08-04 NOTE — Telephone Encounter (Signed)
Attempted to call pt but unable to reach. Left message to return call.   Due to multiple attempts trying to call pt with being unable to reach, per protocol encounter will be closed.

## 2023-08-15 ENCOUNTER — Ambulatory Visit (INDEPENDENT_AMBULATORY_CARE_PROVIDER_SITE_OTHER): Payer: Medicare Other | Admitting: Sports Medicine

## 2023-08-15 ENCOUNTER — Encounter: Payer: Self-pay | Admitting: Sports Medicine

## 2023-08-15 DIAGNOSIS — M25511 Pain in right shoulder: Secondary | ICD-10-CM

## 2023-08-15 DIAGNOSIS — G8929 Other chronic pain: Secondary | ICD-10-CM

## 2023-08-15 DIAGNOSIS — M25512 Pain in left shoulder: Secondary | ICD-10-CM

## 2023-08-15 DIAGNOSIS — M25551 Pain in right hip: Secondary | ICD-10-CM

## 2023-08-15 DIAGNOSIS — M1711 Unilateral primary osteoarthritis, right knee: Secondary | ICD-10-CM | POA: Diagnosis not present

## 2023-08-15 NOTE — Progress Notes (Signed)
Patient says that his right shoulder is feeling great and not painful anymore. He says that his left shoulder is better but there are still movements that are painful (raising his arm, horizontal abduction) and he does not do all of his normal exercises due to pain (pushups). Patient says that his left knee is feeling good, and his right knee and hip are better but do bother him from time to time. Patient says he has done is HEP for his hip and it has helped some. Patient says he missed his therapy appointment but is still interested in going.

## 2023-08-15 NOTE — Progress Notes (Signed)
Raymond Jacobs - 72 y.o. male MRN [id number]  Date of birth: Apr 20, 1951  Office Visit Note: Visit Date: 08/15/2023 PCP: Pcp, No Referred by: No ref. provider found  Subjective: Chief Complaint  Patient presents with   Right Shoulder - Follow-up   Left Shoulder - Follow-up   Right Hip - Follow-up   HPI: Raymond Jacobs is a pleasant 72 y.o. male who presents today for bilateral shoulder pain, right hip follow-up.   Shoulders -right shoulder is doing excellent, no issues.  Left shoulder still doing well but occasionally notices some pain with movements.  His right knee is doing well, he is doing some of his home therapy for this and the hip.  He did miss his formalized physical therapy appointment but is still interested in going.  He is not taking any consistent medication for his pain.  Pertinent ROS were reviewed with the patient and found to be negative unless otherwise specified above in HPI.   Assessment & Plan: Visit Diagnoses:  1. Chronic pain of both shoulders   2. Greater trochanteric pain syndrome of right lower extremity   3. Unilateral primary osteoarthritis, right knee    Plan: Raymond Jacobs has made good improvements from prior subacromial and intra-articular injections to both shoulders.  The right 1 is 100% but he still has some lingering issues with the left shoulder.  Would like him to get started in formalized physical therapy for this, he will make an appointment today.  He is making progress with his hip abduction deficiency with home therapy, he will continue this on a daily basis for the hip and his knee.  Could consider a one-time corticosteroid injection into the knee in the future, but since he is doing better we will certainly hold on this for now.  Okay for over-the-counter anti-inflammatories as needed.  He will follow-up in 1 month for reevaluation of the left shoulder, possibly could give a glenohumeral joint injection if still bothering him at that time, we will  also follow-up on his right hip and knee pain.  Follow-up: Return in about 4 weeks (around 09/12/2023) for Left shoulder US-guided inj and R-knee f/u (30-min).   Meds & Orders: No orders of the defined types were placed in this encounter.  No orders of the defined types were placed in this encounter.    Procedures: No procedures performed      Clinical History: No specialty comments available.  He reports that he quit smoking about 2 years ago. His smoking use included cigarettes. He started smoking about 47 years ago. He has a 56 pack-year smoking history. He has never used smokeless tobacco. No results for input(s): "HGBA1C", "LABURIC" in the last 8760 hours.  Objective:    Physical Exam  Gen: Well-appearing, in no acute distress; non-toxic CV: Well-perfused. Warm.  Resp: Breathing unlabored on room air; no wheezing. Psych: Fluid speech in conversation; appropriate affect; normal thought process Neuro: Sensation intact throughout. No gross coordination deficits.   Ortho Exam - Right hip: No TTP.  There is still some weakness with hip abduction but improved from prior visits.  No redness or swelling.  - Left shoulder: There is full active range of motion, some mild discomfort through full range external and internal rotation.  No AC joint TTP.  Imaging:  XR HIP UNILAT W OR W/O PELVIS 2-3 VIEWS RIGHT 2 views of the right hip including AP and lateral film were ordered and  reviewed by myself.  X-rays show femoral head  well-seated within the  acetabulum.  There is no significant osteoarthritic change.  There is a  very small calcification off the superior aspect of the lateral  trochanter.   Past Medical/Family/Surgical/Social History: Medications & Allergies reviewed per EMR, new medications updated. Patient Active Problem List   Diagnosis Date Noted   Chronic pain of both knees 02/16/2023   S/P laparoscopic fundoplication 07/01/2021   Food bolus obstruction of intestine  (HCC)    Gastritis and gastroduodenitis    Duodenal ulcer    Asthma with COPD (HCC) 08/11/2020   Food impaction of esophagus    Dysphagia    Former smoker 08/13/2018   Centrilobular emphysema (HCC) 05/09/2018   Atelectasis    Perforated duodenal ulcer (HCC)    Protein-calorie malnutrition, severe 03/31/2018   Abdominal pain 03/30/2018   Achalasia 08/30/2012   PUD (peptic ulcer disease) 08/15/2012   Past Medical History:  Diagnosis Date   Achalasia    Aortic atherosclerosis (HCC) 04/08/2018   CT scan   Asthma    Duodenal ulcer    duodenal bulb   GERD (gastroesophageal reflux disease)    Hiatal hernia    Hyperplastic colon polyp    Pleural effusion    Stomach ulcer    Family History  Problem Relation Age of Onset   Colon cancer Brother    Diabetes Brother    Diabetes Maternal Aunt    Past Surgical History:  Procedure Laterality Date   BALLOON DILATION N/A 05/14/2019   Procedure: BALLOON DILATION;  Surgeon: Napoleon Form, MD;  Location: [facility];  Service: Endoscopy;  Laterality: N/A;  Rigiflex balloon dilation   BIOPSY  08/21/2018   Procedure: BIOPSY;  Surgeon: Beverley Fiedler, MD;  Location: [facility];  Service: Gastroenterology;;   BIOPSY  02/28/2021   Procedure: BIOPSY;  Surgeon: Shellia Cleverly, DO;  Location: [facility];  Service: Gastroenterology;;   BIOPSY  05/24/2021   Procedure: BIOPSY;  Surgeon: Napoleon Form, MD;  Location: [facility];  Service: Endoscopy;;   BOTOX INJECTION N/A 08/21/2018   Procedure: BOTOX INJECTION;  Surgeon: Beverley Fiedler, MD;  Location: [facility];  Service: Gastroenterology;  Laterality: N/A;   ESOPHAGOGASTRODUODENOSCOPY N/A 02/28/2021   Procedure: ESOPHAGOGASTRODUODENOSCOPY (EGD);  Surgeon: Shellia Cleverly, DO;  Location: [facility] ENDOSCOPY;  Service: Gastroenterology;  Laterality: N/A;   ESOPHAGOGASTRODUODENOSCOPY N/A 05/24/2021   Procedure: ESOPHAGOGASTRODUODENOSCOPY (EGD);  Surgeon: Napoleon Form, MD;   Location: [facility];  Service: Endoscopy;  Laterality: N/A;   ESOPHAGOGASTRODUODENOSCOPY N/A 07/01/2021   Procedure: ESOPHAGOGASTRODUODENOSCOPY (EGD);  Surgeon: Corliss Skains, MD;  Location: [facility] OR;  Service: Thoracic;  Laterality: N/A;   ESOPHAGOGASTRODUODENOSCOPY (EGD) WITH PROPOFOL N/A 08/21/2018   Procedure: ESOPHAGOGASTRODUODENOSCOPY (EGD) WITH PROPOFOL;  Surgeon: Beverley Fiedler, MD;  Location: [facility];  Service: Gastroenterology;  Laterality: N/A;   ESOPHAGOGASTRODUODENOSCOPY (EGD) WITH PROPOFOL N/A 05/14/2019   Procedure: ESOPHAGOGASTRODUODENOSCOPY (EGD) WITH PROPOFOL;  Surgeon: Napoleon Form, MD;  Location: [facility];  Service: Endoscopy;  Laterality: N/A;  pneumatic balloon dilation with Gastrografin 2 hours post procedure   FOREIGN BODY REMOVAL  05/14/2019   Procedure: FOREIGN BODY REMOVAL;  Surgeon: Napoleon Form, MD;  Location: [facility];  Service: Endoscopy;;   FOREIGN BODY REMOVAL  05/24/2021   Procedure: FOREIGN BODY REMOVAL;  Surgeon: Napoleon Form, MD;  Location: [facility];  Service: Endoscopy;;   IR RADIOLOGIST EVAL & MGMT  04/24/2018   MYOTOMY  2023   with Nissen fuldoplication   Social History  Occupational History   Not on file  Tobacco Use   Smoking status: Former    Current packs/day: 0.00    Average packs/day: 1.3 packs/day for 44.8 years (56.0 ttl pk-yrs)    Types: Cigarettes    Start date: 19    Quit date: 08/2020    Years since quitting: 2.9   Smokeless tobacco: Never  Vaping Use   Vaping status: Never Used  Substance and Sexual Activity   Alcohol use: Yes    Comment: 1 beer and 3 shots vodka daily   Drug use: Yes    Types: Marijuana   Sexual activity: Not on file

## 2023-08-17 ENCOUNTER — Other Ambulatory Visit: Payer: Self-pay

## 2023-08-17 ENCOUNTER — Ambulatory Visit (INDEPENDENT_AMBULATORY_CARE_PROVIDER_SITE_OTHER): Payer: Medicare Other | Admitting: Physical Therapy

## 2023-08-17 ENCOUNTER — Encounter: Payer: Self-pay | Admitting: Physical Therapy

## 2023-08-17 DIAGNOSIS — R293 Abnormal posture: Secondary | ICD-10-CM

## 2023-08-17 DIAGNOSIS — M25512 Pain in left shoulder: Secondary | ICD-10-CM

## 2023-08-17 DIAGNOSIS — G8929 Other chronic pain: Secondary | ICD-10-CM

## 2023-08-17 DIAGNOSIS — M6281 Muscle weakness (generalized): Secondary | ICD-10-CM | POA: Diagnosis not present

## 2023-08-17 DIAGNOSIS — M25511 Pain in right shoulder: Secondary | ICD-10-CM

## 2023-08-17 NOTE — Therapy (Signed)
OUTPATIENT PHYSICAL THERAPY EVALUATION   Patient Name: Raymond Jacobs MRN: [id number] DOB:[birth year], 71 y.o., male Today's Date: 08/17/2023  END OF SESSION:  PT End of Session - 08/17/23 0836     Visit Number 1    Number of Visits 6    Date for PT Re-Evaluation 09/28/23    Authorization Type Medicare/[employer]    Progress Note Due on Visit 10    PT Start Time 0805    PT Stop Time 0835    PT Time Calculation (min) 30 min    Activity Tolerance Patient tolerated treatment well    Behavior During Therapy [facility] for tasks assessed/performed             Past Medical History:  Diagnosis Date   Achalasia    Aortic atherosclerosis (HCC) 04/08/2018   CT scan   Asthma    Duodenal ulcer    duodenal bulb   GERD (gastroesophageal reflux disease)    Hiatal hernia    Hyperplastic colon polyp    Pleural effusion    Stomach ulcer    Past Surgical History:  Procedure Laterality Date   BALLOON DILATION N/A 05/14/2019   Procedure: BALLOON DILATION;  Surgeon: Napoleon Form, MD;  Location: [facility];  Service: Endoscopy;  Laterality: N/A;  Rigiflex balloon dilation   BIOPSY  08/21/2018   Procedure: BIOPSY;  Surgeon: Beverley Fiedler, MD;  Location: [facility];  Service: Gastroenterology;;   BIOPSY  02/28/2021   Procedure: BIOPSY;  Surgeon: Shellia Cleverly, DO;  Location: [facility];  Service: Gastroenterology;;   BIOPSY  05/24/2021   Procedure: BIOPSY;  Surgeon: Napoleon Form, MD;  Location: [facility];  Service: Endoscopy;;   BOTOX INJECTION N/A 08/21/2018   Procedure: BOTOX INJECTION;  Surgeon: Beverley Fiedler, MD;  Location: [facility];  Service: Gastroenterology;  Laterality: N/A;   ESOPHAGOGASTRODUODENOSCOPY N/A 02/28/2021   Procedure: ESOPHAGOGASTRODUODENOSCOPY (EGD);  Surgeon: Shellia Cleverly, DO;  Location: [facility] ENDOSCOPY;  Service: Gastroenterology;  Laterality: N/A;   ESOPHAGOGASTRODUODENOSCOPY N/A 05/24/2021   Procedure: ESOPHAGOGASTRODUODENOSCOPY (EGD);   Surgeon: Napoleon Form, MD;  Location: [facility];  Service: Endoscopy;  Laterality: N/A;   ESOPHAGOGASTRODUODENOSCOPY N/A 07/01/2021   Procedure: ESOPHAGOGASTRODUODENOSCOPY (EGD);  Surgeon: Corliss Skains, MD;  Location: [facility] OR;  Service: Thoracic;  Laterality: N/A;   ESOPHAGOGASTRODUODENOSCOPY (EGD) WITH PROPOFOL N/A 08/21/2018   Procedure: ESOPHAGOGASTRODUODENOSCOPY (EGD) WITH PROPOFOL;  Surgeon: Beverley Fiedler, MD;  Location: [facility];  Service: Gastroenterology;  Laterality: N/A;   ESOPHAGOGASTRODUODENOSCOPY (EGD) WITH PROPOFOL N/A 05/14/2019   Procedure: ESOPHAGOGASTRODUODENOSCOPY (EGD) WITH PROPOFOL;  Surgeon: Napoleon Form, MD;  Location: [facility];  Service: Endoscopy;  Laterality: N/A;  pneumatic balloon dilation with Gastrografin 2 hours post procedure   FOREIGN BODY REMOVAL  05/14/2019   Procedure: FOREIGN BODY REMOVAL;  Surgeon: Napoleon Form, MD;  Location: [facility];  Service: Endoscopy;;   FOREIGN BODY REMOVAL  05/24/2021   Procedure: FOREIGN BODY REMOVAL;  Surgeon: Napoleon Form, MD;  Location: [facility];  Service: Endoscopy;;   IR RADIOLOGIST EVAL & MGMT  04/24/2018   MYOTOMY  2023   with Nissen fuldoplication   Patient Active Problem List   Diagnosis Date Noted   Chronic pain of both knees 02/16/2023   S/P laparoscopic fundoplication 07/01/2021   Food bolus obstruction of intestine (HCC)    Gastritis and gastroduodenitis    Duodenal ulcer    Asthma with COPD (HCC) 08/11/2020   Food impaction of esophagus  Dysphagia    Former smoker 08/13/2018   Centrilobular emphysema (HCC) 05/09/2018   Atelectasis    Perforated duodenal ulcer (HCC)    Protein-calorie malnutrition, severe 03/31/2018   Abdominal pain 03/30/2018   Achalasia 08/30/2012   PUD (peptic ulcer disease) 08/15/2012    PCP: Pcp, No  REFERRING PROVIDER: Madelyn Brunner, DO  REFERRING DIAG: M25.511,G89.29,M25.512 (ICD-10-CM) - Chronic pain of both  shoulders  Rationale for Evaluation and Treatment: Rehabilitation  THERAPY DIAG:  Abnormal posture - Plan: PT plan of care cert/re-cert  Chronic pain of both shoulders - Plan: PT plan of care cert/re-cert  Muscle weakness (generalized) - Plan: PT plan of care cert/re-cert  ONSET DATE: 3 months   SUBJECTIVE:                                                                                                                                                                                           SUBJECTIVE STATEMENT: Pt reports pain in Lt shoulder, and hx of Rt shoulder pain which has resolved following recent injection.  He also reports bil hip and knee pain which is outside scope of referral at this time.  PERTINENT HISTORY:  COPD, asthma  PAIN:  Are you having pain? Yes: NPRS scale: 0 currently, up to 5/10 Pain location: Lt shoulder Pain description: aching, sharp Aggravating factors: external rotation and abduction motions Relieving factors: avoiding provoking positions, tylenol  PRECAUTIONS:  None  RED FLAGS: None   WEIGHT BEARING RESTRICTIONS:  No  FALLS:  Has patient fallen in last 6 months? No  LIVING ENVIRONMENT: Lives with: lives alone Lives in: House/apartment  OCCUPATION:  Retired from [employer]  PLOF:  Independent and Leisure: exercise - long walk, then jump roping and bands, sit ups, core, wall juggle, plays music  PATIENT GOALS:  Mobility and improved pain   OBJECTIVE:   DIAGNOSTIC FINDINGS:  X-rays: negative  PATIENT SURVEYS:  08/17/23 FOTO 65 (predicted 71)  COGNITIVE STATUS: Within functional limits for tasks assessed   SENSATION: WFL  POSTURE:  rounded shoulders and forward head  HAND DOMINANCE:  Left  GAIT: 08/17/23 Comments: independent  PALPATION: 08/17/23 some discomfort proximal biceps tendon; infraspinatus trigger points, but do not appear to be active  UPPER EXTREMITY ROM:  All measured in sitting  ROM  Right eval Left eval  Shoulder flexion WNL WNL  Shoulder extension    Shoulder abduction WNL WNL  Shoulder adduction    Shoulder extension    Shoulder internal rotation FIR:  T12/L1 FIR:  T12/L1  Shoulder external rotation WNL WNL   (Blank rows = not tested)   UPPER EXTREMITY MMT:  MMT Right  eval Left eval  Shoulder flexion 4/5 3/5  Shoulder abduction 4/5 3/5  Shoulder internal rotation 5/5 4/5  Shoulder external rotation 4/5 3+/5   (Blank rows = not tested)    SPECIAL TESTS:  08/17/23 Upper Extremity Impingement tests: Hawkins/Kennedy impingement test: positive  on left   TREATMENT:                                                                                                                              DATE:  08/17/23 See HEP - demonstrated with trial reps performed PRN for comprehension, mod cues needed for technique     PATIENT EDUCATION:  Education details: HEP Person educated: Patient Education method: Explanation, Demonstration, and Handouts Education comprehension: verbalized understanding, returned demonstration, and needs further education  HOME EXERCISE PROGRAM: Access Code: [email or web address] URL: [email or web address] Date: 08/17/2023 Prepared by: Moshe Cipro  Exercises - Shoulder External Rotation and Scapular Retraction with Resistance  - 1 x daily - 7 x weekly - 3 sets - 10 reps - 5 sec hold - Standing Shoulder Row with Anchored Resistance  - 1 x daily - 7 x weekly - 3 sets - 10 reps - Standing Shoulder Flexion to 90 Degrees with Dumbbells  - 1 x daily - 7 x weekly - 3 sets - 10 reps - Shoulder Abduction with Dumbbells - Thumbs Up  - 1 x daily - 7 x weekly - 3 sets - 10 reps - Standing Shoulder Internal Rotation Stretch with Towel  - 2 x daily - 7 x weekly - 1 sets - 10 reps - 10 sec hold   ASSESSMENT:  CLINICAL IMPRESSION: Patient is a 72 y.o. male who was seen today for physical therapy evaluation and treatment for bil  shoulder pain. He demonstrates mild ROM and strength deficits as well as postural abnormalities affecting functional mobility.  He will benefit from PT to address deficits listed.   OBJECTIVE IMPAIRMENTS: decreased ROM, decreased strength, increased fascial restrictions, increased muscle spasms, impaired UE functional use, and pain.   ACTIVITY LIMITATIONS: carrying, lifting, bathing, reach over head, and hygiene/grooming  PARTICIPATION LIMITATIONS: meal prep, cleaning, community activity, and yard work  PERSONAL FACTORS: Past/current experiences and 1-2 comorbidities: asthma, COPD  are also affecting patient's functional outcome.   REHAB POTENTIAL: Good  CLINICAL DECISION MAKING: Stable/uncomplicated  EVALUATION COMPLEXITY: Low   GOALS: Goals reviewed with patient? Yes  SHORT TERM GOALS: Target date: 09/07/2023  Independent with initial HEP Goal status: INITIAL   LONG TERM GOALS: Target date: 09/28/2023  Independent with final HEP Goal status: INITIAL  2.  FOTO score improved to 71 Goal status: INITIAL  3.  Bil shoulder strength improved to 5/5 for improved function Goal status: INIITAL  4.  Report pain < 3/10 with reaching activities for improved function Goal status: INITIAL   PLAN:  PT FREQUENCY: 1x/week  PT DURATION: 6 weeks  PLANNED INTERVENTIONS: 97164- PT Re-evaluation, 97110-Therapeutic exercises, 97530- Therapeutic activity, 97112- Neuromuscular re-education,  62130- Self Care, 86578- Manual therapy, [id number]- Aquatic Therapy, 97014- Electrical stimulation (unattended), [id number]- Ultrasound, [id number]- Ionotophoresis 4mg /ml Dexamethasone, Patient/Family education, Taping, Dry Needling, Joint mobilization, Cryotherapy, and Moist heat.  PLAN FOR NEXT SESSION: review HEP, postural strengthening   NEXT MD VISIT: 09/12/23   Clarita Crane, PT, DPT 08/17/23 8:53 AM

## 2023-08-25 ENCOUNTER — Telehealth: Payer: Self-pay | Admitting: Physical Therapy

## 2023-08-25 ENCOUNTER — Encounter: Payer: Medicare Other | Admitting: Physical Therapy

## 2023-08-25 NOTE — Telephone Encounter (Signed)
No-show for today's appt. Called and LMOM about missed visit and time/date of next session.  Nedra Hai, PT, DPT 08/25/23 9:49 AM

## 2023-09-01 ENCOUNTER — Encounter: Payer: Self-pay | Admitting: Physical Therapy

## 2023-09-01 ENCOUNTER — Ambulatory Visit (INDEPENDENT_AMBULATORY_CARE_PROVIDER_SITE_OTHER): Payer: Medicare Other | Admitting: Physical Therapy

## 2023-09-01 DIAGNOSIS — M6281 Muscle weakness (generalized): Secondary | ICD-10-CM | POA: Diagnosis not present

## 2023-09-01 DIAGNOSIS — G8929 Other chronic pain: Secondary | ICD-10-CM

## 2023-09-01 DIAGNOSIS — M25511 Pain in right shoulder: Secondary | ICD-10-CM

## 2023-09-01 DIAGNOSIS — M25512 Pain in left shoulder: Secondary | ICD-10-CM

## 2023-09-01 DIAGNOSIS — R293 Abnormal posture: Secondary | ICD-10-CM

## 2023-09-01 DIAGNOSIS — M25612 Stiffness of left shoulder, not elsewhere classified: Secondary | ICD-10-CM | POA: Diagnosis not present

## 2023-09-01 DIAGNOSIS — M25611 Stiffness of right shoulder, not elsewhere classified: Secondary | ICD-10-CM

## 2023-09-01 NOTE — Therapy (Signed)
OUTPATIENT PHYSICAL THERAPY TREATMENT   Patient Name: Raymond Jacobs MRN: [id number] DOB:December 01, 1950, 72 y.o., male Today's Date: 09/01/2023  END OF SESSION:  PT End of Session - 09/01/23 0925     Visit Number 2    Number of Visits 6    Date for PT Re-Evaluation 09/28/23    Authorization Type Medicare/Aetna    Progress Note Due on Visit 10    PT Start Time 0926    PT Stop Time 1004    PT Time Calculation (min) 38 min    Activity Tolerance Patient tolerated treatment well    Behavior During Therapy [facility] for tasks assessed/performed              Past Medical History:  Diagnosis Date   Achalasia    Aortic atherosclerosis (HCC) 04/08/2018   CT scan   Asthma    Duodenal ulcer    duodenal bulb   GERD (gastroesophageal reflux disease)    Hiatal hernia    Hyperplastic colon polyp    Pleural effusion    Stomach ulcer    Past Surgical History:  Procedure Laterality Date   BALLOON DILATION N/A 05/14/2019   Procedure: BALLOON DILATION;  Surgeon: Napoleon Form, MD;  Location: [facility];  Service: Endoscopy;  Laterality: N/A;  Rigiflex balloon dilation   BIOPSY  08/21/2018   Procedure: BIOPSY;  Surgeon: Beverley Fiedler, MD;  Location: [facility];  Service: Gastroenterology;;   BIOPSY  02/28/2021   Procedure: BIOPSY;  Surgeon: Shellia Cleverly, DO;  Location: [facility];  Service: Gastroenterology;;   BIOPSY  05/24/2021   Procedure: BIOPSY;  Surgeon: Napoleon Form, MD;  Location: [facility];  Service: Endoscopy;;   BOTOX INJECTION N/A 08/21/2018   Procedure: BOTOX INJECTION;  Surgeon: Beverley Fiedler, MD;  Location: [facility];  Service: Gastroenterology;  Laterality: N/A;   ESOPHAGOGASTRODUODENOSCOPY N/A 02/28/2021   Procedure: ESOPHAGOGASTRODUODENOSCOPY (EGD);  Surgeon: Shellia Cleverly, DO;  Location: [facility] ENDOSCOPY;  Service: Gastroenterology;  Laterality: N/A;   ESOPHAGOGASTRODUODENOSCOPY N/A 05/24/2021   Procedure: ESOPHAGOGASTRODUODENOSCOPY (EGD);   Surgeon: Napoleon Form, MD;  Location: [facility];  Service: Endoscopy;  Laterality: N/A;   ESOPHAGOGASTRODUODENOSCOPY N/A 07/01/2021   Procedure: ESOPHAGOGASTRODUODENOSCOPY (EGD);  Surgeon: Corliss Skains, MD;  Location: [facility] OR;  Service: Thoracic;  Laterality: N/A;   ESOPHAGOGASTRODUODENOSCOPY (EGD) WITH PROPOFOL N/A 08/21/2018   Procedure: ESOPHAGOGASTRODUODENOSCOPY (EGD) WITH PROPOFOL;  Surgeon: Beverley Fiedler, MD;  Location: [facility];  Service: Gastroenterology;  Laterality: N/A;   ESOPHAGOGASTRODUODENOSCOPY (EGD) WITH PROPOFOL N/A 05/14/2019   Procedure: ESOPHAGOGASTRODUODENOSCOPY (EGD) WITH PROPOFOL;  Surgeon: Napoleon Form, MD;  Location: [facility];  Service: Endoscopy;  Laterality: N/A;  pneumatic balloon dilation with Gastrografin 2 hours post procedure   FOREIGN BODY REMOVAL  05/14/2019   Procedure: FOREIGN BODY REMOVAL;  Surgeon: Napoleon Form, MD;  Location: [facility];  Service: Endoscopy;;   FOREIGN BODY REMOVAL  05/24/2021   Procedure: FOREIGN BODY REMOVAL;  Surgeon: Napoleon Form, MD;  Location: [facility];  Service: Endoscopy;;   IR RADIOLOGIST EVAL & MGMT  04/24/2018   MYOTOMY  2023   with Nissen fuldoplication   Patient Active Problem List   Diagnosis Date Noted   Chronic pain of both knees 02/16/2023   S/P laparoscopic fundoplication 07/01/2021   Food bolus obstruction of intestine (HCC)    Gastritis and gastroduodenitis    Duodenal ulcer    Asthma with COPD (HCC) 08/11/2020   Food impaction of esophagus  Dysphagia    Former smoker 08/13/2018   Centrilobular emphysema (HCC) 05/09/2018   Atelectasis    Perforated duodenal ulcer (HCC)    Protein-calorie malnutrition, severe 03/31/2018   Abdominal pain 03/30/2018   Achalasia 08/30/2012   PUD (peptic ulcer disease) 08/15/2012    PCP: Pcp, No  REFERRING PROVIDER: Madelyn Brunner, DO  REFERRING DIAG: M25.511,G89.29,M25.512 (ICD-10-CM) - Chronic pain of both  shoulders  Rationale for Evaluation and Treatment: Rehabilitation  THERAPY DIAG:  Abnormal posture  Chronic pain of both shoulders  Muscle weakness (generalized)  Stiffness of left shoulder, not elsewhere classified  Stiffness of right shoulder, not elsewhere classified  ONSET DATE: 3 months   SUBJECTIVE:                                                                                                                                                                                           SUBJECTIVE STATEMENT:  I missed my appointment last week, I didn't set my alarm and went right to my regular routine without thinking. I do some of the HEP when I work out but I need to start working on them harder, I've been in a lazy streak recently. Feels like L shoulder is healing up but its not quite there yet.    PERTINENT HISTORY:  COPD, asthma  PAIN:  Are you having pain? Yes: NPRS scale: 1/10 Pain location: Lt shoulder Pain description: stiffness and soreness  Aggravating factors: external rotation and abduction motions, reaching  Relieving factors: avoiding provoking positions, tylenol  PRECAUTIONS:  None  RED FLAGS: None   WEIGHT BEARING RESTRICTIONS:  No  FALLS:  Has patient fallen in last 6 months? No  LIVING ENVIRONMENT: Lives with: lives alone Lives in: House/apartment  OCCUPATION:  Retired from [employer]  PLOF:  Independent and Leisure: exercise - long walk, then jump roping and bands, sit ups, core, wall juggle, plays music  PATIENT GOALS:  Mobility and improved pain   OBJECTIVE:   DIAGNOSTIC FINDINGS:  X-rays: negative  PATIENT SURVEYS:  08/17/23 FOTO 65 (predicted 71)  COGNITIVE STATUS: Within functional limits for tasks assessed   SENSATION: WFL  POSTURE:  rounded shoulders and forward head  HAND DOMINANCE:  Left  GAIT: 08/17/23 Comments: independent  PALPATION: 08/17/23 some discomfort proximal biceps tendon; infraspinatus  trigger points, but do not appear to be active  UPPER EXTREMITY ROM:  All measured in sitting  ROM Right eval Left eval  Shoulder flexion WNL WNL  Shoulder extension    Shoulder abduction WNL WNL  Shoulder adduction    Shoulder extension    Shoulder internal rotation FIR:  T12/L1 FIR:  T12/L1  Shoulder external rotation WNL WNL   (Blank rows = not tested)   UPPER EXTREMITY MMT:  MMT Right eval Left eval  Shoulder flexion 4/5 3/5  Shoulder abduction 4/5 3/5  Shoulder internal rotation 5/5 4/5  Shoulder external rotation 4/5 3+/5   (Blank rows = not tested)    SPECIAL TESTS:  08/17/23 Upper Extremity Impingement tests: Hawkins/Kennedy impingement test: positive  on left   TREATMENT:                                                                                                                              DATE:    09/01/23  TherEx  Scap retractions green TB 12x3 seconds (2 rounds) Shoulder extensions green TB 12x3 seconds (2 rounds) Shoulder ER green TB (1 strap) x12 B elbow tucked to ribs UBE backwards only for postural mm activation x5 minutes L4 Supine shoulder strengthening: flexion 3# 2x10, serratus punches 3# 2x10, shoulder ABD 3# x10, serratus punch + CW/CCW circles x10 each Thoracic ROM x15 each direction Wall pushups x10 BOS WNL     08/17/23 See HEP - demonstrated with trial reps performed PRN for comprehension, mod cues needed for technique     PATIENT EDUCATION:  Education details: HEP Person educated: Patient Education method: Explanation, Demonstration, and Handouts Education comprehension: verbalized understanding, returned demonstration, and needs further education  HOME EXERCISE PROGRAM: Access Code: [email or web address] URL: [email or web address] Date: 08/17/2023 Prepared by: Moshe Cipro  Exercises - Shoulder External Rotation and Scapular Retraction with Resistance  - 1 x daily - 7 x weekly - 3 sets - 10 reps - 5 sec  hold - Standing Shoulder Row with Anchored Resistance  - 1 x daily - 7 x weekly - 3 sets - 10 reps - Standing Shoulder Flexion to 90 Degrees with Dumbbells  - 1 x daily - 7 x weekly - 3 sets - 10 reps - Shoulder Abduction with Dumbbells - Thumbs Up  - 1 x daily - 7 x weekly - 3 sets - 10 reps - Standing Shoulder Internal Rotation Stretch with Towel  - 2 x daily - 7 x weekly - 1 sets - 10 reps - 10 sec hold   ASSESSMENT:  CLINICAL IMPRESSION:  09/01/23  Pt arrives today doing OK, still having shoulder pain with specific motions like reaching. Focused on UE and postural strengthening as per POC with Mod cues for form but good tolerance noted. Does have poor muscle activation and motor control in periscapular groups, also poor mm endurance here. Discussed importance of isolated targeted strengthening instead of exercises that utilize multiple groups at one time (likely leads to compensations and allows key groups to get weak and thus increasing pain). Will continue to progress as able.      EVAL:Patient is a 72 y.o. male who was seen today for physical therapy evaluation and treatment for bil shoulder pain. He demonstrates mild ROM and strength deficits as well as postural abnormalities affecting functional  mobility.  He will benefit from PT to address deficits listed.   OBJECTIVE IMPAIRMENTS: decreased ROM, decreased strength, increased fascial restrictions, increased muscle spasms, impaired UE functional use, and pain.   ACTIVITY LIMITATIONS: carrying, lifting, bathing, reach over head, and hygiene/grooming  PARTICIPATION LIMITATIONS: meal prep, cleaning, community activity, and yard work  PERSONAL FACTORS: Past/current experiences and 1-2 comorbidities: asthma, COPD  are also affecting patient's functional outcome.   REHAB POTENTIAL: Good  CLINICAL DECISION MAKING: Stable/uncomplicated  EVALUATION COMPLEXITY: Low   GOALS: Goals reviewed with patient? Yes  SHORT TERM GOALS:  Target date: 09/07/2023  Independent with initial HEP Goal status: INITIAL   LONG TERM GOALS: Target date: 09/28/2023  Independent with final HEP Goal status: INITIAL  2.  FOTO score improved to 71 Goal status: INITIAL  3.  Bil shoulder strength improved to 5/5 for improved function Goal status: INIITAL  4.  Report pain < 3/10 with reaching activities for improved function Goal status: INITIAL   PLAN:  PT FREQUENCY: 1x/week  PT DURATION: 6 weeks  PLANNED INTERVENTIONS: 97164- PT Re-evaluation, 97110-Therapeutic exercises, 97530- Therapeutic activity, [id number]- Neuromuscular re-education, 97535- Self Care, 11914- Manual therapy, [id number]- Aquatic Therapy, 97014- Electrical stimulation (unattended), [id number]- Ultrasound, 78295- Ionotophoresis 4mg /ml Dexamethasone, Patient/Family education, Taping, Dry Needling, Joint mobilization, Cryotherapy, and Moist heat.  PLAN FOR NEXT SESSION: review HEP, postural strengthening, consider HEP update next visit, get some measures/check goals in prep for MD visit 11/19   NEXT MD VISIT: 09/12/23  Nedra Hai, PT, DPT 09/01/23 10:05 AM

## 2023-09-08 ENCOUNTER — Encounter: Payer: Self-pay | Admitting: Physical Therapy

## 2023-09-08 ENCOUNTER — Ambulatory Visit (INDEPENDENT_AMBULATORY_CARE_PROVIDER_SITE_OTHER): Payer: Medicare Other | Admitting: Physical Therapy

## 2023-09-08 DIAGNOSIS — M25612 Stiffness of left shoulder, not elsewhere classified: Secondary | ICD-10-CM

## 2023-09-08 DIAGNOSIS — R293 Abnormal posture: Secondary | ICD-10-CM

## 2023-09-08 DIAGNOSIS — M6281 Muscle weakness (generalized): Secondary | ICD-10-CM | POA: Diagnosis not present

## 2023-09-08 DIAGNOSIS — M25611 Stiffness of right shoulder, not elsewhere classified: Secondary | ICD-10-CM

## 2023-09-08 DIAGNOSIS — M25511 Pain in right shoulder: Secondary | ICD-10-CM

## 2023-09-08 DIAGNOSIS — G8929 Other chronic pain: Secondary | ICD-10-CM

## 2023-09-08 DIAGNOSIS — M25512 Pain in left shoulder: Secondary | ICD-10-CM

## 2023-09-08 NOTE — Therapy (Signed)
OUTPATIENT PHYSICAL THERAPY TREATMENT/PROGRESS NOTE   Patient Name: Raymond Jacobs MRN: [id number] DOB:1951/03/31, 72 y.o., male Today's Date: 09/08/2023  Progress Note Reporting Period 08/17/23 to 09/08/23  See note below for Objective Data and Assessment of Progress/Goals.      END OF SESSION:  PT End of Session - 09/08/23 0931     Visit Number 3    Number of Visits 6    Date for PT Re-Evaluation 09/28/23    Authorization Type Medicare/[employer]    Progress Note Due on Visit 13   done 3rd visit prior to MD visit   PT Start Time 0932    PT Stop Time 1012    PT Time Calculation (min) 40 min    Activity Tolerance Patient tolerated treatment well    Behavior During Therapy [facility] for tasks assessed/performed               Past Medical History:  Diagnosis Date   Achalasia    Aortic atherosclerosis (HCC) 04/08/2018   CT scan   Asthma    Duodenal ulcer    duodenal bulb   GERD (gastroesophageal reflux disease)    Hiatal hernia    Hyperplastic colon polyp    Pleural effusion    Stomach ulcer    Past Surgical History:  Procedure Laterality Date   BALLOON DILATION N/A 05/14/2019   Procedure: BALLOON DILATION;  Surgeon: Napoleon Form, MD;  Location: [facility];  Service: Endoscopy;  Laterality: N/A;  Rigiflex balloon dilation   BIOPSY  08/21/2018   Procedure: BIOPSY;  Surgeon: Beverley Fiedler, MD;  Location: [facility];  Service: Gastroenterology;;   BIOPSY  02/28/2021   Procedure: BIOPSY;  Surgeon: Shellia Cleverly, DO;  Location: [facility];  Service: Gastroenterology;;   BIOPSY  05/24/2021   Procedure: BIOPSY;  Surgeon: Napoleon Form, MD;  Location: [facility];  Service: Endoscopy;;   BOTOX INJECTION N/A 08/21/2018   Procedure: BOTOX INJECTION;  Surgeon: Beverley Fiedler, MD;  [facility]: [facility];  Service: Gastroenterology;  Laterality: N/A;   ESOPHAGOGASTRODUODENOSCOPY N/A 02/28/2021   Procedure: ESOPHAGOGASTRODUODENOSCOPY (EGD);  Surgeon:  Shellia Cleverly, DO;  Location: [facility] ENDOSCOPY;  Service: Gastroenterology;  Laterality: N/A;   ESOPHAGOGASTRODUODENOSCOPY N/A 05/24/2021   Procedure: ESOPHAGOGASTRODUODENOSCOPY (EGD);  Surgeon: Napoleon Form, MD;  Location: [facility];  Service: Endoscopy;  Laterality: N/A;   ESOPHAGOGASTRODUODENOSCOPY N/A 07/01/2021   Procedure: ESOPHAGOGASTRODUODENOSCOPY (EGD);  Surgeon: Corliss Skains, MD;  Location: [facility] OR;  Service: Thoracic;  Laterality: N/A;   ESOPHAGOGASTRODUODENOSCOPY (EGD) WITH PROPOFOL N/A 08/21/2018   Procedure: ESOPHAGOGASTRODUODENOSCOPY (EGD) WITH PROPOFOL;  Surgeon: Beverley Fiedler, MD;  [facility];  Service: Gastroenterology;  Laterality: N/A;   ESOPHAGOGASTRODUODENOSCOPY (EGD) WITH PROPOFOL N/A 05/14/2019   Procedure: ESOPHAGOGASTRODUODENOSCOPY (EGD) WITH PROPOFOL;  Surgeon: Napoleon Form, MD;  [facility]: [facility];  Service: Endoscopy;  Laterality: N/A;  pneumatic balloon dilation with Gastrografin 2 hours post procedure   FOREIGN BODY REMOVAL  05/14/2019   Procedure: FOREIGN BODY REMOVAL;  Surgeon: Napoleon Form, MD;  [facility]: [facility];  Service: Endoscopy;;   FOREIGN BODY REMOVAL  05/24/2021   Procedure: FOREIGN BODY REMOVAL;  Surgeon: Napoleon Form, MD;  Location: [facility];  Service: Endoscopy;;   IR RADIOLOGIST EVAL & MGMT  04/24/2018   MYOTOMY  2023   with Nissen fuldoplication   Patient Active Problem List   Diagnosis Date Noted   Chronic pain of both knees 02/16/2023   S/P laparoscopic fundoplication 07/01/2021  Food bolus obstruction of intestine (HCC)    Gastritis and gastroduodenitis    Duodenal ulcer    Asthma with COPD (HCC) 08/11/2020   Food impaction of esophagus    Dysphagia    Former smoker 08/13/2018   Centrilobular emphysema (HCC) 05/09/2018   Atelectasis    Perforated duodenal ulcer (HCC)    Protein-calorie malnutrition, severe 03/31/2018   Abdominal pain 03/30/2018   Achalasia 08/30/2012    PUD (peptic ulcer disease) 08/15/2012    PCP: Pcp, No  REFERRING PROVIDER: Madelyn Brunner, DO  REFERRING DIAG: M25.511,G89.29,M25.512 (ICD-10-CM) - Chronic pain of both shoulders  Rationale for Evaluation and Treatment: Rehabilitation  THERAPY DIAG:  Abnormal posture  Chronic pain of both shoulders  Muscle weakness (generalized)  Stiffness of left shoulder, not elsewhere classified  Stiffness of right shoulder, not elsewhere classified  ONSET DATE: 3 months   SUBJECTIVE:                                                                                                                                                                                           SUBJECTIVE STATEMENT:  Doing OK, sleep hasn't been great recently but that's normal for me. Shoulder is getting much better especially considering I couldn't put my clothes on or do much at all before. I feel 95% but I'm still having a hard time with pushups, haven't tried pull ups. Right knee is better but still giving me trouble, I wear a support when it aches.    PERTINENT HISTORY:  COPD, asthma  PAIN:  Are you having pain? Yes: NPRS scale: 1-2/10 Pain location: Lt shoulder Pain description: stiffness and soreness  Aggravating factors: external rotation and abduction motions, reaching, shoulder hikes   Relieving factors: avoiding provoking positions, tylenol  PRECAUTIONS:  None  RED FLAGS: None   WEIGHT BEARING RESTRICTIONS:  No  FALLS:  Has patient fallen in last 6 months? No  LIVING ENVIRONMENT: Lives with: lives alone Lives in: House/apartment  OCCUPATION:  Retired from [employer]  PLOF:  Independent and Leisure: exercise - long walk, then jump roping and bands, sit ups, core, wall juggle, plays music  PATIENT GOALS:  Mobility and improved pain   OBJECTIVE:   DIAGNOSTIC FINDINGS:  X-rays: negative  PATIENT SURVEYS:  08/17/23 FOTO 65 (predicted 71) ; 09/08/23- 63  COGNITIVE  STATUS: Within functional limits for tasks assessed   SENSATION: WFL  POSTURE:  rounded shoulders and forward head  HAND DOMINANCE:  Left  GAIT: 08/17/23 Comments: independent  PALPATION: 08/17/23 some discomfort proximal biceps tendon; infraspinatus trigger points, but do not appear to be active  UPPER EXTREMITY ROM:  All  measured in sitting  ROM Right eval Left eval  Shoulder flexion WNL WNL  Shoulder extension    Shoulder abduction WNL WNL  Shoulder adduction    Shoulder extension    Shoulder internal rotation FIR:  T12/L1 FIR:  T12/L1  Shoulder external rotation WNL WNL   (Blank rows = not tested)   UPPER EXTREMITY MMT:  MMT Right eval Left eval Right 09/08/23 Left 09/08/23  Shoulder flexion 4/5 3/5 4+ 4+  Shoulder abduction 4/5 3/5 4+ 4  Shoulder internal rotation 5/5 4/5 5 4+  Shoulder external rotation 4/5 3+/5 4+ 4+   (Blank rows = not tested)    SPECIAL TESTS:  08/17/23 Upper Extremity Impingement tests: Hawkins/Kennedy impingement test: positive  on left   TREATMENT:                                                                                                                              DATE:   09/08/23  FOTO update, objective measures/goal review/discussion of progress and goals prior to MD visit 09/12/23  UBE L4 x6 minutes backwards only  Counter push-ups x10 Wall alphabet with ball 2 rounds  Shoulder flexion on wall 2# x10 Shoulder ABD on wall 2#x10  Wall ball circles 2x10 CW/2x10 CCW Red TB reverse flies with scap retraction 12x3 second holds standing     09/01/23  TherEx  Scap retractions green TB 12x3 seconds (2 rounds) Shoulder extensions green TB 12x3 seconds (2 rounds) Shoulder ER green TB (1 strap) x12 B elbow tucked to ribs UBE backwards only for postural mm activation x5 minutes L4 Supine shoulder strengthening: flexion 3# 2x10, serratus punches 3# 2x10, shoulder ABD 3# x10, serratus punch + CW/CCW circles x10  each Thoracic ROM x15 each direction Wall pushups x10 BOS WNL     08/17/23 See HEP - demonstrated with trial reps performed PRN for comprehension, mod cues needed for technique     PATIENT EDUCATION:  Education details: HEP Person educated: Patient Education method: Programmer, multimedia, Demonstration, and Handouts Education comprehension: verbalized understanding, returned demonstration, and needs further education  HOME EXERCISE PROGRAM:  Access Code: [email or web address] URL: [email or web address] Date: 09/08/2023 Prepared by: Nedra Hai  Exercises - Shoulder External Rotation and Scapular Retraction with Resistance  - 1 x daily - 7 x weekly - 3 sets - 10 reps - 5 sec hold - Standing Shoulder Row with Anchored Resistance  - 1 x daily - 7 x weekly - 3 sets - 10 reps - Standing Shoulder Flexion to 90 Degrees with Dumbbells  - 1 x daily - 7 x weekly - 3 sets - 10 reps - Shoulder Abduction with Dumbbells - Thumbs Up  - 1 x daily - 7 x weekly - 3 sets - 10 reps - Standing Shoulder Internal Rotation Stretch with Towel  - 2 x daily - 7 x weekly - 1 sets - 10 reps - 10 sec hold - Push-Up on Counter  - 1 x daily -  7 x weekly - 1 sets - 10 reps - Shoulder Alphabet with Ball at Wall  - 1 x daily - 7 x weekly - 1 sets - 2-3 reps  ASSESSMENT:  CLINICAL IMPRESSION:  09/08/23  Pt doing well and making progress towards functional strength and pain goals, but would still benefit from targeted skilled interventions at this time (see objectives as above)- he is agreeable to this plan. Continued with functional strengthening and provided HEP updates as appropriate today. Will continue to increase challenges as able/tolerated.      EVAL:Patient is a 72 y.o. male who was seen today for physical therapy evaluation and treatment for bil shoulder pain. He demonstrates mild ROM and strength deficits as well as postural abnormalities affecting functional mobility.  He will benefit from PT to address  deficits listed.   OBJECTIVE IMPAIRMENTS: decreased ROM, decreased strength, increased fascial restrictions, increased muscle spasms, impaired UE functional use, and pain.   ACTIVITY LIMITATIONS: carrying, lifting, bathing, reach over head, and hygiene/grooming  PARTICIPATION LIMITATIONS: meal prep, cleaning, community activity, and yard work  PERSONAL FACTORS: Past/current experiences and 1-2 comorbidities: asthma, COPD  are also affecting patient's functional outcome.   REHAB POTENTIAL: Good  CLINICAL DECISION MAKING: Stable/uncomplicated  EVALUATION COMPLEXITY: Low   GOALS: Goals reviewed with patient? Yes  SHORT TERM GOALS: Target date: 09/07/2023  Independent with initial HEP Goal status: MET 09/08/23   LONG TERM GOALS: Target date: 09/28/2023  Independent with final HEP Goal status: ONGOING 09/08/23  2.  FOTO score improved to 71 Goal status: ONGOING 09/08/23  3.  Bil shoulder strength improved to 5/5 for improved function Goal status: ONGOING 09/08/23  4.  Report pain < 3/10 with reaching activities for improved function Goal status: PARTIALLY MET 09/08/23- can reach 5-6/10 but rarely, usually sits at 1/10 on a normal day    PLAN:  PT FREQUENCY: 1x/week  PT DURATION: 6 weeks  PLANNED INTERVENTIONS: 97164- PT Re-evaluation, 97110-Therapeutic exercises, 97530- Therapeutic activity, 97112- Neuromuscular re-education, 97535- Self Care, 16109- Manual therapy, [id number]- Aquatic Therapy, 97014- Electrical stimulation (unattended), 97035- Ultrasound, 60454- Ionotophoresis 4mg /ml Dexamethasone, Patient/Family education, Taping, Dry Needling, Joint mobilization, Cryotherapy, and Moist heat.  PLAN FOR NEXT SESSION: review HEP, postural/shoulder strengthening   NEXT MD VISIT: 09/12/23  Nedra Hai, PT, DPT 09/08/23 10:13 AM

## 2023-09-12 ENCOUNTER — Other Ambulatory Visit: Payer: Self-pay

## 2023-09-12 ENCOUNTER — Encounter: Payer: Self-pay | Admitting: Sports Medicine

## 2023-09-12 ENCOUNTER — Ambulatory Visit (INDEPENDENT_AMBULATORY_CARE_PROVIDER_SITE_OTHER): Payer: Medicare Other | Admitting: Sports Medicine

## 2023-09-12 DIAGNOSIS — M1711 Unilateral primary osteoarthritis, right knee: Secondary | ICD-10-CM | POA: Diagnosis not present

## 2023-09-12 DIAGNOSIS — G8929 Other chronic pain: Secondary | ICD-10-CM

## 2023-09-12 DIAGNOSIS — M25512 Pain in left shoulder: Secondary | ICD-10-CM | POA: Diagnosis not present

## 2023-09-12 MED ORDER — BUPIVACAINE HCL 0.25 % IJ SOLN
2.0000 mL | INTRAMUSCULAR | Status: AC | PRN
Start: 2023-09-12 — End: 2023-09-12
  Administered 2023-09-12: 2 mL via INTRA_ARTICULAR

## 2023-09-12 MED ORDER — METHYLPREDNISOLONE ACETATE 40 MG/ML IJ SUSP
80.0000 mg | INTRAMUSCULAR | Status: AC | PRN
  Administered 2023-09-12: 80 mg via INTRA_ARTICULAR

## 2023-09-12 MED ORDER — LIDOCAINE HCL 1 % IJ SOLN
2.0000 mL | INTRAMUSCULAR | Status: AC | PRN
Start: 2023-09-12 — End: 2023-09-12
  Administered 2023-09-12: 2 mL

## 2023-09-12 NOTE — Progress Notes (Signed)
Patient says that he seems to be overall feeling better. He says that he has done 2 sessions of physical therapy and that seems to be helping, and he thinks he is set to continue with exercises on his own. He does still have pain in the left shoulder, particularly when raising the arm. He says that after conversation last visit he is interested in an injection for the left shoulder today.

## 2023-09-12 NOTE — Progress Notes (Signed)
Raymond Jacobs - 72 y.o. male MRN [id number]  Date of birth: 27-Oct-1950  Office Visit Note: Visit Date: 09/12/2023 PCP: Pcp, No Referred by: No ref. provider found  Subjective: Chief Complaint  Patient presents with   Left Shoulder - Follow-up   Right Knee - Follow-up   HPI: Raymond Jacobs is a pleasant 72 y.o. male who presents today for follow-up of left shoulder and right knee pain.  Shoulders -the right shoulder continues to do well, no issues with that.  The left shoulder he continues doing his physical therapy but he still has some pain with certain reaching maneuvers.  Denies any swelling.  He does take Tylenol occasionally as needed.  Right knee -this has been improving with formalized physical therapy.  He feels like he is ready to transition to HEP on his own.  No swelling.  Pertinent ROS were reviewed with the patient and found to be negative unless otherwise specified above in HPI.   Assessment & Plan: Visit Diagnoses:  1. Chronic left shoulder pain   2. Unilateral primary osteoarthritis, right knee    Plan: Semisi is having an exacerbation of his chronic left shoulder pain which is indicative of rotator cuff tendinopathy.  It has been since April since he underwent glenohumeral joint injection and did very well from this, we did repeat this under ultrasound guidance today.  His right knee pain is doing better as well.  Both for the knee and his shoulders, I am okay with him transitioning from formal physical therapy into her home rehab program.  He is agreeable to this plan.  He may use ice or Tylenol for any postinjection pain.  He will follow-up with me as needed.  Follow-up: Return if symptoms worsen or fail to improve.   Meds & Orders: No orders of the defined types were placed in this encounter.   Orders Placed This Encounter  Procedures   US Guided Needle Placement - No Linked Charges     Procedures: Large Joint Inj: L glenohumeral on 09/12/2023 9:51  AM Indications: pain Details: 22 G 3.5 in needle, ultrasound-guided posterior approach Medications: 2 mL lidocaine 1 %; 2 mL bupivacaine 0.25 %; 80 mg methylPREDNISolone acetate 40 MG/ML Outcome: tolerated well, no immediate complications  US-guided glenohumeral joint injection, left shoulder After discussion on risks/benefits/indications, informed verbal consent was obtained. A timeout was then performed. The patient was positioned lying lateral recumbent on examination table. The patient's shoulder was prepped with betadine and multiple alcohol swabs and utilizing ultrasound guidance, the patient's glenohumeral joint was identified on ultrasound. Using ultrasound guidance a 22-gauge, 3.5 inch needle with a mixture of 2:2:2 cc's lidocaine:bupivicaine:depomedrol was directed from a lateral to medial direction via in-plane technique into the glenohumeral joint with visualization of appropriate spread of injectate into the joint. Patient tolerated the procedure well without immediate complications.      Procedure, treatment alternatives, risks and benefits explained, specific risks discussed. Consent was given by the patient. Immediately prior to procedure a time out was called to verify the correct patient, procedure, equipment, support staff and site/side marked as required. Patient was prepped and draped in the usual sterile fashion.          Clinical History: No specialty comments available.  He reports that he quit smoking about 3 years ago. His smoking use included cigarettes. He started smoking about 47 years ago. He has a 56 pack-year smoking history. He has never used smokeless tobacco. No results for input(s): "HGBA1C", "  LABURIC" in the last 8760 hours.  Objective:    Physical Exam  Gen: Well-appearing, in no acute distress; non-toxic CV: Well-perfused. Warm.  Resp: Breathing unlabored on room air; no wheezing. Psych: Fluid speech in conversation; appropriate affect; normal  thought process Neuro: Sensation intact throughout. No gross coordination deficits.   Ortho Exam - Right knee: No swelling or effusion.  Range of motion is preserved from 0 to estimate 30 degrees.  Nonantalgic gait.  - Left shoulder: There is mild pain at Codman's point.  Full active and passive range of motion.  There is some pain with drop arm and empty can testing.  There is no gross weakness of the rotator cuff.  Imaging: No results found.  Past Medical/Family/Surgical/Social History: Medications & Allergies reviewed per EMR, new medications updated. Patient Active Problem List   Diagnosis Date Noted   Chronic pain of both knees 02/16/2023   S/P laparoscopic fundoplication 07/01/2021   Food bolus obstruction of intestine (HCC)    Gastritis and gastroduodenitis    Duodenal ulcer    Asthma with COPD (HCC) 08/11/2020   Food impaction of esophagus    Dysphagia    Former smoker 08/13/2018   Centrilobular emphysema (HCC) 05/09/2018   Atelectasis    Perforated duodenal ulcer (HCC)    Protein-calorie malnutrition, severe 03/31/2018   Abdominal pain 03/30/2018   Achalasia 08/30/2012   PUD (peptic ulcer disease) 08/15/2012   Past Medical History:  Diagnosis Date   Achalasia    Aortic atherosclerosis (HCC) 04/08/2018   CT scan   Asthma    Duodenal ulcer    duodenal bulb   GERD (gastroesophageal reflux disease)    Hiatal hernia    Hyperplastic colon polyp    Pleural effusion    Stomach ulcer    Family History  Problem Relation Age of Onset   Colon cancer Brother    Diabetes Brother    Diabetes Maternal Aunt    Past Surgical History:  Procedure Laterality Date   BALLOON DILATION N/A 05/14/2019   Procedure: BALLOON DILATION;  Surgeon: Napoleon Form, MD;  [facility]: [facility];  Service: Endoscopy;  Laterality: N/A;  Rigiflex balloon dilation   BIOPSY  08/21/2018   Procedure: BIOPSY;  Surgeon: Beverley Fiedler, MD;  Location: [facility];  Service:  Gastroenterology;;   BIOPSY  02/28/2021   Procedure: BIOPSY;  Surgeon: Shellia Cleverly, DO;  Location: [facility];  Service: Gastroenterology;;   BIOPSY  05/24/2021   Procedure: BIOPSY;  Surgeon: Napoleon Form, MD;  Location: [facility];  Service: Endoscopy;;   BOTOX INJECTION N/A 08/21/2018   Procedure: BOTOX INJECTION;  Surgeon: Beverley Fiedler, MD;  Location: [facility];  Service: Gastroenterology;  Laterality: N/A;   ESOPHAGOGASTRODUODENOSCOPY N/A 02/28/2021   Procedure: ESOPHAGOGASTRODUODENOSCOPY (EGD);  Surgeon: Shellia Cleverly, DO;  Location: [facility] ENDOSCOPY;  Service: Gastroenterology;  Laterality: N/A;   ESOPHAGOGASTRODUODENOSCOPY N/A 05/24/2021   Procedure: ESOPHAGOGASTRODUODENOSCOPY (EGD);  Surgeon: Napoleon Form, MD;  Location: [facility];  Service: Endoscopy;  Laterality: N/A;   ESOPHAGOGASTRODUODENOSCOPY N/A 07/01/2021   Procedure: ESOPHAGOGASTRODUODENOSCOPY (EGD);  Surgeon: Corliss Skains, MD;  Location: [facility] OR;  Service: Thoracic;  Laterality: N/A;   ESOPHAGOGASTRODUODENOSCOPY (EGD) WITH PROPOFOL N/A 08/21/2018   Procedure: ESOPHAGOGASTRODUODENOSCOPY (EGD) WITH PROPOFOL;  Surgeon: Beverley Fiedler, MD;  [facility];  Service: Gastroenterology;  Laterality: N/A;   ESOPHAGOGASTRODUODENOSCOPY (EGD) WITH PROPOFOL N/A 05/14/2019   Procedure: ESOPHAGOGASTRODUODENOSCOPY (EGD) WITH PROPOFOL;  Surgeon: Napoleon Form, MD;  [facility]: [facility];  Service: Endoscopy;  Laterality: N/A;  pneumatic balloon dilation with Gastrografin 2 hours post procedure   FOREIGN BODY REMOVAL  05/14/2019   Procedure: FOREIGN BODY REMOVAL;  Surgeon: Napoleon Form, MD;  Location: [facility];  Service: Endoscopy;;   FOREIGN BODY REMOVAL  05/24/2021   Procedure: FOREIGN BODY REMOVAL;  Surgeon: Napoleon Form, MD;  Location: [facility];  Service: Endoscopy;;   IR RADIOLOGIST EVAL & MGMT  04/24/2018   MYOTOMY  2023   with Nissen fuldoplication   Social  History   Occupational History   Not on file  Tobacco Use   Smoking status: Former    Current packs/day: 0.00    Average packs/day: 1.3 packs/day for 44.8 years (56.0 ttl pk-yrs)    Types: Cigarettes    Start date: 79    Quit date: 08/2020    Years since quitting: 3.0   Smokeless tobacco: Never  Vaping Use   Vaping status: Never Used  Substance and Sexual Activity   Alcohol use: Yes    Comment: 1 beer and 3 shots vodka daily   Drug use: Yes    Types: Marijuana   Sexual activity: Not on file

## 2023-09-13 ENCOUNTER — Encounter: Payer: Medicare Other | Admitting: Rehabilitative and Restorative Service Providers"
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
## 2023-09-14 ENCOUNTER — Ambulatory Visit: Payer: Medicare Other | Admitting: Primary Care

## 2023-09-14 ENCOUNTER — Telehealth: Payer: Self-pay | Admitting: Primary Care

## 2023-09-14 ENCOUNTER — Telehealth: Payer: Self-pay | Admitting: Pulmonary Disease

## 2023-09-14 ENCOUNTER — Encounter: Payer: Self-pay | Admitting: Primary Care

## 2023-09-14 ENCOUNTER — Other Ambulatory Visit: Payer: Self-pay | Admitting: Pulmonary Disease

## 2023-09-14 VITALS — BP 116/73 | HR 85 | Temp 97.8°F | Ht 63.5 in | Wt 112.2 lb

## 2023-09-14 DIAGNOSIS — E43 Unspecified severe protein-calorie malnutrition: Secondary | ICD-10-CM | POA: Diagnosis not present

## 2023-09-14 DIAGNOSIS — Z23 Encounter for immunization: Secondary | ICD-10-CM | POA: Diagnosis not present

## 2023-09-14 DIAGNOSIS — J4489 Other specified chronic obstructive pulmonary disease: Secondary | ICD-10-CM

## 2023-09-14 DIAGNOSIS — Z0000 Encounter for general adult medical examination without abnormal findings: Secondary | ICD-10-CM

## 2023-09-14 MED ORDER — ALBUTEROL SULFATE HFA 108 (90 BASE) MCG/ACT IN AERS
2.0000 | INHALATION_SPRAY | Freq: Four times a day (QID) | RESPIRATORY_TRACT | 6 refills | Status: AC | PRN
Start: 2023-09-14 — End: ?

## 2023-09-14 MED ORDER — SPIRIVA RESPIMAT 2.5 MCG/ACT IN AERS: 2.0000 | INHALATION_SPRAY | Freq: Every day | RESPIRATORY_TRACT | Status: DC

## 2023-09-14 NOTE — Telephone Encounter (Signed)
Patient needs LDCT rescheduled, he had 4B scan in June and needed 3 month follow-up. He had to cancel this

## 2023-09-14 NOTE — Addendum Note (Signed)
Addended by: Delrae Rend on: 09/14/2023 02:14 PM   Modules accepted: Orders

## 2023-09-14 NOTE — Patient Instructions (Addendum)
-  COPD/EMPHYSEMA: COPD and emphysema are chronic lung conditions that cause breathing difficulties. You will continue using your Albuterol inhaler as needed, and we will start you on a daily maintenance inhaler called ANORO to see if it provides better relief. We will also schedule a repeat pulmonary function test to assess your lung function.  -ACHALASIA: Achalasia is a condition that affects the esophagus, making it difficult to swallow and causing a feeling of fullness. If your symptoms worsen, we may refer you back to a GI specialist for reassessment.  -WEIGHT LOSS: Your weight loss is likely due to decreased appetite and early satiety from achalasia. We will monitor your weight and nutritional status to ensure you remain healthy.  -GENERAL HEALTH MAINTENANCE: You are overdue for a colonoscopy, so we will refer you to a GI specialist for this. We will also confirm your flu shot status at your next visit and refer you to internal medicine to establish primary care.  INSTRUCTIONS: Please continue using your Albuterol inhaler as needed and start the daily maintenance inhaler as discussed. We will schedule a repeat pulmonary function test for you. If your achalasia symptoms worsen, please let us know so we can refer you to a GI specialist. We will also arrange for a colonoscopy and confirm your flu shot status at your next visit. Lastly, we will refer you to internal medicine to establish primary care.  Rx: Spiriva Respimat 2.63mcg Albuterol HFA refill   Office treatment:  Due for flu vaccine  Orders: PFTs prior to next visit  Referral: GI for colonoscopy  Internal medicine for PCP   Follow-up: 3 months with Dr. Vassie Loll at [facility] / 30 min PFT prior

## 2023-09-14 NOTE — Telephone Encounter (Signed)
Left patient a VM to call to reschedule follow up LDCT.

## 2023-09-14 NOTE — Telephone Encounter (Signed)
PT making sure Ms Clent Ridges also sent an RX in for his Albuterol. Per AVS notes she should have, but not sent when I look in chart.

## 2023-09-14 NOTE — Telephone Encounter (Signed)
Called and spoke with patient, advised I would send in the refills of his albuterol.  He verbalized understanding.  Verified his pharmacy and refill sent.  Nothing further needed.

## 2023-09-15 ENCOUNTER — Ambulatory Visit (INDEPENDENT_AMBULATORY_CARE_PROVIDER_SITE_OTHER): Payer: Medicare Other | Admitting: Physical Therapy

## 2023-09-15 ENCOUNTER — Encounter: Payer: Self-pay | Admitting: Physical Therapy

## 2023-09-15 DIAGNOSIS — G8929 Other chronic pain: Secondary | ICD-10-CM

## 2023-09-15 DIAGNOSIS — M25511 Pain in right shoulder: Secondary | ICD-10-CM | POA: Diagnosis not present

## 2023-09-15 DIAGNOSIS — M6281 Muscle weakness (generalized): Secondary | ICD-10-CM

## 2023-09-15 DIAGNOSIS — M25512 Pain in left shoulder: Secondary | ICD-10-CM

## 2023-09-15 DIAGNOSIS — M25612 Stiffness of left shoulder, not elsewhere classified: Secondary | ICD-10-CM | POA: Diagnosis not present

## 2023-09-15 DIAGNOSIS — R293 Abnormal posture: Secondary | ICD-10-CM

## 2023-09-15 NOTE — Telephone Encounter (Signed)
NFN 

## 2023-09-15 NOTE — Therapy (Signed)
OUTPATIENT PHYSICAL THERAPY TREATMENT   Patient Name: Raymond Jacobs MRN: [id number] DOB:Feb 21, 1951, 72 y.o., male Today's Date: 09/15/2023      END OF SESSION:  PT End of Session - 09/15/23 0946     Visit Number 4    Number of Visits 6    Date for PT Re-Evaluation 09/28/23    Authorization Type Medicare/Aetna    Progress Note Due on Visit 13   progress note done 13th visit   PT Start Time 0933    PT Stop Time 1012    PT Time Calculation (min) 39 min    Activity Tolerance Patient tolerated treatment well    Behavior During Therapy [facility] for tasks assessed/performed                Past Medical History:  Diagnosis Date   Achalasia    Aortic atherosclerosis (HCC) 04/08/2018   CT scan   Asthma    Duodenal ulcer    duodenal bulb   GERD (gastroesophageal reflux disease)    Hiatal hernia    Hyperplastic colon polyp    Pleural effusion    Stomach ulcer    Past Surgical History:  Procedure Laterality Date   BALLOON DILATION N/A 05/14/2019   Procedure: BALLOON DILATION;  Surgeon: Napoleon Form, MD;  Location: [facility];  Service: Endoscopy;  Laterality: N/A;  Rigiflex balloon dilation   BIOPSY  08/21/2018   Procedure: BIOPSY;  Surgeon: Beverley Fiedler, MD;  Location: [facility];  Service: Gastroenterology;;   BIOPSY  02/28/2021   Procedure: BIOPSY;  Surgeon: Shellia Cleverly, DO;  Location: [facility];  Service: Gastroenterology;;   BIOPSY  05/24/2021   Procedure: BIOPSY;  Surgeon: Napoleon Form, MD;  Location: [facility];  Service: Endoscopy;;   BOTOX INJECTION N/A 08/21/2018   Procedure: BOTOX INJECTION;  Surgeon: Beverley Fiedler, MD;  Location: [facility];  Service: Gastroenterology;  Laterality: N/A;   ESOPHAGOGASTRODUODENOSCOPY N/A 02/28/2021   Procedure: ESOPHAGOGASTRODUODENOSCOPY (EGD);  Surgeon: Shellia Cleverly, DO;  Location[facility];  Service: Gastroenterology;  Laterality: N/A;   ESOPHAGOGASTRODUODENOSCOPY N/A 05/24/2021    Procedure: ESOPHAGOGASTRODUODENOSCOPY (EGD);  Surgeon: Napoleon Form, MD;  Location: [facility];  Service: Endoscopy;  Laterality: N/A;   ESOPHAGOGASTRODUODENOSCOPY N/A 07/01/2021   Procedure: ESOPHAGOGASTRODUODENOSCOPY (EGD);  Surgeon: Corliss Skains, MD;  Location: [facility] OR;  Service: Thoracic;  Laterality: N/A;   ESOPHAGOGASTRODUODENOSCOPY (EGD) WITH PROPOFOL N/A 08/21/2018   Procedure: ESOPHAGOGASTRODUODENOSCOPY (EGD) WITH PROPOFOL;  Surgeon: Beverley Fiedler, MD;  [facility]: [facility];  Service: Gastroenterology;  Laterality: N/A;   ESOPHAGOGASTRODUODENOSCOPY (EGD) WITH PROPOFOL N/A 05/14/2019   Procedure: ESOPHAGOGASTRODUODENOSCOPY (EGD) WITH PROPOFOL;  Surgeon: Napoleon Form, MD;  Location: [facility];  Service: Endoscopy;  Laterality: N/A;  pneumatic balloon dilation with Gastrografin 2 hours post procedure   FOREIGN BODY REMOVAL  05/14/2019   Procedure: FOREIGN BODY REMOVAL;  Surgeon: Napoleon Form, MD;  [facility]: [facility];  Service: Endoscopy;;   FOREIGN BODY REMOVAL  05/24/2021   Procedure: FOREIGN BODY REMOVAL;  Surgeon: Napoleon Form, MD;  Location: [facility];  Service: Endoscopy;;   IR RADIOLOGIST EVAL & MGMT  04/24/2018   MYOTOMY  2023   with Nissen fuldoplication   Patient Active Problem List   Diagnosis Date Noted   Chronic pain of both knees 02/16/2023   S/P laparoscopic fundoplication 07/01/2021   Food bolus obstruction of intestine (HCC)    Gastritis and gastroduodenitis    Duodenal ulcer    Asthma  with COPD (HCC) 08/11/2020   Food impaction of esophagus    Dysphagia    Former smoker 08/13/2018   Centrilobular emphysema (HCC) 05/09/2018   Atelectasis    Perforated duodenal ulcer (HCC)    Protein-calorie malnutrition, severe 03/31/2018   Abdominal pain 03/30/2018   Achalasia 08/30/2012   PUD (peptic ulcer disease) 08/15/2012    PCP: Pcp, No  REFERRING PROVIDER: Madelyn Brunner, DO  REFERRING DIAG: M25.511,G89.29,M25.512  (ICD-10-CM) - Chronic pain of both shoulders  Rationale for Evaluation and Treatment: Rehabilitation  THERAPY DIAG:  Abnormal posture  Chronic pain of both shoulders  Muscle weakness (generalized)  Stiffness of left shoulder, not elsewhere classified  ONSET DATE: 3 months   SUBJECTIVE:                                                                                                                                                                                           SUBJECTIVE STATEMENT:  Shoulder is feeling good, sleepy this morning. Chicken wing move is hard/not natural for me but shoulder feels good. Pushups have been feeling good, have been progressing the   PERTINENT HISTORY:  COPD, asthma  PAIN:  Are you having pain? Yes: NPRS scale: 0/10 Pain location: Lt shoulder Pain description:   Aggravating factors:    Relieving factors:    PRECAUTIONS:  None  RED FLAGS: None   WEIGHT BEARING RESTRICTIONS:  No  FALLS:  Has patient fallen in last 6 months? No  LIVING ENVIRONMENT: Lives with: lives alone Lives in: House/apartment  OCCUPATION:  Retired from [employer]  PLOF:  Independent and Leisure: exercise - long walk, then jump roping and bands, sit ups, core, wall juggle, plays music  PATIENT GOALS:  Mobility and improved pain   OBJECTIVE:   DIAGNOSTIC FINDINGS:  X-rays: negative  PATIENT SURVEYS:  08/17/23 FOTO 65 (predicted 71) ; 09/08/23- 63  COGNITIVE STATUS: Within functional limits for tasks assessed   SENSATION: WFL  POSTURE:  rounded shoulders and forward head  HAND DOMINANCE:  Left  GAIT: 08/17/23 Comments: independent  PALPATION: 08/17/23 some discomfort proximal biceps tendon; infraspinatus trigger points, but do not appear to be active  UPPER EXTREMITY ROM:  All measured in sitting  ROM Right eval Left eval  Shoulder flexion WNL WNL  Shoulder extension    Shoulder abduction WNL WNL  Shoulder adduction     Shoulder extension    Shoulder internal rotation FIR:  T12/L1 FIR:  T12/L1  Shoulder external rotation WNL WNL   (Blank rows = not tested)   UPPER EXTREMITY MMT:  MMT Right eval Left eval Right 09/08/23 Left 09/08/23  Shoulder flexion 4/5  3/5 4+ 4+  Shoulder abduction 4/5 3/5 4+ 4  Shoulder internal rotation 5/5 4/5 5 4+  Shoulder external rotation 4/5 3+/5 4+ 4+   (Blank rows = not tested)    SPECIAL TESTS:  08/17/23 Upper Extremity Impingement tests: Hawkins/Kennedy impingement test: positive  on left   TREATMENT:                                                                                                                              DATE:    09/15/23  TherEx  UBE L4.2 x5 minutes backwards only Wall ball alphabet x3 rounds B (alternating) Body blade elbow bent vertical 2x30 seconds, horizontal 2x20 seconds  Blackburn 6 x10 each Knee pushups x10 horizontal on mat table  Standard pushups x3 horizontal on mat table- stopped early, poor form due to fatigue with standard pushups  Rows blue TB x12 Shoulder extensions blue TB x12  Shoulder ER green TB x15 B ongoing cues for form  Modified tricep dips from chair x10           09/08/23  FOTO update, objective measures/goal review/discussion of progress and goals prior to MD visit 09/12/23  UBE L4 x6 minutes backwards only  Counter push-ups x10 Wall alphabet with ball 2 rounds  Shoulder flexion on wall 2# x10 Shoulder ABD on wall 2#x10  Wall ball circles 2x10 CW/2x10 CCW Red TB reverse flies with scap retraction 12x3 second holds standing     09/01/23  TherEx  Scap retractions green TB 12x3 seconds (2 rounds) Shoulder extensions green TB 12x3 seconds (2 rounds) Shoulder ER green TB (1 strap) x12 B elbow tucked to ribs UBE backwards only for postural mm activation x5 minutes L4 Supine shoulder strengthening: flexion 3# 2x10, serratus punches 3# 2x10, shoulder ABD 3# x10, serratus punch + CW/CCW  circles x10 each Thoracic ROM x15 each direction Wall pushups x10 BOS WNL     08/17/23 See HEP - demonstrated with trial reps performed PRN for comprehension, mod cues needed for technique     PATIENT EDUCATION:  Education details: HEP Person educated: Patient Education method: Programmer, multimedia, Demonstration, and Handouts Education comprehension: verbalized understanding, returned demonstration, and needs further education  HOME EXERCISE PROGRAM:  Access Code: [email or web address] + blackburn 6 exercises (given 09/15/23) URL: [email or web address] Date: 09/08/2023 Prepared by: Nedra Hai  Exercises - Shoulder External Rotation and Scapular Retraction with Resistance  - 1 x daily - 7 x weekly - 3 sets - 10 reps - 5 sec hold - Standing Shoulder Row with Anchored Resistance  - 1 x daily - 7 x weekly - 3 sets - 10 reps - Standing Shoulder Flexion to 90 Degrees with Dumbbells  - 1 x daily - 7 x weekly - 3 sets - 10 reps - Shoulder Abduction with Dumbbells - Thumbs Up  - 1 x daily - 7 x weekly - 3 sets - 10 reps - Standing Shoulder Internal Rotation Stretch with Towel  -  2 x daily - 7 x weekly - 1 sets - 10 reps - 10 sec hold - Push-Up on Counter  - 1 x daily - 7 x weekly - 1 sets - 10 reps - Shoulder Alphabet with Ball at Wall  - 1 x daily - 7 x weekly - 1 sets - 2-3 reps  ASSESSMENT:  CLINICAL IMPRESSION:  09/15/23  Pt arrives doing well today, we continued progression of functional strength as per POC. Introduced blackburn 6 and progressed pushup angle today with good tolerance. Remains very motivated to improve but would benefit from further targeted strengthening L UE.     EVAL:Patient is a 72 y.o. male who was seen today for physical therapy evaluation and treatment for bil shoulder pain. He demonstrates mild ROM and strength deficits as well as postural abnormalities affecting functional mobility.  He will benefit from PT to address deficits listed.   OBJECTIVE  IMPAIRMENTS: decreased ROM, decreased strength, increased fascial restrictions, increased muscle spasms, impaired UE functional use, and pain.   ACTIVITY LIMITATIONS: carrying, lifting, bathing, reach over head, and hygiene/grooming  PARTICIPATION LIMITATIONS: meal prep, cleaning, community activity, and yard work  PERSONAL FACTORS: Past/current experiences and 1-2 comorbidities: asthma, COPD  are also affecting patient's functional outcome.   REHAB POTENTIAL: Good  CLINICAL DECISION MAKING: Stable/uncomplicated  EVALUATION COMPLEXITY: Low   GOALS: Goals reviewed with patient? Yes  SHORT TERM GOALS: Target date: 09/07/2023  Independent with initial HEP Goal status: MET 09/08/23   LONG TERM GOALS: Target date: 09/28/2023  Independent with final HEP Goal status: ONGOING 09/08/23  2.  FOTO score improved to 71 Goal status: ONGOING 09/08/23  3.  Bil shoulder strength improved to 5/5 for improved function Goal status: ONGOING 09/08/23  4.  Report pain < 3/10 with reaching activities for improved function Goal status: PARTIALLY MET 09/08/23- can reach 5-6/10 but rarely, usually sits at 1/10 on a normal day    PLAN:  PT FREQUENCY: 1x/week  PT DURATION: 6 weeks  PLANNED INTERVENTIONS: 97164- PT Re-evaluation, 97110-Therapeutic exercises, 97530- Therapeutic activity, 97112- Neuromuscular re-education, 97535- Self Care, 29562- Manual therapy, [id number]- Aquatic Therapy, 97014- Electrical stimulation (unattended), [id number]- Ultrasound, 13086- Ionotophoresis 4mg /ml Dexamethasone, Patient/Family education, Taping, Dry Needling, Joint mobilization, Cryotherapy, and Moist heat.  PLAN FOR NEXT SESSION: Possible DC to advanced HEP vs one additional session- how does he feel?    NEXT MD VISIT: unscheduled   Nedra Hai, PT, DPT 09/15/23 10:12 AM

## 2023-09-18 ENCOUNTER — Encounter: Payer: Self-pay | Admitting: Rehabilitative and Restorative Service Providers"

## 2023-09-18 ENCOUNTER — Ambulatory Visit (INDEPENDENT_AMBULATORY_CARE_PROVIDER_SITE_OTHER): Payer: Medicare Other | Admitting: Rehabilitative and Restorative Service Providers"

## 2023-09-18 DIAGNOSIS — G8929 Other chronic pain: Secondary | ICD-10-CM

## 2023-09-18 DIAGNOSIS — M25512 Pain in left shoulder: Secondary | ICD-10-CM

## 2023-09-18 DIAGNOSIS — M25511 Pain in right shoulder: Secondary | ICD-10-CM

## 2023-09-18 DIAGNOSIS — R293 Abnormal posture: Secondary | ICD-10-CM | POA: Diagnosis not present

## 2023-09-18 DIAGNOSIS — M6281 Muscle weakness (generalized): Secondary | ICD-10-CM | POA: Diagnosis not present

## 2023-09-18 NOTE — Therapy (Signed)
OUTPATIENT PHYSICAL THERAPY TREATMENT/PROGRESS NOTE   Patient Name: Raymond Jacobs MRN: [id number] DOB:[birth year], 72 y.o., male Today's Date: 09/18/2023  Progress Note Reporting Period 08/17/2023 to 09/18/2023  See note below for Objective Data and Assessment of Progress/Goals.     END OF SESSION:  PT End of Session - 09/18/23 0848     Visit Number 5    Number of Visits 6    Date for PT Re-Evaluation 09/28/23    Authorization Type Medicare/Aetna    Progress Note Due on Visit 13   progress note done 13th visit   PT Start Time 0848    PT Stop Time 0932    PT Time Calculation (min) 44 min    Activity Tolerance Patient tolerated treatment well;No increased pain    Behavior During Therapy Raymond Jacobs for tasks assessed/performed             Past Medical History:  Diagnosis Date   Achalasia    Aortic atherosclerosis (HCC) 04/08/2018   CT scan   Asthma    Duodenal ulcer    duodenal bulb   GERD (gastroesophageal reflux disease)    Hiatal hernia    Hyperplastic colon polyp    Pleural effusion    Stomach ulcer    Past Surgical History:  Procedure Laterality Date   BALLOON DILATION N/A 05/14/2019   Procedure: BALLOON DILATION;  Surgeon: Napoleon Form, MD;  Location: [facility];  Service: Endoscopy;  Laterality: N/A;  Rigiflex balloon dilation   BIOPSY  08/21/2018   Procedure: BIOPSY;  Surgeon: Beverley Fiedler, MD;  Location: [facility];  Service: Gastroenterology;;   BIOPSY  02/28/2021   Procedure: BIOPSY;  Surgeon: Shellia Cleverly, DO;  Location: [facility];  Service: Gastroenterology;;   BIOPSY  05/24/2021   Procedure: BIOPSY;  Surgeon: Napoleon Form, MD;  Location: [facility];  Service: Endoscopy;;   BOTOX INJECTION N/A 08/21/2018   Procedure: BOTOX INJECTION;  Surgeon: Beverley Fiedler, MD;  Location: [facility];  Service: Gastroenterology;  Laterality: N/A;   ESOPHAGOGASTRODUODENOSCOPY N/A 02/28/2021   Procedure: ESOPHAGOGASTRODUODENOSCOPY (EGD);   Surgeon: Shellia Cleverly, DO;  [facility];  Service: Gastroenterology;  Laterality: N/A;   ESOPHAGOGASTRODUODENOSCOPY N/A 05/24/2021   Procedure: ESOPHAGOGASTRODUODENOSCOPY (EGD);  Surgeon: Napoleon Form, MD;  Location: [facility];  Service: Endoscopy;  Laterality: N/A;   ESOPHAGOGASTRODUODENOSCOPY N/A 07/01/2021   Procedure: ESOPHAGOGASTRODUODENOSCOPY (EGD);  Surgeon: Corliss Skains, MD;  Location: [facility];  Service: Thoracic;  Laterality: N/A;   ESOPHAGOGASTRODUODENOSCOPY (EGD) WITH PROPOFOL N/A 08/21/2018   Procedure: ESOPHAGOGASTRODUODENOSCOPY (EGD) WITH PROPOFOL;  Surgeon: Beverley Fiedler, MD;  [facility]: [facility];  Service: Gastroenterology;  Laterality: N/A;   ESOPHAGOGASTRODUODENOSCOPY (EGD) WITH PROPOFOL N/A 05/14/2019   Procedure: ESOPHAGOGASTRODUODENOSCOPY (EGD) WITH PROPOFOL;  Surgeon: Napoleon Form, MD;  [facility]: [facility];  Service: Endoscopy;  Laterality: N/A;  pneumatic balloon dilation with Gastrografin 2 hours post procedure   FOREIGN BODY REMOVAL  05/14/2019   Procedure: FOREIGN BODY REMOVAL;  Surgeon: Napoleon Form, MD;  [facility]: [facility];  Service: Endoscopy;;   FOREIGN BODY REMOVAL  05/24/2021   Procedure: FOREIGN BODY REMOVAL;  Surgeon: Napoleon Form, MD;  Location: [facility];  Service: Endoscopy;;   IR RADIOLOGIST EVAL & MGMT  04/24/2018   MYOTOMY  2023   with Nissen fuldoplication   Patient Active Problem List   Diagnosis Date Noted   Chronic pain of both knees 02/16/2023   S/P laparoscopic fundoplication 07/01/2021   Food bolus obstruction  of intestine (HCC)    Gastritis and gastroduodenitis    Duodenal ulcer    Asthma with COPD (HCC) 08/11/2020   Food impaction of esophagus    Dysphagia    Former smoker 08/13/2018   Centrilobular emphysema (HCC) 05/09/2018   Atelectasis    Perforated duodenal ulcer (HCC)    Protein-calorie malnutrition, severe 03/31/2018   Abdominal pain 03/30/2018   Achalasia  08/30/2012   PUD (peptic ulcer disease) 08/15/2012    PCP: Pcp, No  REFERRING PROVIDER: Madelyn Brunner, DO  REFERRING DIAG: M25.511,G89.29,M25.512 (ICD-10-CM) - Chronic pain of both shoulders  Rationale for Evaluation and Treatment: Rehabilitation  THERAPY DIAG:  Abnormal posture - Plan: PT plan of care cert/re-cert  Chronic pain of both shoulders - Plan: PT plan of care cert/re-cert  Muscle weakness (generalized) - Plan: PT plan of care cert/re-cert  ONSET DATE: 3 months   SUBJECTIVE:                                                                                                                                                                                           SUBJECTIVE STATEMENT: Raymond Jacobs notes progress with his bilateral shoulder pain since starting PT.  Although better, he still has hopes of further improvement.  PERTINENT HISTORY:  COPD, asthma  PAIN:  Are you having pain? Yes: NPRS scale: 0-3/10 Pain location: Lt shoulder Pain description: Ache Aggravating factors:   Reaching and overhead function (with resistance/weights) Relieving factors: PT exercises  PRECAUTIONS:  None  RED FLAGS: None   WEIGHT BEARING RESTRICTIONS:  No  FALLS:  Has patient fallen in last 6 months? No  LIVING ENVIRONMENT: Lives with: lives alone Lives in: House/apartment  OCCUPATION:  Retired from [employer]  PLOF:  Independent and Leisure: exercise - long walk, then jump roping and bands, sit ups, core, wall juggle, plays music  PATIENT GOALS:  Mobility and improved pain   OBJECTIVE:   DIAGNOSTIC FINDINGS:  X-rays: negative  PATIENT SURVEYS:  09/18/2023 FOTO 63  08/17/23 FOTO 65 (predicted 71) ; 09/08/23- 63  COGNITIVE STATUS: Within functional limits for tasks assessed   SENSATION: WFL  POSTURE:  rounded shoulders and forward head  HAND DOMINANCE:  Left  GAIT: 08/17/23 Comments: independent  PALPATION: 08/17/23 some discomfort proximal biceps  tendon; infraspinatus trigger points, but do not appear to be active  UPPER EXTREMITY ROM:  All measured in sitting  ROM Right eval Left eval Left/Right 09/18/2023  Shoulder flexion WNL WNL 170/160  Shoulder extension     Shoulder abduction WNL WNL 40/30  Shoulder horizontal adduction     Shoulder extension     Shoulder internal rotation  FIR:  T12/L1 FIR:  T12/L1 30/35  Shoulder external rotation WNL WNL 105/105   (Blank rows = not tested)   UPPER EXTREMITY MMT:  MMT Right eval Left eval Right 09/08/23 Left 09/08/23 Left/Right 09/18/2023  Shoulder flexion 4/5 3/5 4+ 4+   Shoulder abduction 4/5 3/5 4+ 4   Shoulder internal rotation 5/5 4/5 5 4+ 26.2/25.5  Shoulder external rotation 4/5 3+/5 4+ 4+ 18.4/15.5   (Blank rows = not tested)    SPECIAL TESTS:  08/17/23 Upper Extremity Impingement tests: Hawkins/Kennedy impingement test: positive  on left   TREATMENT:                                                                                                                              DATE:  09/18/2023 Thera-Band ER Shoulder Green 15 x bilateral Thera-Band Blue Pull to chest/Rows 20 x 3 seconds Shoulder IR stretch supine 10 x 10 seconds bilateral Thumb up the back stretch 10 x 10 seconds bilateral  Re-assessment and HEP update   09/15/23 TherEx  UBE L4.2 x5 minutes backwards only Wall ball alphabet x3 rounds B (alternating) Body blade elbow bent vertical 2x30 seconds, horizontal 2x20 seconds  Blackburn 6 x10 each Knee pushups x10 horizontal on mat table  Standard pushups x3 horizontal on mat table- stopped early, poor form due to fatigue with standard pushups  Rows blue TB x12 Shoulder extensions blue TB x12  Shoulder ER green TB x15 B ongoing cues for form  Modified tricep dips from chair x10    09/08/23 FOTO update, objective measures/goal review/discussion of progress and goals prior to MD visit 09/12/23  UBE L4 x6 minutes backwards only  Counter  push-ups x10 Wall alphabet with ball 2 rounds  Shoulder flexion on wall 2# x10 Shoulder ABD on wall 2#x10  Wall ball circles 2x10 CW/2x10 CCW Red TB reverse flies with scap retraction 12x3 second holds standing   PATIENT EDUCATION:  Education details: HEP Person educated: Patient Education method: Programmer, multimedia, Facilities manager, and Handouts Education comprehension: verbalized understanding, returned demonstration, and needs further education  HOME EXERCISE PROGRAM: Access Code: [email or web address] URL: [email or web address] Date: 09/18/2023 Prepared by: Pauletta Browns  Exercises - Shoulder External Rotation and Scapular Retraction with Resistance  - 1 x daily - 7 x weekly - 3 sets - 10 reps - 5 sec hold - Standing Shoulder Row with Anchored Resistance  - 1 x daily - 7 x weekly - 3 sets - 10 reps - Standing Shoulder Flexion to 90 Degrees with Dumbbells  - 1 x daily - 7 x weekly - 3 sets - 10 reps - Shoulder Abduction with Dumbbells - Thumbs Up  - 1 x daily - 7 x weekly - 3 sets - 10 reps - Standing Shoulder Internal Rotation Stretch with Towel  - 2 x daily - 7 x weekly - 1 sets - 10 reps - 10 sec hold - Push-Up on Counter  - 1 x daily - 7 x  weekly - 1 sets - 10 reps - Shoulder Alphabet with Ball at Wall  - 1 x daily - 7 x weekly - 1 sets - 2-3 reps - Supine Shoulder Internal Rotation Stretch  - 1 x daily - 7 x weekly - 1 sets - 10 reps - 10 seconds hold - Shoulder External Rotation with Anchored Resistance  - 1 x daily - 7 x weekly - 2 sets - 10 reps - 3 hold - Standing Shoulder Internal Rotation Stretch with Hands Behind Back  - 1 x daily - 7 x weekly - 1 sets - 10 reps - 10 seconds hold  ASSESSMENT:  CLINICAL IMPRESSION:  09/18/23 Raymond Jacobs notes significant progress in his bilateral shoulder pain since starting physical therapy.  FOTO score is not impressive, although Raymond Jacobs does mention subjective progress with his function since starting PT.  Objectively, continuing to strengthen his  scapular and rotator cuff musculature along with stretching his posterior capsule should help Raymond Jacobs meet all long-term goals and prepare him for completely independent rehabilitation.  Raymond Jacobs and I discussed his options and he chose to attend 1 additional visit in 2 weeks after a trial with some minor changes to his home exercise program to see if this might get him over the hump to where he wants to be before transferring into doing his exercises independently.   EVAL:Patient is a 72 y.o. male who was seen today for physical therapy evaluation and treatment for bil shoulder pain. He demonstrates mild ROM and strength deficits as well as postural abnormalities affecting functional mobility.  He will benefit from PT to address deficits listed.   OBJECTIVE IMPAIRMENTS: decreased ROM, decreased strength, increased fascial restrictions, increased muscle spasms, impaired UE functional use, and pain.   ACTIVITY LIMITATIONS: carrying, lifting, bathing, reach over head, and hygiene/grooming  PARTICIPATION LIMITATIONS: meal prep, cleaning, community activity, and yard work  PERSONAL FACTORS: Past/current experiences and 1-2 comorbidities: asthma, COPD  are also affecting patient's functional outcome.   REHAB POTENTIAL: Good  CLINICAL DECISION MAKING: Stable/uncomplicated  EVALUATION COMPLEXITY: Low   GOALS: Goals reviewed with patient? Yes  SHORT TERM GOALS: Target date: 09/07/2023  Independent with initial HEP Goal status: MET 09/08/23   LONG TERM GOALS: Target date: 10/29/2023  Independent with final HEP Goal status: ONGOING 09/18/23  2.  FOTO score improved to 71 Goal status: ONGOING 09/18/23  3.  Bil shoulder strength improved to 5/5 for improved function Goal status: ONGOING 09/18/23  4.  Report pain < 3/10 with reaching activities for improved function Goal status: PARTIALLY MET 09/18/23- can reach 3/10 but rarely, usually sits at 1/10 on a normal day    PLAN:  PT FREQUENCY:  1-4 visits  PT DURATION: 1-4 visits in a month  PLANNED INTERVENTIONS: 97164- PT Re-evaluation, 97110-Therapeutic exercises, 97530- Therapeutic activity, 97112- Neuromuscular re-education, 97535- Self Care, 84166- Manual therapy, [id number]- Aquatic Therapy, 97014- Electrical stimulation (unattended), 97035- Ultrasound, 06301- Ionotophoresis 4mg /ml Dexamethasone, Patient/Family education, Taping, Dry Needling, Joint mobilization, Cryotherapy, and Moist heat.  PLAN FOR NEXT SESSION: Follow-up in 2 weeks after trial of independent rehabilitation with an updated home exercise program.   NEXT MD VISIT: unscheduled   Cherlyn Cushing PT, MPT 09/18/23 5:04 PM

## 2023-09-29 ENCOUNTER — Encounter: Payer: Self-pay | Admitting: Gastroenterology

## 2023-10-09 NOTE — Telephone Encounter (Signed)
Unable to reach by phone. Letter mailed to home for scheduling follow up CT scan

## 2023-11-15 ENCOUNTER — Telehealth: Payer: Self-pay

## 2023-11-15 ENCOUNTER — Ambulatory Visit: Payer: Medicare Other

## 2023-11-15 VITALS — Ht 63.5 in | Wt 115.0 lb

## 2023-11-15 DIAGNOSIS — Z1211 Encounter for screening for malignant neoplasm of colon: Secondary | ICD-10-CM

## 2023-11-15 MED ORDER — SUFLAVE 178.7 G PO SOLR: 1.0000 | Freq: Once | ORAL | 0 refills | Status: AC

## 2023-11-15 NOTE — Telephone Encounter (Signed)
Left message that we had a pre visit appt at 10:30 and I would call back in 5 min to try and reach him.

## 2023-11-15 NOTE — Telephone Encounter (Signed)
Completed pre visit

## 2023-11-15 NOTE — Progress Notes (Signed)
No egg or soy allergy known to patient  No issues known to pt with past sedation with any surgeries or procedures Patient denies ever being told they had issues or difficulty with intubation  No FH of Malignant Hyperthermia Pt is not on diet pills Pt is not on  home 02  Pt is not on blood thinners  Pt denies issues with constipation  No A fib or A flutter Have any cardiac testing pending--no Pt can ambulate indpendently Pt denies use of chewing tobacco Discussed diabetic I weight loss medication holds Discussed NSAID holds Checked BMI Pt instructed to use [employer] or GoodRx for a price reduction on prep  Patient's chart reviewed by Cathlyn Parsons CNRA prior to previsit and patient appropriate for the [facility].  Pre visit completed and red dot placed by patient's name on their procedure day (on provider's schedule).

## 2023-11-24 ENCOUNTER — Encounter: Payer: Self-pay | Admitting: *Deleted

## 2023-11-24 ENCOUNTER — Telehealth: Payer: Self-pay | Admitting: *Deleted

## 2023-11-24 NOTE — Telephone Encounter (Signed)
Attempted to contact pt but unable to leave voicemail to scheduled follow up lung screening. Letter mailed to pt asking him to contact us to schedule.

## 2023-11-28 ENCOUNTER — Telehealth: Payer: Self-pay | Admitting: Gastroenterology

## 2023-11-28 NOTE — Telephone Encounter (Signed)
 Please see note   Thank you

## 2023-11-28 NOTE — Telephone Encounter (Signed)
 Ok

## 2023-11-28 NOTE — Telephone Encounter (Signed)
Inbound call from patient, calling to cancel his procedure for tomorrow at 11:00 AM, he states he believes he has the flu. He is achy, fever, and sore throat. He states he will call back to reschedule at a later time.

## 2023-11-29 ENCOUNTER — Encounter: Payer: Medicare Other | Admitting: Gastroenterology

## 2023-12-05 NOTE — Telephone Encounter (Signed)
Attempted to contact pt again but unable to leave a voicemail. Called and spoke with pt's brother, Brett Canales ([place]). He sent patient a text asking him to call us to schedule CT scan.

## 2023-12-25 ENCOUNTER — Encounter (HOSPITAL_BASED_OUTPATIENT_CLINIC_OR_DEPARTMENT_OTHER): Payer: Self-pay | Admitting: Pulmonary Disease

## 2023-12-25 ENCOUNTER — Encounter (HOSPITAL_BASED_OUTPATIENT_CLINIC_OR_DEPARTMENT_OTHER): Payer: Medicare Other

## 2023-12-25 ENCOUNTER — Ambulatory Visit (HOSPITAL_BASED_OUTPATIENT_CLINIC_OR_DEPARTMENT_OTHER): Payer: Medicare Other | Admitting: Pulmonary Disease

## 2023-12-25 VITALS — BP 122/76 | HR 78 | Ht 63.5 in | Wt 114.4 lb

## 2023-12-25 DIAGNOSIS — J4489 Other specified chronic obstructive pulmonary disease: Secondary | ICD-10-CM | POA: Diagnosis not present

## 2023-12-25 DIAGNOSIS — R918 Other nonspecific abnormal finding of lung field: Secondary | ICD-10-CM | POA: Diagnosis not present

## 2023-12-25 DIAGNOSIS — J439 Emphysema, unspecified: Secondary | ICD-10-CM | POA: Diagnosis not present

## 2023-12-25 DIAGNOSIS — J432 Centrilobular emphysema: Secondary | ICD-10-CM

## 2023-12-25 LAB — PULMONARY FUNCTION TEST
DL/VA % pred: 71 %
DL/VA: 2.96 ml/min/mmHg/L
DLCO cor % pred: 81 %
DLCO cor: 16.92 ml/min/mmHg
DLCO unc % pred: 81 %
DLCO unc: 16.92 ml/min/mmHg
FEF 25-75 Post: 1.94 L/s
FEF 25-75 Pre: 1.4 L/s
FEF2575-%Change-Post: 38 %
FEF2575-%Pred-Post: 107 %
FEF2575-%Pred-Pre: 77 %
FEV1-%Change-Post: 12 %
FEV1-%Pred-Post: 111 %
FEV1-%Pred-Pre: 99 %
FEV1-Post: 2.66 L
FEV1-Pre: 2.37 L
FEV1FVC-%Change-Post: 6 %
FEV1FVC-%Pred-Pre: 89 %
FEV6-%Change-Post: 4 %
FEV6-%Pred-Post: 122 %
FEV6-%Pred-Pre: 118 %
FEV6-Post: 3.78 L
FEV6-Pre: 3.64 L
FEV6FVC-%Change-Post: -1 %
FEV6FVC-%Pred-Post: 105 %
FEV6FVC-%Pred-Pre: 107 %
FVC-%Change-Post: 5 %
FVC-%Pred-Post: 116 %
FVC-%Pred-Pre: 109 %
FVC-Post: 3.84 L
FVC-Pre: 3.64 L
Post FEV1/FVC ratio: 69 %
Post FEV6/FVC ratio: 99 %
Pre FEV1/FVC ratio: 65 %
Pre FEV6/FVC Ratio: 100 %
RV % pred: 123 %
RV: 2.63 L
TLC % pred: 114 %
TLC: 6.56 L

## 2023-12-25 NOTE — Progress Notes (Signed)
 Subjective:    Patient ID: Raymond Jacobs, male    DOB: Jun 03, 1951, 73 y.o.   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 He was last seen by me 04/2018   PMH -  03/2020 hosp for achalasia and duodenal ulcer perforation, developed perisplenic fluid collection requiring drainage  He was discharged on oxygen 2 L He works as a Education administrator and smoked about a pack per day for more than 40 years, quit 08/2020.  He also drinks 2 beers per day He had laparoscopic Heller myotomy +fundoplication with Dr. Cliffton Asters in Sep 2022   73 year old with a history of emphysema, presents for a follow-up visit after an abnormal CT scan in June 2024 showed lung nodules. Despite the diagnosis of emphysema, the patient reports feeling well for his age and continues to engage in physical activities such as biking and walking. However, he admits to a decrease in activity during colder weather due to his low body weight and difficulty retaining heat.  The patient reports a history of smoking, having quit for two and a half years before relapsing. Currently, he smokes nearly a pack of cigar cigarettes a day, trying not to inhale too much. He also has a history of exposure to toxic substances, including dust, paint, asbestos, and fiberglass insulation, due to his work environment.  In addition to emphysema, the patient has been dealing with an esophagus problem, which may have contributed to his weight loss. He reports a change in his bowel habits, going to the bathroom three to four times a day compared to once in the morning previously. Despite these health issues, the patient remains active and tries to maintain a healthy diet.  Significant tests/ events reviewed  LDCT chest 03/2023 >> New nodules are present, largest of which in the left lower lobe 7mm  Lung-RADS 4AS ,Patulous debris-filled esophagus     Spirometry 04/2018 mild airway obstruction with ratio 66, FEV1 83% and FVC 93%   08/11/2020  PFTs- FVC 4.05 (113%), FEV1 2.93 (112%), ratio 72, TLC 103%, DLCOunc 15.59 (71%)  PFTs 12/2023 >> ratio 65, minimal airway obs, DLCO 81%   08/11/2020 FENO - 69   09/12/20- Eos absolute 800   08/11/20 IgE 213  Review of Systems neg for any significant sore throat, dysphagia, itching, sneezing, nasal congestion or excess/ purulent secretions, fever, chills, sweats, unintended wt loss, pleuritic or exertional cp, hempoptysis, orthopnea pnd or change in chronic leg swelling. Also denies presyncope, palpitations, heartburn, abdominal pain, nausea, vomiting, diarrhea or change in bowel or urinary habits, dysuria,hematuria, rash, arthralgias, visual complaints, headache, numbness weakness or ataxia.     Objective:   Physical Exam  Gen. Pleasant, well-nourished, in no distress ENT - no thrush, no pallor/icterus,no post nasal drip Neck: No JVD, no thyromegaly, no carotid bruits Lungs: no use of accessory muscles, no dullness to percussion, clear without rales or rhonchi  Cardiovascular: Rhythm regular, heart sounds  normal, no murmurs or gallops, no peripheral edema Musculoskeletal: No deformities, no cyanosis or clubbing        Assessment & Plan:    Emphysema Chronic emphysema with resumed smoking, currently at nearly a pack of cigar cigarettes daily. PFT shows normal lung function except for lung lining function at 80%. Discussed smoking cessation to prevent further decline in lung function. - Encourage smoking cessation - Continue albuterol inhaler as needed (1-2 times daily)  Lung Nodules Small lung nodules identified on CT scan in June 2024. Discussed potential  lung cancer risk due to smoking history and emphasized the importance of follow-up imaging for early detection and treatment options. - Schedule follow-up low-dose CT chest scan  Esophageal Issues Improved esophageal symptoms but increased bowel movement frequency. Weight loss may be partially due to esophageal issues.  Managing diet with healthy food choices. - Monitor esophageal symptoms and bowel habits - Encourage continued healthy eating habits  General Health Maintenance Discussed smoking's impact on overall health, including heart health and potential for calcium buildup. Encouraged continued physical activity. - Encourage continued physical activity - Discuss risks of smoking on heart health  Follow-up - Schedule follow-up low-dose CT chest scan - Discuss follow-up scan options

## 2023-12-25 NOTE — Patient Instructions (Signed)
 Full PFT Performed Today

## 2023-12-25 NOTE — Patient Instructions (Addendum)
 CT CHEST LCS NODULE FOLLOW-UP W/O CM   You have to QUIT smoking !!

## 2023-12-25 NOTE — Progress Notes (Signed)
 Full PFT Performed Today

## 2023-12-26 NOTE — Telephone Encounter (Signed)
 Spoke with patient and scheduled lung screening CT at [facility] 01/05/24 at 8:30.

## 2024-01-04 ENCOUNTER — Ambulatory Visit (HOSPITAL_COMMUNITY)
